# Patient Record
Sex: Female | Born: 1945
Health system: Southern US, Community
[De-identification: ages and names within clinical notes are randomized; demographics above are authoritative.]

## PROBLEM LIST (undated history)

## (undated) DIAGNOSIS — E785 Hyperlipidemia, unspecified: Secondary | ICD-10-CM

## (undated) DIAGNOSIS — G894 Chronic pain syndrome: Secondary | ICD-10-CM

## (undated) DIAGNOSIS — K635 Polyp of colon: Secondary | ICD-10-CM

## (undated) DIAGNOSIS — I119 Hypertensive heart disease without heart failure: Secondary | ICD-10-CM

## (undated) DIAGNOSIS — E559 Vitamin D deficiency, unspecified: Secondary | ICD-10-CM

## (undated) DIAGNOSIS — E213 Hyperparathyroidism, unspecified: Secondary | ICD-10-CM

## (undated) DIAGNOSIS — G4730 Sleep apnea, unspecified: Secondary | ICD-10-CM

## (undated) DIAGNOSIS — J309 Allergic rhinitis, unspecified: Secondary | ICD-10-CM

## (undated) DIAGNOSIS — J45909 Unspecified asthma, uncomplicated: Secondary | ICD-10-CM

## (undated) DIAGNOSIS — I10 Essential (primary) hypertension: Secondary | ICD-10-CM

## (undated) DIAGNOSIS — A048 Other specified bacterial intestinal infections: Secondary | ICD-10-CM

## (undated) HISTORY — DX: Polyp of colon: K63.5

## (undated) HISTORY — DX: Vitamin D deficiency, unspecified: E55.9

## (undated) HISTORY — DX: Sleep apnea, unspecified: G47.30

## (undated) HISTORY — DX: Hyperlipidemia, unspecified: E78.5

## (undated) HISTORY — DX: Other specified bacterial intestinal infections: A04.8

## (undated) HISTORY — DX: Morbid (severe) obesity due to excess calories: E66.01

## (undated) HISTORY — DX: Hyperparathyroidism, unspecified: E21.3

## (undated) HISTORY — DX: Chronic pain syndrome: G89.4

## (undated) HISTORY — DX: Hypertensive heart disease without heart failure: I11.9

## (undated) HISTORY — DX: Allergic rhinitis, unspecified: J30.9

## (undated) HISTORY — PX: JOINT REPLACEMENT: SHX530

## (undated) HISTORY — DX: Unspecified asthma, uncomplicated: J45.909

---

## 1998-09-10 ENCOUNTER — Other Ambulatory Visit: Admission: RE | Admit: 1998-09-10 | Discharge: 1998-09-10 | Payer: Self-pay

## 1999-08-27 ENCOUNTER — Encounter: Payer: Self-pay | Admitting: Urology

## 1999-09-03 ENCOUNTER — Inpatient Hospital Stay (HOSPITAL_COMMUNITY): Admission: RE | Admit: 1999-09-03 | Discharge: 1999-09-07 | Payer: Self-pay | Admitting: Orthopedic Surgery

## 1999-10-01 ENCOUNTER — Encounter: Admission: RE | Admit: 1999-10-01 | Discharge: 1999-11-26 | Payer: Self-pay | Admitting: Orthopedic Surgery

## 2000-12-31 ENCOUNTER — Ambulatory Visit (HOSPITAL_COMMUNITY): Admission: RE | Admit: 2000-12-31 | Discharge: 2000-12-31 | Payer: Self-pay | Admitting: Family Medicine

## 2000-12-31 ENCOUNTER — Encounter: Payer: Self-pay | Admitting: Family Medicine

## 2001-04-28 ENCOUNTER — Emergency Department (HOSPITAL_COMMUNITY): Admission: EM | Admit: 2001-04-28 | Discharge: 2001-04-28 | Payer: Self-pay | Admitting: Emergency Medicine

## 2001-04-28 ENCOUNTER — Encounter: Payer: Self-pay | Admitting: Emergency Medicine

## 2002-04-01 ENCOUNTER — Emergency Department (HOSPITAL_COMMUNITY): Admission: EM | Admit: 2002-04-01 | Discharge: 2002-04-01 | Payer: Self-pay | Admitting: *Deleted

## 2003-05-03 ENCOUNTER — Ambulatory Visit (HOSPITAL_COMMUNITY): Admission: RE | Admit: 2003-05-03 | Discharge: 2003-05-03 | Payer: Self-pay | Admitting: Family Medicine

## 2004-05-07 ENCOUNTER — Ambulatory Visit: Payer: Self-pay | Admitting: *Deleted

## 2004-06-10 ENCOUNTER — Ambulatory Visit: Payer: Self-pay | Admitting: Family Medicine

## 2004-07-29 ENCOUNTER — Ambulatory Visit: Payer: Self-pay | Admitting: Family Medicine

## 2004-09-02 ENCOUNTER — Ambulatory Visit: Payer: Self-pay | Admitting: Family Medicine

## 2004-09-08 ENCOUNTER — Ambulatory Visit: Payer: Self-pay | Admitting: Family Medicine

## 2004-09-29 ENCOUNTER — Ambulatory Visit: Payer: Self-pay | Admitting: Family Medicine

## 2004-10-06 ENCOUNTER — Ambulatory Visit: Payer: Self-pay | Admitting: Family Medicine

## 2004-10-13 ENCOUNTER — Encounter: Admission: RE | Admit: 2004-10-13 | Discharge: 2005-01-11 | Payer: Self-pay | Admitting: Family Medicine

## 2005-05-25 ENCOUNTER — Ambulatory Visit: Payer: Self-pay | Admitting: Family Medicine

## 2005-06-08 ENCOUNTER — Ambulatory Visit: Payer: Self-pay | Admitting: Family Medicine

## 2005-06-27 ENCOUNTER — Emergency Department (HOSPITAL_COMMUNITY): Admission: EM | Admit: 2005-06-27 | Discharge: 2005-06-27 | Payer: Self-pay | Admitting: Emergency Medicine

## 2005-07-13 ENCOUNTER — Ambulatory Visit: Payer: Self-pay | Admitting: Family Medicine

## 2005-07-14 ENCOUNTER — Ambulatory Visit (HOSPITAL_COMMUNITY): Admission: RE | Admit: 2005-07-14 | Discharge: 2005-07-14 | Payer: Self-pay | Admitting: *Deleted

## 2005-08-06 ENCOUNTER — Ambulatory Visit (HOSPITAL_COMMUNITY): Admission: RE | Admit: 2005-08-06 | Discharge: 2005-08-06 | Payer: Self-pay | Admitting: Family Medicine

## 2005-08-27 ENCOUNTER — Ambulatory Visit (HOSPITAL_COMMUNITY): Admission: RE | Admit: 2005-08-27 | Discharge: 2005-08-27 | Payer: Self-pay | Admitting: Chiropractic Medicine

## 2005-10-07 ENCOUNTER — Ambulatory Visit (HOSPITAL_COMMUNITY): Admission: RE | Admit: 2005-10-07 | Discharge: 2005-10-08 | Payer: Self-pay | Admitting: Orthopedic Surgery

## 2005-10-20 ENCOUNTER — Encounter: Admission: RE | Admit: 2005-10-20 | Discharge: 2006-01-07 | Payer: Self-pay | Admitting: Orthopedic Surgery

## 2005-12-28 ENCOUNTER — Ambulatory Visit: Payer: Self-pay | Admitting: Family Medicine

## 2006-02-15 ENCOUNTER — Ambulatory Visit: Payer: Self-pay | Admitting: Internal Medicine

## 2006-02-22 ENCOUNTER — Ambulatory Visit: Payer: Self-pay | Admitting: Internal Medicine

## 2006-04-30 ENCOUNTER — Ambulatory Visit: Payer: Self-pay | Admitting: *Deleted

## 2006-07-31 ENCOUNTER — Encounter: Payer: Self-pay | Admitting: Vascular Surgery

## 2006-07-31 ENCOUNTER — Emergency Department (HOSPITAL_COMMUNITY): Admission: EM | Admit: 2006-07-31 | Discharge: 2006-07-31 | Payer: Self-pay | Admitting: Emergency Medicine

## 2006-08-17 ENCOUNTER — Encounter (INDEPENDENT_AMBULATORY_CARE_PROVIDER_SITE_OTHER): Payer: Self-pay | Admitting: Family Medicine

## 2006-08-17 ENCOUNTER — Ambulatory Visit: Payer: Self-pay | Admitting: Family Medicine

## 2006-08-19 ENCOUNTER — Ambulatory Visit (HOSPITAL_COMMUNITY): Admission: RE | Admit: 2006-08-19 | Discharge: 2006-08-19 | Payer: Self-pay | Admitting: Family Medicine

## 2006-12-01 ENCOUNTER — Ambulatory Visit: Payer: Self-pay | Admitting: Internal Medicine

## 2007-04-05 ENCOUNTER — Ambulatory Visit: Payer: Self-pay | Admitting: Family Medicine

## 2007-04-11 DIAGNOSIS — E119 Type 2 diabetes mellitus without complications: Secondary | ICD-10-CM | POA: Insufficient documentation

## 2007-04-11 DIAGNOSIS — I10 Essential (primary) hypertension: Secondary | ICD-10-CM | POA: Insufficient documentation

## 2007-04-11 DIAGNOSIS — M159 Polyosteoarthritis, unspecified: Secondary | ICD-10-CM | POA: Insufficient documentation

## 2007-04-11 DIAGNOSIS — D508 Other iron deficiency anemias: Secondary | ICD-10-CM | POA: Insufficient documentation

## 2007-04-25 ENCOUNTER — Telehealth (INDEPENDENT_AMBULATORY_CARE_PROVIDER_SITE_OTHER): Payer: Self-pay | Admitting: Family Medicine

## 2007-05-18 ENCOUNTER — Encounter (INDEPENDENT_AMBULATORY_CARE_PROVIDER_SITE_OTHER): Payer: Self-pay | Admitting: *Deleted

## 2007-06-29 ENCOUNTER — Ambulatory Visit: Payer: Self-pay | Admitting: Family Medicine

## 2007-07-11 ENCOUNTER — Ambulatory Visit: Payer: Self-pay | Admitting: Family Medicine

## 2007-07-11 DIAGNOSIS — M5430 Sciatica, unspecified side: Secondary | ICD-10-CM | POA: Insufficient documentation

## 2007-07-11 LAB — CONVERTED CEMR LAB
Blood Glucose, Fingerstick: 268
Hgb A1c MFr Bld: 8.7 %

## 2007-07-26 ENCOUNTER — Telehealth (INDEPENDENT_AMBULATORY_CARE_PROVIDER_SITE_OTHER): Payer: Self-pay | Admitting: Family Medicine

## 2007-08-22 ENCOUNTER — Ambulatory Visit: Payer: Self-pay | Admitting: Family Medicine

## 2007-08-22 LAB — CONVERTED CEMR LAB
Blood Glucose, Fingerstick: 140
Blood in Urine, dipstick: NEGATIVE
Glucose, Urine, Semiquant: 100
Nitrite: NEGATIVE
Protein, U semiquant: 30
Specific Gravity, Urine: >=1.03
Urobilinogen, UA: 0.2
WBC Urine, dipstick: NEGATIVE
pH: 5

## 2007-08-23 LAB — CONVERTED CEMR LAB: Pap Smear: NORMAL

## 2007-09-07 ENCOUNTER — Ambulatory Visit (HOSPITAL_COMMUNITY): Admission: RE | Admit: 2007-09-07 | Discharge: 2007-09-07 | Payer: Self-pay | Admitting: Family Medicine

## 2007-09-09 ENCOUNTER — Encounter (INDEPENDENT_AMBULATORY_CARE_PROVIDER_SITE_OTHER): Payer: Self-pay | Admitting: Family Medicine

## 2007-10-24 ENCOUNTER — Ambulatory Visit: Payer: Self-pay | Admitting: Family Medicine

## 2007-10-24 LAB — CONVERTED CEMR LAB
ALT: 15 units/L (ref 0–35)
AST: 15 units/L (ref 0–37)
Albumin: 4.2 g/dL (ref 3.5–5.2)
Alkaline Phosphatase: 80 units/L (ref 39–117)
BUN: 17 mg/dL (ref 6–23)
Basophils Absolute: 0 10*3/uL (ref 0.0–0.1)
Basophils Relative: 1 % (ref 0–1)
CO2: 23 meq/L (ref 19–32)
Calcium: 9.8 mg/dL (ref 8.4–10.5)
Chloride: 105 meq/L (ref 96–112)
Cholesterol: 129 mg/dL (ref 0–200)
Creatinine, Ser: 0.7 mg/dL (ref 0.40–1.20)
Eosinophils Absolute: 0.2 10*3/uL (ref 0.0–0.7)
Eosinophils Relative: 3 % (ref 0–5)
Glucose, Bld: 91 mg/dL (ref 70–99)
HCT: 38.4 % (ref 36.0–46.0)
HDL: 43 mg/dL (ref >39)
Hemoglobin: 11.6 g/dL — ABNORMAL LOW (ref 12.0–15.0)
LDL Cholesterol: 67 mg/dL (ref 0–99)
Lymphocytes Relative: 40 % (ref 12–46)
Lymphs Abs: 2.5 10*3/uL (ref 0.7–4.0)
MCHC: 30.2 g/dL (ref 30.0–36.0)
MCV: 82.2 fL (ref 78.0–100.0)
Monocytes Absolute: 0.5 10*3/uL (ref 0.1–1.0)
Monocytes Relative: 8 % (ref 3–12)
Neutro Abs: 3.1 10*3/uL (ref 1.7–7.7)
Neutrophils Relative %: 49 % (ref 43–77)
Platelets: 273 10*3/uL (ref 150–400)
Potassium: 4.2 meq/L (ref 3.5–5.3)
RBC: 4.67 M/uL (ref 3.87–5.11)
RDW: 15.1 % (ref 11.5–15.5)
Sodium: 142 meq/L (ref 135–145)
Total Bilirubin: 0.4 mg/dL (ref 0.3–1.2)
Total CHOL/HDL Ratio: 3
Total Protein: 7.7 g/dL (ref 6.0–8.3)
Triglycerides: 97 mg/dL (ref <150)
VLDL: 19 mg/dL (ref 0–40)
WBC: 6.3 10*3/uL (ref 4.0–10.5)

## 2007-11-09 ENCOUNTER — Telehealth (INDEPENDENT_AMBULATORY_CARE_PROVIDER_SITE_OTHER): Payer: Self-pay | Admitting: Family Medicine

## 2008-01-25 ENCOUNTER — Telehealth (INDEPENDENT_AMBULATORY_CARE_PROVIDER_SITE_OTHER): Payer: Self-pay | Admitting: Family Medicine

## 2008-08-05 ENCOUNTER — Emergency Department (HOSPITAL_COMMUNITY): Admission: EM | Admit: 2008-08-05 | Discharge: 2008-08-05 | Payer: Self-pay | Admitting: Emergency Medicine

## 2008-09-03 ENCOUNTER — Ambulatory Visit (HOSPITAL_COMMUNITY): Admission: RE | Admit: 2008-09-03 | Discharge: 2008-09-03 | Payer: Self-pay | Admitting: Orthopedic Surgery

## 2008-10-04 ENCOUNTER — Telehealth (INDEPENDENT_AMBULATORY_CARE_PROVIDER_SITE_OTHER): Payer: Self-pay | Admitting: Family Medicine

## 2008-10-22 ENCOUNTER — Ambulatory Visit: Payer: Self-pay | Admitting: Family Medicine

## 2008-10-22 LAB — CONVERTED CEMR LAB
ALT: 14 U/L (ref 0–35)
AST: 15 U/L (ref 0–37)
Albumin: 3.7 g/dL (ref 3.5–5.2)
Alkaline Phosphatase: 107 U/L (ref 39–117)
BUN: 18 mg/dL (ref 6–23)
Basophils Absolute: 0 K/uL (ref 0.0–0.1)
Basophils Relative: 1 % (ref 0–1)
Blood Glucose, Fingerstick: 141
Blood in Urine, dipstick: NEGATIVE
CO2: 20 meq/L (ref 19–32)
Calcium: 9 mg/dL (ref 8.4–10.5)
Chloride: 106 meq/L (ref 96–112)
Creatinine, Ser: 0.82 mg/dL (ref 0.40–1.20)
Eosinophils Absolute: 0.1 K/uL (ref 0.0–0.7)
Eosinophils Relative: 2 % (ref 0–5)
Glucose, Bld: 98 mg/dL (ref 70–99)
Glucose, Urine, Semiquant: 100
HCT: 36.6 % (ref 36.0–46.0)
Hemoglobin: 11.3 g/dL — ABNORMAL LOW (ref 12.0–15.0)
Hgb A1c MFr Bld: 10.5 %
Lymphocytes Relative: 39 % (ref 12–46)
Lymphs Abs: 2.7 K/uL (ref 0.7–4.0)
MCHC: 30.9 g/dL (ref 30.0–36.0)
MCV: 81.9 fL (ref 78.0–100.0)
Monocytes Absolute: 0.6 K/uL (ref 0.1–1.0)
Monocytes Relative: 9 % (ref 3–12)
Neutro Abs: 3.5 K/uL (ref 1.7–7.7)
Neutrophils Relative %: 50 % (ref 43–77)
Nitrite: NEGATIVE
Platelets: 262 K/uL (ref 150–400)
Potassium: 4.4 meq/L (ref 3.5–5.3)
Protein, U semiquant: 100
RBC: 4.47 M/uL (ref 3.87–5.11)
RDW: 14.8 % (ref 11.5–15.5)
Sodium: 142 meq/L (ref 135–145)
Specific Gravity, Urine: >=1.03
TSH: 1.54 u[IU]/mL (ref 0.350–4.50)
Total Bilirubin: 0.3 mg/dL (ref 0.3–1.2)
Total Protein: 7.4 g/dL (ref 6.0–8.3)
Urobilinogen, UA: 0.2
WBC Urine, dipstick: NEGATIVE
WBC: 7 10*3/microliter (ref 4.0–10.5)
pH: 5

## 2008-10-23 LAB — CONVERTED CEMR LAB: Microalb, Ur: 4.09 mg/dL — ABNORMAL HIGH (ref 0.00–1.89)

## 2008-10-29 ENCOUNTER — Ambulatory Visit: Payer: Self-pay | Admitting: Family Medicine

## 2008-10-29 LAB — CONVERTED CEMR LAB
Blood Glucose, Fingerstick: 174
Hgb A1c MFr Bld: 10.7 %

## 2008-11-01 ENCOUNTER — Ambulatory Visit (HOSPITAL_COMMUNITY): Admission: RE | Admit: 2008-11-01 | Discharge: 2008-11-01 | Payer: Self-pay | Admitting: Family Medicine

## 2008-11-01 ENCOUNTER — Encounter (INDEPENDENT_AMBULATORY_CARE_PROVIDER_SITE_OTHER): Payer: Self-pay | Admitting: Family Medicine

## 2008-11-06 ENCOUNTER — Encounter (INDEPENDENT_AMBULATORY_CARE_PROVIDER_SITE_OTHER): Payer: Self-pay | Admitting: Family Medicine

## 2008-11-08 ENCOUNTER — Ambulatory Visit: Payer: Self-pay | Admitting: Family Medicine

## 2008-11-09 ENCOUNTER — Telehealth (INDEPENDENT_AMBULATORY_CARE_PROVIDER_SITE_OTHER): Payer: Self-pay | Admitting: Family Medicine

## 2008-11-16 ENCOUNTER — Telehealth (INDEPENDENT_AMBULATORY_CARE_PROVIDER_SITE_OTHER): Payer: Self-pay | Admitting: Family Medicine

## 2008-11-22 ENCOUNTER — Ambulatory Visit: Payer: Self-pay | Admitting: Family Medicine

## 2008-11-27 ENCOUNTER — Encounter (INDEPENDENT_AMBULATORY_CARE_PROVIDER_SITE_OTHER): Payer: Self-pay | Admitting: Family Medicine

## 2008-12-17 ENCOUNTER — Ambulatory Visit: Payer: Self-pay | Admitting: Family Medicine

## 2008-12-17 DIAGNOSIS — R9431 Abnormal electrocardiogram [ECG] [EKG]: Secondary | ICD-10-CM | POA: Insufficient documentation

## 2008-12-17 LAB — CONVERTED CEMR LAB: Blood Glucose, Fingerstick: 114

## 2008-12-18 ENCOUNTER — Ambulatory Visit (HOSPITAL_COMMUNITY): Admission: RE | Admit: 2008-12-18 | Discharge: 2008-12-18 | Payer: Self-pay | Admitting: Family Medicine

## 2008-12-18 ENCOUNTER — Encounter (INDEPENDENT_AMBULATORY_CARE_PROVIDER_SITE_OTHER): Payer: Self-pay | Admitting: Family Medicine

## 2008-12-18 LAB — CONVERTED CEMR LAB
Cholesterol: 128 mg/dL (ref 0–200)
HDL: 43 mg/dL (ref >39)
LDL Cholesterol: 71 mg/dL (ref 0–99)
Total CHOL/HDL Ratio: 3
Triglycerides: 72 mg/dL (ref <150)
VLDL: 14 mg/dL (ref 0–40)

## 2008-12-19 ENCOUNTER — Telehealth (INDEPENDENT_AMBULATORY_CARE_PROVIDER_SITE_OTHER): Payer: Self-pay | Admitting: Family Medicine

## 2008-12-24 ENCOUNTER — Ambulatory Visit: Payer: Self-pay | Admitting: Cardiovascular Disease

## 2008-12-24 DIAGNOSIS — I10 Essential (primary) hypertension: Secondary | ICD-10-CM | POA: Insufficient documentation

## 2008-12-27 ENCOUNTER — Encounter: Payer: Self-pay | Admitting: Cardiovascular Disease

## 2008-12-27 ENCOUNTER — Ambulatory Visit: Payer: Self-pay

## 2008-12-28 ENCOUNTER — Telehealth (INDEPENDENT_AMBULATORY_CARE_PROVIDER_SITE_OTHER): Payer: Self-pay | Admitting: Family Medicine

## 2009-01-03 ENCOUNTER — Telehealth: Payer: Self-pay | Admitting: Cardiovascular Disease

## 2009-01-03 ENCOUNTER — Encounter (INDEPENDENT_AMBULATORY_CARE_PROVIDER_SITE_OTHER): Payer: Self-pay | Admitting: *Deleted

## 2009-01-21 ENCOUNTER — Ambulatory Visit: Payer: Self-pay | Admitting: Family Medicine

## 2009-01-21 LAB — CONVERTED CEMR LAB
Blood Glucose, Fingerstick: 81
Hgb A1c MFr Bld: 7.4 %

## 2009-03-25 ENCOUNTER — Encounter (INDEPENDENT_AMBULATORY_CARE_PROVIDER_SITE_OTHER): Payer: Self-pay | Admitting: Internal Medicine

## 2009-04-08 ENCOUNTER — Encounter (INDEPENDENT_AMBULATORY_CARE_PROVIDER_SITE_OTHER): Payer: Self-pay | Admitting: Nurse Practitioner

## 2009-04-08 ENCOUNTER — Ambulatory Visit: Payer: Self-pay | Admitting: Physician Assistant

## 2009-04-08 DIAGNOSIS — E785 Hyperlipidemia, unspecified: Secondary | ICD-10-CM | POA: Insufficient documentation

## 2009-04-08 DIAGNOSIS — B351 Tinea unguium: Secondary | ICD-10-CM | POA: Insufficient documentation

## 2009-04-08 DIAGNOSIS — R609 Edema, unspecified: Secondary | ICD-10-CM | POA: Insufficient documentation

## 2009-04-08 LAB — CONVERTED CEMR LAB
Blood Glucose, Fingerstick: 104
Hgb A1c MFr Bld: 6.7 %

## 2009-04-09 ENCOUNTER — Encounter: Payer: Self-pay | Admitting: Physician Assistant

## 2009-04-10 ENCOUNTER — Encounter (INDEPENDENT_AMBULATORY_CARE_PROVIDER_SITE_OTHER): Payer: Self-pay | Admitting: Family Medicine

## 2009-04-10 ENCOUNTER — Ambulatory Visit: Payer: Self-pay | Admitting: Internal Medicine

## 2009-04-10 ENCOUNTER — Ambulatory Visit: Payer: Self-pay | Admitting: Physician Assistant

## 2009-04-15 ENCOUNTER — Encounter: Payer: Self-pay | Admitting: Physician Assistant

## 2009-04-25 ENCOUNTER — Encounter: Payer: Self-pay | Admitting: Physician Assistant

## 2009-04-29 ENCOUNTER — Telehealth: Payer: Self-pay | Admitting: Physician Assistant

## 2009-04-29 DIAGNOSIS — E1139 Type 2 diabetes mellitus with other diabetic ophthalmic complication: Secondary | ICD-10-CM | POA: Insufficient documentation

## 2009-09-04 ENCOUNTER — Telehealth: Payer: Self-pay | Admitting: Physician Assistant

## 2009-09-13 ENCOUNTER — Ambulatory Visit: Payer: Self-pay | Admitting: Physician Assistant

## 2009-09-15 LAB — CONVERTED CEMR LAB
ALT: 15 units/L (ref 0–35)
AST: 14 units/L (ref 0–37)
Albumin: 4 g/dL (ref 3.5–5.2)
Alkaline Phosphatase: 94 units/L (ref 39–117)
BUN: 15 mg/dL (ref 6–23)
CO2: 20 meq/L (ref 19–32)
Calcium: 9.3 mg/dL (ref 8.4–10.5)
Chloride: 104 meq/L (ref 96–112)
Creatinine, Ser: 0.63 mg/dL (ref 0.40–1.20)
Glucose, Bld: 137 mg/dL — ABNORMAL HIGH (ref 70–99)
Hgb A1c MFr Bld: 8.2 % — ABNORMAL HIGH (ref 4.6–6.1)
Potassium: 4.3 meq/L (ref 3.5–5.3)
Sodium: 139 meq/L (ref 135–145)
Total Bilirubin: 0.3 mg/dL (ref 0.3–1.2)
Total Protein: 7.7 g/dL (ref 6.0–8.3)

## 2009-09-24 ENCOUNTER — Telehealth: Payer: Self-pay | Admitting: Physician Assistant

## 2009-09-24 ENCOUNTER — Ambulatory Visit: Payer: Self-pay | Admitting: Physician Assistant

## 2009-09-24 DIAGNOSIS — K029 Dental caries, unspecified: Secondary | ICD-10-CM | POA: Insufficient documentation

## 2009-09-24 LAB — CONVERTED CEMR LAB
Blood Glucose, Fingerstick: 120
Cholesterol, target level: 200 mg/dL
HDL goal, serum: 40 mg/dL
Hgb A1c MFr Bld: 7.6 %
LDL Goal: 100 mg/dL

## 2009-09-25 LAB — CONVERTED CEMR LAB: Microalb, Ur: 2.71 mg/dL — ABNORMAL HIGH (ref 0.00–1.89)

## 2009-10-16 ENCOUNTER — Telehealth: Payer: Self-pay | Admitting: Physician Assistant

## 2009-10-17 ENCOUNTER — Telehealth: Payer: Self-pay | Admitting: Physician Assistant

## 2009-11-15 ENCOUNTER — Telehealth: Payer: Self-pay | Admitting: Physician Assistant

## 2009-11-18 ENCOUNTER — Telehealth: Payer: Self-pay | Admitting: Physician Assistant

## 2009-12-10 ENCOUNTER — Telehealth: Payer: Self-pay | Admitting: Physician Assistant

## 2009-12-13 ENCOUNTER — Telehealth: Payer: Self-pay | Admitting: Physician Assistant

## 2009-12-23 ENCOUNTER — Ambulatory Visit: Payer: Self-pay | Admitting: Physician Assistant

## 2009-12-23 LAB — CONVERTED CEMR LAB
Bilirubin Urine: NEGATIVE
Blood Glucose, Fingerstick: 113
Blood in Urine, dipstick: NEGATIVE
Glucose, Urine, Semiquant: 100
Hgb A1c MFr Bld: 6.8 %
KOH Prep: NEGATIVE
Ketones, urine, test strip: NEGATIVE
Nitrite: NEGATIVE
OCCULT 1: NEGATIVE
Pap Smear: NEGATIVE
Rapid HIV Screen: NEGATIVE
Specific Gravity, Urine: >=1.03
Urobilinogen, UA: 0.2
WBC Urine, dipstick: NEGATIVE
Whiff Test: NEGATIVE
pH: 5

## 2009-12-24 ENCOUNTER — Encounter: Payer: Self-pay | Admitting: Physician Assistant

## 2009-12-25 ENCOUNTER — Encounter: Payer: Self-pay | Admitting: Physician Assistant

## 2009-12-25 LAB — CONVERTED CEMR LAB
ALT: 15 units/L (ref 0–35)
AST: 16 units/L (ref 0–37)
Albumin: 4.1 g/dL (ref 3.5–5.2)
Alkaline Phosphatase: 91 units/L (ref 39–117)
BUN: 21 mg/dL (ref 6–23)
Basophils Absolute: 0 10*3/uL (ref 0.0–0.1)
Basophils Relative: 1 % (ref 0–1)
CO2: 21 meq/L (ref 19–32)
Calcium: 9.5 mg/dL (ref 8.4–10.5)
Chloride: 105 meq/L (ref 96–112)
Creatinine, Ser: 0.82 mg/dL (ref 0.40–1.20)
Eosinophils Absolute: 0.2 10*3/uL (ref 0.0–0.7)
Eosinophils Relative: 2 % (ref 0–5)
Glucose, Bld: 108 mg/dL — ABNORMAL HIGH (ref 70–99)
HCT: 38.9 % (ref 36.0–46.0)
Hemoglobin: 11.9 g/dL — ABNORMAL LOW (ref 12.0–15.0)
Lymphocytes Relative: 34 % (ref 12–46)
Lymphs Abs: 2.7 10*3/uL (ref 0.7–4.0)
MCHC: 30.6 g/dL (ref 30.0–36.0)
MCV: 83.5 fL (ref 78.0–100.0)
Monocytes Absolute: 0.5 10*3/uL (ref 0.1–1.0)
Monocytes Relative: 7 % (ref 3–12)
Neutro Abs: 4.5 10*3/uL (ref 1.7–7.7)
Neutrophils Relative %: 57 % (ref 43–77)
Platelets: 291 10*3/uL (ref 150–400)
Potassium: 4.5 meq/L (ref 3.5–5.3)
RBC: 4.66 M/uL (ref 3.87–5.11)
RDW: 15.8 % — ABNORMAL HIGH (ref 11.5–15.5)
Sodium: 141 meq/L (ref 135–145)
TSH: 1.189 microintl units/mL (ref 0.350–4.500)
Total Bilirubin: 0.3 mg/dL (ref 0.3–1.2)
Total Protein: 7.7 g/dL (ref 6.0–8.3)
WBC: 7.9 10*3/uL (ref 4.0–10.5)

## 2010-01-07 ENCOUNTER — Ambulatory Visit (HOSPITAL_COMMUNITY): Admission: RE | Admit: 2010-01-07 | Discharge: 2010-01-07 | Payer: Self-pay | Admitting: Internal Medicine

## 2010-01-13 ENCOUNTER — Ambulatory Visit (HOSPITAL_COMMUNITY): Admission: RE | Admit: 2010-01-13 | Discharge: 2010-01-13 | Payer: Self-pay | Admitting: Internal Medicine

## 2010-01-15 ENCOUNTER — Telehealth: Payer: Self-pay | Admitting: Physician Assistant

## 2010-01-16 ENCOUNTER — Encounter (INDEPENDENT_AMBULATORY_CARE_PROVIDER_SITE_OTHER): Payer: Self-pay | Admitting: *Deleted

## 2010-01-17 ENCOUNTER — Ambulatory Visit: Payer: Self-pay | Admitting: Physician Assistant

## 2010-01-19 LAB — CONVERTED CEMR LAB
Cholesterol: 120 mg/dL (ref 0–200)
HDL: 40 mg/dL (ref >39)
LDL Cholesterol: 49 mg/dL (ref 0–99)
Total CHOL/HDL Ratio: 3
Triglycerides: 153 mg/dL — ABNORMAL HIGH (ref <150)
VLDL: 31 mg/dL (ref 0–40)

## 2010-01-20 ENCOUNTER — Telehealth (INDEPENDENT_AMBULATORY_CARE_PROVIDER_SITE_OTHER): Payer: Self-pay | Admitting: *Deleted

## 2010-01-22 ENCOUNTER — Encounter: Payer: Self-pay | Admitting: Physician Assistant

## 2010-01-24 ENCOUNTER — Telehealth (INDEPENDENT_AMBULATORY_CARE_PROVIDER_SITE_OTHER): Payer: Self-pay | Admitting: *Deleted

## 2010-02-10 ENCOUNTER — Encounter: Payer: Self-pay | Admitting: Physician Assistant

## 2010-02-10 DIAGNOSIS — M1990 Unspecified osteoarthritis, unspecified site: Secondary | ICD-10-CM | POA: Insufficient documentation

## 2010-02-12 ENCOUNTER — Telehealth: Payer: Self-pay | Admitting: Physician Assistant

## 2010-02-12 ENCOUNTER — Encounter: Payer: Self-pay | Admitting: Physician Assistant

## 2010-02-14 ENCOUNTER — Telehealth: Payer: Self-pay | Admitting: Physician Assistant

## 2010-02-17 ENCOUNTER — Telehealth (INDEPENDENT_AMBULATORY_CARE_PROVIDER_SITE_OTHER): Payer: Self-pay | Admitting: *Deleted

## 2010-03-04 ENCOUNTER — Emergency Department (HOSPITAL_COMMUNITY): Admission: EM | Admit: 2010-03-04 | Discharge: 2010-03-04 | Payer: Self-pay | Admitting: Emergency Medicine

## 2010-03-24 ENCOUNTER — Telehealth: Payer: Self-pay | Admitting: Physician Assistant

## 2010-03-24 ENCOUNTER — Ambulatory Visit: Payer: Self-pay | Admitting: Physician Assistant

## 2010-03-24 DIAGNOSIS — J309 Allergic rhinitis, unspecified: Secondary | ICD-10-CM | POA: Insufficient documentation

## 2010-03-24 LAB — CONVERTED CEMR LAB
Blood Glucose, Fingerstick: 331
Hgb A1c MFr Bld: 11.1 %

## 2010-04-10 ENCOUNTER — Encounter: Payer: Self-pay | Admitting: Physician Assistant

## 2010-04-11 ENCOUNTER — Encounter: Payer: Self-pay | Admitting: Physician Assistant

## 2010-04-21 ENCOUNTER — Ambulatory Visit: Payer: Self-pay | Admitting: Physician Assistant

## 2010-04-21 ENCOUNTER — Telehealth: Payer: Self-pay | Admitting: Physician Assistant

## 2010-04-21 LAB — CONVERTED CEMR LAB: Blood Glucose, Fingerstick: 230

## 2010-04-23 ENCOUNTER — Telehealth: Payer: Self-pay | Admitting: Physician Assistant

## 2010-04-29 ENCOUNTER — Ambulatory Visit: Payer: Self-pay | Admitting: Physician Assistant

## 2010-04-29 DIAGNOSIS — M6530 Trigger finger, unspecified finger: Secondary | ICD-10-CM | POA: Insufficient documentation

## 2010-05-06 ENCOUNTER — Encounter: Payer: Self-pay | Admitting: Physician Assistant

## 2010-05-13 ENCOUNTER — Ambulatory Visit: Payer: Self-pay | Admitting: Physician Assistant

## 2010-05-14 ENCOUNTER — Ambulatory Visit (HOSPITAL_COMMUNITY)
Admission: RE | Admit: 2010-05-14 | Discharge: 2010-05-14 | Payer: Self-pay | Source: Home / Self Care | Admitting: Gastroenterology

## 2010-05-14 ENCOUNTER — Encounter: Payer: Self-pay | Admitting: Physician Assistant

## 2010-05-16 ENCOUNTER — Encounter: Payer: Self-pay | Admitting: Physician Assistant

## 2010-05-16 ENCOUNTER — Ambulatory Visit: Payer: Self-pay | Admitting: Internal Medicine

## 2010-05-19 ENCOUNTER — Encounter: Payer: Self-pay | Admitting: Physician Assistant

## 2010-05-23 ENCOUNTER — Telehealth: Payer: Self-pay | Admitting: Physician Assistant

## 2010-06-18 ENCOUNTER — Ambulatory Visit: Payer: Self-pay | Admitting: Internal Medicine

## 2010-06-23 ENCOUNTER — Telehealth (INDEPENDENT_AMBULATORY_CARE_PROVIDER_SITE_OTHER): Payer: Self-pay | Admitting: Nurse Practitioner

## 2010-07-01 ENCOUNTER — Ambulatory Visit: Payer: Self-pay | Admitting: Internal Medicine

## 2010-07-28 ENCOUNTER — Encounter (INDEPENDENT_AMBULATORY_CARE_PROVIDER_SITE_OTHER): Payer: Self-pay | Admitting: Internal Medicine

## 2010-07-30 ENCOUNTER — Telehealth: Payer: Self-pay | Admitting: Physician Assistant

## 2010-09-02 ENCOUNTER — Telehealth (INDEPENDENT_AMBULATORY_CARE_PROVIDER_SITE_OTHER): Payer: Self-pay | Admitting: Internal Medicine

## 2010-09-20 ENCOUNTER — Encounter: Payer: Self-pay | Admitting: Occupational Therapy

## 2010-09-21 ENCOUNTER — Encounter: Payer: Self-pay | Admitting: Occupational Therapy

## 2010-09-29 ENCOUNTER — Telehealth (INDEPENDENT_AMBULATORY_CARE_PROVIDER_SITE_OTHER): Payer: Self-pay | Admitting: Internal Medicine

## 2010-09-30 NOTE — Progress Notes (Signed)
Summary: CALLING IN FOR HER PAIN MEDS  Phone Note Call from Patient Call back at Home Phone [phone]   Reason for Call: Refill Medication Summary of Call: Katelyn [name] PT. MS [name] IS CALLING IN FOR HER PAIN MEDICATION THAT WILL BE DUE SOON. Initial call taken by: Leodis Rains,  Jan 15, 2010 10:47 AM  Follow-up for Phone Call        forward to provider, last filled on 12-13-09 Follow-up by: [name],  Jan 15, 2010 12:18 PM  Additional Follow-up for Phone Call Additional follow up Details #1::        Rx in basket to fax. Additional Follow-up by: Tereso Newcomer PA-C,  Jan 15, 2010 1:35 PM    Additional Follow-up for Phone Call Additional follow up Details #2::    number is disconnected.... [name]  Jan 15, 2010 4:58 PM  number is disconnected.... will mail letter... [name]  Jan 16, 2010 11:33 AM    Prescriptions: PERCOCET 5-325 MG TABS (OXYCODONE-ACETAMINOPHEN) 1-2 tablets by mouth up to every  8 hours as needed for severe pain  #60 x 0   Entered and Authorized by:   Tereso Newcomer PA-C   Signed by:   Tereso Newcomer PA-C on 01/15/2010   Method used:   Printed then faxed to ...       [facility] - [facility] (retail)       [address] [place], [place]  [address]       Ph: [phone]       Fax: [phone]   RxID:   [id number]

## 2010-09-30 NOTE — Miscellaneous (Signed)
Summary: Has appt with ortho 7.14.2011  Clinical Lists Changes  Problems: Added new problem of OSTEOARTHRITIS (ICD-715.90) Observations: Added new observation of PAST MED HX: diabetes Hypertension Left TKR R rotator cuff surger Abnormal ECG Osteoarthritis   a.  right knee:  eval appt with Dr. Lequita Halt 7.14.2011 (02/10/2010 5:48) Added new observation of PMHOSTEOARTH: yes (02/10/2010 5:48)       Past History:  Past Medical History: diabetes Hypertension Left TKR R rotator cuff surger Abnormal ECG Osteoarthritis   a.  right knee:  eval appt with Dr. Lequita Halt 7.14.2011

## 2010-09-30 NOTE — Progress Notes (Signed)
Summary: GI referral update  Phone Note Outgoing Call   Summary of Call: Katelyn [name] has GI referral in cue for screening colo.  Can we try to get her on list for Dr. Corinda Gubler? Initial call taken by: Brynda Rim,  March 24, 2010 11:22 AM  Follow-up for Phone Call        Called and lt message on Pt's answering machine to offer her an appt, but call was never returned Follow-up by: Candi Leash,  March 24, 2010 11:49 AM  Additional Follow-up for Phone Call Additional follow up Details #1::        [place], Please call [name] and see if we can get her on list for Dr. Corinda Gubler.  She was open to seeing him when I saw her yest. Additional Follow-up by: Brynda Rim,  March 25, 2010 5:43 AM    Additional Follow-up for Phone Call Additional follow up Details #2::    spoke with pt and gave her debra number to speak with her about her referral Follow-up by: [name],  March 25, 2010 10:27 AM

## 2010-09-30 NOTE — Progress Notes (Signed)
Summary: fax surgical clearance information  Phone Note From Other Clinic Call back at [phone]   Caller: [facility] Summary of Call: fax surgical clearance to [facility] fax [phone] attn: [place] Initial call taken by: Burnard Leigh,  Jan 03, 2009 10:48 AM  Follow-up for Phone Call        clearence note faxed Deliah Goody, RN  Jan 03, 2009 11:55 AM

## 2010-09-30 NOTE — Progress Notes (Signed)
  Phone Note From Other Clinic   Summary of Call: pt has a referral for ortho in system... pt would like for you to call her to let her know the prices before you make appt Initial call taken by: [name],  Jan 20, 2010 4:57 PM

## 2010-09-30 NOTE — Progress Notes (Signed)
Summary: NEEDS MEDICAL CLARENCE/Surgical Clearance denied for now  Phone Note Call from Patient Call back at Home Phone [phone]   Summary of Call: Katelyn [name] PT. Katelyn LOBISSERE WANTS TO KNOW IF YOU HAVE GIVEN MEDICAL CLARENCE FOR HER TO HAVE SURGERY W/DR. JEFFE ON HER SHOULDER. Initial call taken by: Leodis Rains,  November 16, 2008 4:46 PM  Follow-up for Phone Call        will forward to Dr Barbaraann Barthel to follow up with after she has reviewed CBG readings and EKG. The surgery is not set yet. Follow-up by: Mikey College CMA,  November 16, 2008 4:56 PM  Additional Follow-up for Phone Call Additional follow up Details #1::        BS look good for surgery but EKG had a few changes that I would like to see if are old(I suspect they are as this was routine baseline EKG). Have you ever had an EKG prior to the 11/08/08 one? If so where and we will try to get it. In meantime set up appt to see me in 2 weeks just to check this out.Marland KitchenMarland KitchenI am sorry to make you wait on surgery but I want to make sure that you are in top shape first...good job with sugars though! Additional Follow-up by: Beverley Fiedler MD,  November 21, 2008 7:44 PM    Additional Follow-up for Phone Call Additional follow up Details #2::    left message to call and ask for [place] or [place]..............Marland KitchenMikey College CMA  November 23, 2008 11:30 AM   Today, Katelyn [name] went to Dr Tinnie Gens, and his office needs a hand written from Dr Barbaraann Barthel that states that she is okay to do a surgery. Marland KitchenManon Hilding  November 23, 2008 3:37 PM   spoke with pt and she is coming in to get another EKG  Follow-up by: [name],  November 23, 2008 3:46 PM

## 2010-09-30 NOTE — Letter (Signed)
Summary: Clearance Letter  [facility], Main Office  [address]   [place], [place] [address]   Phone: [phone]  Fax: [phone]    Jan 03, 2009  Re:     Katelyn [name] Address:   [address]     [place], [place]  [address] DOB:     1946-05-15 MRN:     [id number]   Dear Dr. Everlena Cooper,        Katelyn [name] is considered low risk cardiac wise for surgery. Please contact us with questions or concerns.           Sincerely,  Deliah Goody, RN/Dr. Charlton Haws

## 2010-09-30 NOTE — Progress Notes (Signed)
  Phone Note Call from Patient Call back at [facility] Phone [phone]   Summary of Call: The pt states that the tramadol medication which could be the generic version make her sick (nausea and drouzey) The previous medication was round like a football and the one she got now is different. The pt states that the doze is similar to the previous one.  [facility]) Virginia Francisco PA-C  Initial call taken by: Manon Hilding,  October 16, 2009 11:53 AM  Follow-up for Phone Call        spoke with pt and she let me know that she has been sick since she has been taking the tramadol... pt says the new med looks alot different from the one she has recently taken.... spoke with pharmacy and Burna Mortimer let me know that the med is from another company and thats why its different and it could be making her sick.... spoke with pt and she is aware and will call pharmacy to have them to transferr her med to different pharmacy Follow-up by: [name],  October 16, 2009 3:25 PM

## 2010-09-30 NOTE — Letter (Signed)
Summary: MAILED REQUESTED RECORDS TO [employer]  MAILED REQUESTED RECORDS TO [employer]   Imported By: Arta Bruce 05/19/2010 14:39:17  _____________________________________________________________________  External Attachment:    Type:   Image     Comment:   External Document

## 2010-09-30 NOTE — Progress Notes (Signed)
Summary: Voc Rehab referral  Phone Note From Other Clinic   Caller: Referral Coordinator Summary of Call: Spoke to [facility] @ [phone]   Pt must call to state that she would like to apply for services. Then they would  take an application & set up an orientation. I called Ms.Nare and informed her of this process. She said that she would give tham a call to start the process. Initial call taken by: Candi Leash,  February 14, 2010 1:24 PM  Follow-up for Phone Call        Thanks Follow-up by: Tereso Newcomer PA-C,  February 14, 2010 1:26 PM

## 2010-09-30 NOTE — Progress Notes (Signed)
Summary: Multiple issues/needsto see MD  Phone Note Call from Patient Call back at Home Phone [phone] Call back at [phone]   Caller: Patient Action Taken: Rx Called In Summary of Call: The pt wants to leave a message about her probably surgery with Dr. Everlena Cooper; according to her she have done all the required test an eveything is normal.  The pt said that the physician needs to send a note to Dr Everlena Cooper  that states everything is okay  to have the surgery. Barbaraann Barthel Md.  Initial call taken by: Manon Hilding,  December 28, 2008 4:13 PM  Follow-up for Phone Call        spoke with pt and she said she did all the test Dr. Barbaraann Barthel wanted from her to make sure she was in the clear for the surgery and she said the echo and ekg was normal..... if she is clear can you fax the documents to Dr. Everlena Cooper. Follow-up by: [name],  December 28, 2008 5:48 PM  Additional Follow-up for Phone Call Additional follow up Details #1::        It's Dr.Jeffries. I need to see The cardiologist's evaluation and surgical clearance...please get records. Additional Follow-up by: Beverley Fiedler MD,  Dec 30, 2008 7:21 PM    Additional Follow-up for Phone Call Additional follow up Details #2::    MS Hernon SASY THAT SHE NOW NEEDS A CLEARANCE LETTER SENT TO DR.JAFFE  FOR HER TO HAVE HER SURGERY AS SOON AS POSIBLE. HIS OFFICE NUMBER IS [phone].  ALSO SHE WANTED TO LET YOU KNOW THAT HER LISNOPRIL MAKES HER SWEAT VERY BAD AND SHE EXPERIENCES SHORTNESS OF BREATH. SHE SAYS SHE WENT OUT OF TOWN RECENTLY AND FORGOT THEM, AND THE WHOLE TIME SHE WAS WITHOUT THEM, SHE HAD NO SWEATS NOR SHORTNESS OF BREATH. SHE SAYS THAT SHE TOOK A 1/2 PILL AND ONLY SWEATED A LITTLE AND SHORTNESS OF BREATH.Marland KitchenMarland KitchenCala Bradford Tinnin  Dec 31, 2008 3:08 PM   Additional Follow-up for Phone Call Additional follow up Details #3:: Details for Additional Follow-up Action Taken: Left message on answering machine for pt to call back....Marland KitchenMarland KitchenArmenia Shannon   Dec 31, 2008 5:41 PM  Left message with a lady in the house to have pt return our call..........Marland KitchenArmenia Shannon  Jan 03, 2009 9:16 AM   the information is on your counter......[name] pt is concerned about BP med.   she says she only takes a 1/2 a pill cause she starts sweating.... she said one day she forgot and she did not break out sweating none that day................Marland KitchenArmenia Shannon  Jan 03, 2009 9:56 AM   I have received the letter from Dr.Nishan stating that patient is low cardiac risk.She is also doing well from standpoint of her DM.Marland KitchenMarland KitchenHowever,her BP needs to be controlled and lisinopril should not cause her to sweat or feel SOB....BUT she could feel this way if her blood sugar was too low or dropping and she should check her CBGs when she feels this way. She is having too many issues to address over the phone and needs f/u visit with me...bring all meds and her CBG record. Also,I have the letter from Dr.Nishan's nurse but not the record of his consult/evaluation...is the actual consult note done and could we get it? Just document you spoke to pt and requested recordas and sign off phone note.   Left message on answering machine for pt to call back....Marland KitchenMarland KitchenArmenia Shannon  Jan 04, 2009 11:40 AM spoke with pt  and requested records.......Marland KitchenArmenia Shannon  Jan 07, 2009 10:02 AM  Additional Follow-up by: Beverley Fiedler MD,  Jan 03, 2009 4:17 PM

## 2010-09-30 NOTE — Progress Notes (Signed)
Summary: Address on Thurs 8/25  Phone Note Refill Request   Refills Requested: Medication #1:  PERCOCET 5-325 MG TABS 1-2 tablets by mouth up to every  8 hours as needed for severe pain pt would like to know if she can pick it up today 04-21-10  Initial call taken by: [name],  April 21, 2010 11:15 AM  Follow-up for Phone Call        Cannot get filled until Thursday 8/25. Follow-up by: Tereso Newcomer PA-C,  April 21, 2010 2:05 PM  Additional Follow-up for Phone Call Additional follow up Details #1::        pt is aware and will pick up on thursday Additional Follow-up by: [name],  April 21, 2010 2:39 PM    Additional Follow-up for Phone Call Additional follow up Details #2::    in your basket for pick up Follow-up by: Brynda Rim,  April 24, 2010 3:58 PM  Prescriptions: PERCOCET 5-325 MG TABS (OXYCODONE-ACETAMINOPHEN) 1-2 tablets by mouth up to every  8 hours as needed for severe pain  #60 x 0   Entered and Authorized by:   Tereso Newcomer PA-C   Signed by:   Tereso Newcomer PA-C on 04/24/2010   Method used:   Print then Give to Patient   RxID:   [id number]

## 2010-09-30 NOTE — Assessment & Plan Note (Signed)
Summary: MEDICINE ADJUSMENT//GK   Vital Signs:  Patient profile:   65 year old female Temp:     98.1 degrees F Pulse rate:   99 / minute Pulse rhythm:   regular Resp:     20 per minute BP sitting:   162 / 92  (right arm) Cuff size:   large  Vitals Entered By: Vesta Mixer CMA (April 08, 2009 2:10 PM) CC: pt states she had not been taking metformin correctly and her sugar was high, she was then started on insulin and gets sweats real bad.  She does not really use the insulin any longer either.  She has started taking all her diabetic pills regularly. Is Patient Diabetic? Yes  Pain Assessment Patient in pain? yes     Location: left shoulder Intensity: 7 CBG Result 104  Does patient need assistance? Ambulation Normal   CC:  pt states she had not been taking metformin correctly and her sugar was high and she was then started on insulin and gets sweats real bad.  She does not really use the insulin any longer either.  She has started taking all her diabetic pills regularly.[name]  History of Present Illness: 65 year old female patient previously followed by Dr. Barbaraann Barthel presents for followup.  She had been placed on Levemir insulin earlier in the year.  However, the patient was taking her metformin incorrectly.  She thought that she was doing so well that she decreased her metformin to once daily.  After Dr. Barbaraann Barthel placed her on insulin, she began to take her metformin twice a day again.  After that, she started to have hypoglycemic episodes.  Her sugars sometimes ran the 60s.  She decided to take herself off of insulin.  She has felt well since that time.  Since last being seen, she had left shoulder rotator cuff surgery.  She is slowly recovering from that.  Her surgeon was Dr. Everlena Cooper.  I believe she starts physical therapy next week.  She has been somewhat noncompliant with her diet since she's been home after her surgery.  She does admit to eating more salt.  She has noted some increasing  lower extremity edema.  She denies any significant dyspnea.    Allergies (verified): 1)  ! Motrin 2)  ! Asa 3)  Hydrochlorothiazide  Physical Exam  General:  alert, well-developed, and well-nourished.   Head:  normocephalic and atraumatic.   Neck:  supple.   Lungs:  normal breath sounds.   Heart:  normal rate and regular rhythm.   Abdomen:  soft.   Msk:  left arm splinted to approx. 80 degrees Extremities:  trace left pedal edema and trace right pedal edema.   Neurologic:  alert & oriented X3 and cranial nerves II-XII intact.   Skin:  bilat. feet without sig. callus formation onychomycosis noted several toes bilat.  Psych:  normally interactive.     Impression & Recommendations:  Problem # 1:  DM, UNCOMPLICATED, TYPE II (ICD-250.00) A1C looks great today! Keep on current medications.  Do not think she needs insulin. Will have her A1C check again in about 2-3 months to verify optimal sugar control.   Her updated medication list for this problem includes:    Metformin Hcl 1000 Mg Tabs (Metformin hcl) .[name]... 1 by mouth bid    Actos 45 Mg Tabs (Pioglitazone hcl) .[name]... 1 tablet by mouth daily for diabetes **needs labs**    Januvia 100 Mg Tabs (Sitagliptin phosphate) .[name]... 1 by mouth qam  Orders:  Podiatry Referral (Podiatry)  Problem # 2:  HYPERTENSION (ICD-401.9) Uncontrolled. Increase lisinopril to 40 mg daily. Her updated medication list for this problem includes:    Lasix 20 Mg Tabs (Furosemide) .[name]... Take 1 tablet by mouth once a day  Problem # 3:  ONYCHOMYCOSIS, TOENAILS (ICD-110.1) Refer to podiatry clinic for trimming of nails. Hesitant to place her on lamisil with actos and crestor.  Problem # 4:  EDEMA (ICD-782.3) Recent echo. normal. Take extra lasix as needed for increased edema.  Her updated medication list for this problem includes:    Lasix 20 Mg Tabs (Furosemide) .[name]... Take 1 tablet by mouth once a day  Complete Medication List: 1)  Albuterol 90  Mcg/act Aers (Albuterol) .... 2 puffs q4-6 hrs prn 2)  Metformin Hcl 1000 Mg Tabs (Metformin hcl) .[name].. 1 by mouth bid 3)  Actos 45 Mg Tabs (Pioglitazone hcl) .[name].. 1 tablet by mouth daily for diabetes **needs labs** 4)  Januvia 100 Mg Tabs (Sitagliptin phosphate) .[name].. 1 by mouth qam 5)  Pepcid 20 Mg Tabs (Famotidine) .[name].. 1 by mouth bid 6)  Nasacort Aq 55 Mcg/act Aers (Triamcinolone acetonide(nasal)) .[name].. 1 spray qnostril qday 7)  Allegra 180 Mg Tabs (Fexofenadine hcl) .[name].. 1 by mouth daily 8)  Crestor 5 Mg Tabs (Rosuvastatin calcium) .[name].. 1 by mouth nightly forcholesterol **needs labs before more refills** 9)  Arthrotec 75 75-200 Mg-mcg Tabs (Diclofenac-misoprostol) .[name].. 1 by mouth every 12 hours as needed for pain 10)  Tramadol Hcl 50 Mg Tabs (Tramadol hcl) .[name].. 1-2 tabs by mouth every 6 hours as needed pain 11)  Lisinopril 40 Mg Tabs (lisinopril)  .[name].. 1 by mouth daily for blood pressure 12)  Lasix 20 Mg Tabs (Furosemide) .... Take 1 tablet by mouth once a day  Other Orders: Capillary Blood Glucose/CBG ([address])  Patient Instructions: 1)  Schedule [facility] at [facility]. office. 2)  Check bloodwork in 1 week (BMET ICD-9 401.1) 3)  Please schedule a follow-up appointment in 1 month for blood pressure.  4)  Try to limit use of Arthrotec if possible. Prescriptions: LISINOPRIL 40 MG  TABS (LISINOPRIL) 1 by mouth daily for blood pressure  #30 x 3   Entered and Authorized by:   Tereso Newcomer PA-C   Signed by:   Tereso Newcomer PA-C on 04/08/2009   Method used:   Print then Give to Patient   RxID:   [id number]   Laboratory Results   Blood Tests     HGBA1C: 6.7%   (Normal Range: Non-Diabetic - 3-6%   Control Diabetic - 6-8%) CBG Random:: 104mg /dL

## 2010-09-30 NOTE — Letter (Signed)
Summary: PREP.FOR COLONOSCOPY  PREP.FOR COLONOSCOPY   Imported By: Arta Bruce 04/15/2009 11:23:36  _____________________________________________________________________  External Attachment:    Type:   Image     Comment:   External Document

## 2010-09-30 NOTE — Letter (Signed)
Summary: MAILED REQUESTED RECORDS TO [facility]  MAILED REQUESTED RECORDS TO [facility]   Imported By: Arta Bruce 05/16/2010 15:58:44  _____________________________________________________________________  External Attachment:    Type:   Image     Comment:   External Document

## 2010-09-30 NOTE — Progress Notes (Signed)
  Phone Note Call from Patient Call back at [facility] Phone [phone]   Summary of Call: The pt needs more refills from arthrotec medication. Pt is wondering why she needs to call every other month for this medication.  [facility]).  Alben Spittle PA-c  Initial call taken by: Manon Hilding,  November 18, 2009 3:27 PM  Follow-up for Phone Call        forward to provider Follow-up by: [name],  November 18, 2009 4:03 PM    Prescriptions: ARTHROTEC 75 75-200 MG-MCG  TABS (DICLOFENAC-MISOPROSTOL) 1 by mouth every 12 hours as needed for pain  #60 x 3   Entered and Authorized by:   Tereso Newcomer PA-C   Signed by:   Tereso Newcomer PA-C on 11/18/2009   Method used:   Faxed to ...       [facility] (retail)       [address] [place], [place]  [address]       Ph: [phone]       Fax: [phone]   RxID:   [id number]

## 2010-09-30 NOTE — Assessment & Plan Note (Signed)
Summary: CPP////KT   Vital Signs:  Patient profile:   65 year old female Menstrual status:  postmenopausal Height:      65 inches Weight:      299.2 pounds Temp:     98.1 degrees F Pulse rate:   98 / minute Pulse rhythm:   regular Resp:     18 per minute BP sitting:   148 / 85  (left arm) Cuff size:   large  Vitals Entered By: Arthor Captain (December 23, 2009 10:16 AM) CC: cpp, Hypertension Management Is Patient Diabetic? Yes Pain Assessment Patient in pain? yes     Location: SINUSES AND RT.KNEE Intensity: 8 Type: aching Onset of pain  Chronic CBG Result 113 CBG Device ID B  Does patient need assistance? Functional Status Self care Ambulation Normal     Menstrual Status postmenopausal Last PAP Result NEGATIVE FOR INTRAEPITHELIAL LESIONS OR MALIGNANCY.   Primary Care Provider:  Tereso Newcomer PA-C  CC:  cpp and Hypertension Management.  History of Present Illness: Here for CPP.  Health maint: No h/o abnormal paps. No vaginal bleeding.  Postmenopausal for about 3 years.  Notes some mood swings, bloating and hot flashes.  No discharge, odor or burning. C/o yeast infection.   Has treated herself with over the counter medications. Never had abnormal mammo. Due for colonoscopy. Takes one a day vitamin.     Knee pain:  Right knee. . . stiff in the morning.  Feels like it is locking up at times.  Was seeing a personal trainer 1-2 years ago.  Had a sudden pain in knee 1 1/2 years ago.  "I felt like everything was unraveling in my knee."  Went to the ED.  Record of an xray in 2007 with mild DJD.  She states she fells a couple weeks ago.  Did not have cane at the time.  States her leg crosses over to the other side while she is walking and she falls or comes close to falling.    Hypertension History:      She denies chest pain and syncope.  She notes no problems with any antihypertensive medication side effects.        Positive major cardiovascular risk factors include  female age 87 years old or older, diabetes, hyperlipidemia, and hypertension.  Negative major cardiovascular risk factors include negative family history for ischemic heart disease and non-tobacco-user status.        Further assessment for target organ damage reveals no history of ASHD, stroke/TIA, or peripheral vascular disease.     Problems Prior to Update: 1)  Family History Diabetes 1st Degree Relative  (ICD-V18.0) 2)  Dental Caries  (ICD-521.00) 3)  Diabetic Retinopathy  (ICD-250.50) 4)  Edema  (ICD-782.3) 5)  Onychomycosis, Toenails  (ICD-110.1) 6)  Dyslipidemia  (ICD-272.4) 7)  Electrocardiogram, Abnormal  (ICD-794.31) 8)  Aodm  (ICD-250.00) 9)  Preoperative Examination  (ICD-V72.84) 10)  Hypertension  (ICD-401.9) 11)  Electrocardiogram, Abnormal  (ICD-794.31) 12)  Screening For Mlig Neop, Breast, Nos  (ICD-V76.10) 13)  Screening For Malignant Neoplasm, Cervix  (ICD-V76.2) 14)  Examination, Routine Medical  (ICD-V70.0) 15)  Sciatica, Right  (ICD-724.3) 16)  Dm, Uncomplicated, Type II  (ICD-250.00) 17)  Hypertension, Benign Essential  (ICD-401.1) 18)  Anemia Due To Dietary Iron Deficiency  (ICD-280.1) 19)  Osteoarthritis, Generalized  (ICD-715.00)  Allergies (verified): 1)  ! Motrin 2)  ! Asa 3)  Hydrochlorothiazide  Past History:  Past Medical History: Last updated: 12/24/2008 diabetes Hypertension Left TKR R  rotator cuff surger Abnormal ECG  Past Surgical History: Left knee replacement orthopedic Procedure  s/p rotator cuff surgery bilaterally  Family History: noncontributary No FHx of breast or colon cancer. 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  Non-smoker Non-drinker  Review of Systems      See HPI General:  Denies chills and fever. ENT:  Complains of sinus pressure. CV:  Denies chest pain or  discomfort and fainting. Resp:  Denies cough. GI:  Denies bloody stools and dark tarry stools. GU:  Denies dysuria. MS:  Complains of joint pain. Derm:  Denies rash. Endo:  Complains of heat intolerance.  Physical Exam  General:  alert, well-developed, and well-nourished.   Head:  normocephalic and atraumatic.   Eyes:  pupils equal, pupils round, and pupils reactive to light.   fundi difficult to visualize  Ears:  R ear normal and L ear normal.   Nose:  no external deformity.   Mouth:  pharynx pink and moist.   Neck:  supple, no thyromegaly, no JVD, no carotid bruits, and no cervical lymphadenopathy.   Lungs:  normal breath sounds, no crackles, and no wheezes.   Heart:  normal rate and regular rhythm.   Abdomen:  soft, non-tender, and normal bowel sounds.   Msk:  left knee TKR scar right knee:  + crepitus; neg McMurray, neg ant drawer, no eff  Extremities:  no edema  Neurologic:  alert & oriented X3 and cranial nerves II-XII intact.   Skin:  turgor normal.   Psych:  normally interactive and good eye contact.     Impression & Recommendations:  Problem # 1:  EXAMINATION, ROUTINE MEDICAL (ICD-V70.0)  pap today schedule screening colo stool cards schedule mammo never did PHQ9 today. . . get at next visit   Orders: Mammogram (Screening) (Mammo) Hemoccult Cards -3 specimans (take home) (03474) Hemoccult Guaiac-1 spec.(in office) (82270) T-Pap Smear, Thin Prep (25956) T-Urinalysis (38756-43329) T-CBC w/Diff (51884-16606) T-HIV Antibody  (Reflex) (30160-10932) T-TSH (35573-22025) Gastroenterology Referral (GI)  Problem # 2:  DIABETIC  RETINOPATHY (ICD-250.50) has seen eye doctor says everything ok got reading glasses  Her updated medication list for this problem includes:    Metformin Hcl 1000 Mg Tabs (Metformin hcl) ..... One tablet by mouth two times a day for blood sugar    Actos 45 Mg Tabs (Pioglitazone hcl) .[name]... 1 tablet by mouth daily for diabetes     Januvia 100 Mg Tabs (Sitagliptin phosphate) ..... One tablet by mouth daily for blood sugar  Problem # 3:  DYSLIPIDEMIA (ICD-272.4) needs fasting lipids  Her updated medication list for this problem includes:    Crestor 5 Mg Tabs (Rosuvastatin calcium) .[name]... 1 by mouth nightly forcholesterol **needs labs before more refills**  Problem # 4:  HYPERTENSION (ICD-401.9) change amlodipine ot 10 mg to get to goal (< 130/80)  Her updated medication list for this problem includes:    Lasix 20 Mg Tabs (Furosemide) .[name]... Take 1 tablet by mouth once a day    Amlodipine Besylate 10 Mg Tabs (Amlodipine besylate) .[name]... Take 1 tablet by mouth once a day for blood pressure (pharmacy note change in dose)  Orders: T-Comprehensive Metabolic Panel (42706-23762) T-TSH [phone])  Problem # 5:  SCREENING FOR MALIGNANT NEOPLASM, CERVIX (ICD-V76.2)  Orders: KOH/ WET [place] [phone]) T-Pap Smear, Thin Prep (62694)  Problem # 6:  DM, UNCOMPLICATED, TYPE II (ICD-250.00)  A1C now below 7  cont current meds  Her updated medication list for this problem includes:    Metformin Hcl 1000 Mg Tabs (Metformin hcl) ..... One tablet by mouth two times a day for blood sugar    Actos 45 Mg Tabs (Pioglitazone hcl) .[name]... 1 tablet by mouth daily for diabetes    Januvia 100 Mg Tabs (Sitagliptin phosphate) ..... One tablet by mouth daily for blood sugar  Orders: Capillary Blood Glucose/CBG (16109) T-Comprehensive Metabolic Panel (60454-09811)  Problem # 7:  OSTEOARTHRITIS, GENERALIZED (ICD-715.00) never got right knee xray send for xray consider steroid injection vs. referral to PT  The following medications were removed from the medication list:    Tramadol Hcl 50 Mg Tabs (Tramadol hcl) .[name]... 1-2 tabs by mouth every 6 hours as needed pain Her updated medication list for this problem includes:    Arthrotec 75 75-200 Mg-mcg Tabs (Diclofenac-misoprostol) .[name]... 1 by mouth every 12 hours as needed for pain    Percocet  5-325 Mg Tabs (Oxycodone-acetaminophen) .[name]... 1-2 tablets by mouth up to every  8 hours as needed for severe pain  Complete Medication List: 1)  Proventil Hfa 108 (90 Base) Mcg/act Aers (Albuterol sulfate) .[name].. 1-2 puffs every 4-6 hours as needed 2)  Metformin Hcl 1000 Mg Tabs (Metformin hcl) .... One tablet by mouth two times a day for blood sugar 3)  Actos 45 Mg Tabs (Pioglitazone hcl) .[name].. 1 tablet by mouth daily for diabetes 4)  Januvia 100 Mg Tabs (Sitagliptin phosphate) .... One tablet by mouth daily for blood sugar 5)  Pepcid 20 Mg Tabs (Famotidine) .[name].. 1 by mouth bid 6)  Nasacort Aq 55 Mcg/act Aers (Triamcinolone acetonide(nasal)) .[name].. 1 spray qnostril qday 7)  Allegra 180 Mg Tabs (Fexofenadine hcl) .[name].. 1 by mouth daily 8)  Crestor 5 Mg Tabs (Rosuvastatin calcium) .[name].. 1 by mouth nightly forcholesterol **needs labs before more refills** 9)  Arthrotec 75 75-200 Mg-mcg Tabs (Diclofenac-misoprostol) .[name].. 1 by mouth every 12 hours as needed for pain 10)  Lisinopril 40 Mg Tabs (lisinopril)  .[name].. 1 by mouth daily for blood pressure 11)  Lasix 20 Mg Tabs (Furosemide) .... Take 1 tablet by mouth once a day 12)  Amlodipine Besylate 10 Mg Tabs (Amlodipine besylate) .... Take 1 tablet by mouth once a day for blood pressure (pharmacy note change in dose) 13)  Percocet 5-325 Mg Tabs (Oxycodone-acetaminophen) .[name].. 1-2 tablets by mouth up to every  8 hours as needed for severe pain 14)  Caltrate 600+d Plus 600-400 Mg-unit Tabs (Calcium carbonate-vit d-min) .... Take 1 tablet by mouth two times a day  Other Orders: T- Hemoglobin A1C ([id number])  Hypertension Assessment/Plan:      The patient's hypertensive risk group is category C: Target organ damage and/or diabetes.  Her calculated 10 year risk of coronary heart disease is 24 %.  Today's blood pressure is 148/85.  Her blood pressure goal is < 130/80.  Patient Instructions: 1)  You should take calcium.  You need 600 mg of calcium with Vit. D  400 International Units two times a day. 2)  Return fasting in the next 2-3 weeks for Lipids.  Do not eat or drink anything after midnight except water. 3)  Get your knee xray. 4)  Please schedule a follow-up appointment in 3 months with Jiyaan Steinhauser for diabetes and high blood pressure.  Prescriptions: AMLODIPINE BESYLATE 10 MG TABS (AMLODIPINE BESYLATE) Take 1 tablet by mouth once a day for blood pressure (pharmacy note change in dose)  #  30 x 5   Entered and Authorized by:   Tereso Newcomer PA-C   Signed by:   Tereso Newcomer PA-C on 12/23/2009   Method used:   Faxed to ...       [facility] (retail)       [address] [place], [place]  [address]       Ph: [phone]       Fax: [phone]   RxID:   [id number] CALTRATE 600+D PLUS 600-400 MG-UNIT TABS (CALCIUM CARBONATE-VIT D-MIN) Take 1 tablet by mouth two times a day  #60 x 11   Entered and Authorized by:   Tereso Newcomer PA-C   Signed by:   Tereso Newcomer PA-C on 12/23/2009   Method used:   Faxed to ...       [facility] - [facility] (retail)       [address] [place], [place]  [address]       Ph: [phone]       Fax: [phone]   RxID:   [id number]   Laboratory Results   Urine Tests  Date/Time Received: December 23, 2009 10:37 AM  Date/Time Reported: December 23, 2009 10:37 AM   Routine Urinalysis   Color: lt. yellow Appearance: Clear Glucose: 100   (Normal Range: Negative) Bilirubin: negative   (Normal Range: Negative) Ketone: negative   (Normal Range: Negative) Spec. Gravity: >=1.030   (Normal Range: 1.003-1.035) Blood: negative   (Normal Range: Negative) pH: 5.0   (Normal Range: 5.0-8.0) Protein: trace   (Normal Range: Negative) Urobilinogen: 0.2   (Normal Range: 0-1) Nitrite: negative   (Normal Range: Negative) Leukocyte Esterace: negative   (Normal Range: Negative)     Blood Tests   Date/Time Received: December 23, 2009 10:27  AM  Date/Time Reported: December 23, 2009 10:27 AM   HGBA1C: 6.8%   (Normal Range: Non-Diabetic - 3-6%   Control Diabetic - 6-8%) CBG Random:: 113mg /dL    [employer] [place] Source: vaginal WBC/hpf: 1-5 Bacteria/hpf: rare Clue cells/hpf: none  Negative whiff Yeast/hpf: none Wet Mount KOH: Negative Trichomonas/hpf: none  Other Tests  Rapid HIV: negative  Stool - Occult Blood Hemmoccult #1: negative Date: 12/23/2009      Appended Document: CPP////KT    Clinical Lists Changes  Observations: Added new observation of GU EXAM GEN: normal introitus, no external lesions, no vaginal discharge, mucosa pink and moist, no vaginal or cervical lesions, no vaginal atrophy, and no friaility or hemorrhage.   body habitus limited ability to completely eval fundus and adnexae (12/25/2009 12:26) Added new observation of PEADULT: Tereso Newcomer PA-C ~Breasts`Breast Insp ~Genitalia`GU exam gen (12/25/2009 12:26) Added new observation of BREAST INSP: skin/areolae normal, no masses, no abnormal thickening, no nipple discharge, no tenderness, and no adenopathy.   (12/25/2009 12:26)        Physical Exam  Breasts:  skin/areolae normal, no masses, no abnormal thickening, no nipple discharge, no tenderness, and no adenopathy.   Genitalia:  normal introitus, no external lesions, no vaginal discharge, mucosa pink and moist, no vaginal or cervical lesions, no vaginal atrophy, and no friaility or hemorrhage.   body habitus limited ability to completely eval fundus and adnexae

## 2010-09-30 NOTE — Assessment & Plan Note (Signed)
Summary: BP FU//////KT   Vital Signs:  Patient profile:   65 year old female Height:      65 inches Weight:      280 pounds BMI:     46.76 Temp:     97.8 degrees F oral Pulse rate:   72 / minute Pulse rhythm:   regular Resp:     18 per minute BP sitting:   142 / 86  (left arm) Cuff size:   large  Vitals Entered By: [name] (Jan 21, 2009 3:06 PM) Is Patient Diabetic? Yes  Pain Assessment Patient in pain? no      CBG Result 81  Does patient need assistance? Ambulation Normal   History of Present Illness: Here for final surgical clearance. Has BS record...fasting 92-122 and rest of day 98-124. Needs Surgical clearance,Dr.Jaffe.  Her last HgA1c was 7.%. BP now much improved. She has seen Dr.Nishan,cardiologist, and been cleared for surgery.Notes reviewed.  She had a cold/laryngitis last week but is much improved now.  Her EMR med list had duplicates listed. All meds reviewed with patient and duplicates removed.  Allergies (verified): 1)  ! Motrin 2)  ! Asa 3)  Hydrochlorothiazide  Past History:  Past Medical History:    Reviewed history from 12/24/2008 and no changes required:    diabetes    Hypertension    Left TKR    R rotator cuff surger    Abnormal ECG  Past Surgical History:    Reviewed history from 12/20/2008 and no changes required:     knee replacement    orthopedic Procedure   Social History:    Reviewed history from 12/24/2008 and no changes required:       Single       One older daughter       Works as a Licensed conveyancer.       [employer]       Non-smoker       Non-drinker  Physical Exam  General:  Well-developed,well-nourished,in no acute distress; alert,appropriate and cooperative throughout examination Lungs:  Normal respiratory effort, chest expands symmetrically. Lungs are clear to auscultation, no crackles or wheezes. Heart:  Normal rate and regular rhythm. S1 and S2 normal without gallop, murmur, click, rub or other  extra sounds.   Impression & Recommendations:  Problem # 1:  DM, UNCOMPLICATED, TYPE II (ICD-250.00) HgA1c=7.4%. Her updated medication list for this problem includes:    Metformin Hcl 1000 Mg Tabs (Metformin hcl) .[name]... 1 by mouth bid    Actos 45 Mg Tabs (Pioglitazone hcl) .[name]... 1 tablet by mouth daily for diabetes **needs labs**    Januvia 100 Mg Tabs (Sitagliptin phosphate) .[name]... 1 by mouth qam    Lisinopril 20 Mg Tabs (Lisinopril) .[name]... 1 by mouth daily for blood pressure  Problem # 2:  HYPERTENSION (ICD-401.9) Much improved from 4/19 value of 192/100.  Her updated medication list for this problem includes:    Lisinopril 20 Mg Tabs (Lisinopril) .[name]... 1 by mouth daily for blood pressure    Lasix 20 Mg Tabs (Furosemide) .[name]... Take 1 tablet by mouth once a day  Problem # 3:  ANEMIA DUE TO DIETARY IRON DEFICIENCY (ICD-280.1) Resolved per 10/2008 CBC.  Complete Medication List: 1)  Albuterol 90 Mcg/act Aers (Albuterol) .... 2 puffs q4-6 hrs prn 2)  Metformin Hcl 1000 Mg Tabs (Metformin hcl) .[name].. 1 by mouth bid 3)  Actos 45 Mg Tabs (Pioglitazone hcl) .[name].. 1 tablet by mouth daily for diabetes **needs labs**  4)  Januvia 100 Mg Tabs (Sitagliptin phosphate) .[name].. 1 by mouth qam 5)  Pepcid 20 Mg Tabs (Famotidine) .[name].. 1 by mouth bid 6)  Nasacort Aq 55 Mcg/act Aers (Triamcinolone acetonide(nasal)) .[name].. 1 spray qnostril qday 7)  Allegra 180 Mg Tabs (Fexofenadine hcl) .[name].. 1 by mouth daily 8)  Crestor 5 Mg Tabs (Rosuvastatin calcium) .[name].. 1 by mouth nightly forcholesterol **needs labs before more refills** 9)  Arthrotec 75 75-200 Mg-mcg Tabs (Diclofenac-misoprostol) .[name].. 1 by mouth every 12 hours as needed for pain 10)  Tramadol Hcl 50 Mg Tabs (Tramadol hcl) .[name].. 1-2 tabs by mouth every 6 hours as needed pain 11)  Lisinopril 20 Mg Tabs (Lisinopril) .[name].. 1 by mouth daily for blood pressure 12)  Lasix 20 Mg Tabs (Furosemide) .... Take 1 tablet by mouth once a day  Patient Instructions: 1)   Dr.Jaffe,[facility],#[phone]. 2)  You are medically cleared for shoulder replacement surgery.    Laboratory Results   Blood Tests   Date/Time Received: Jan 21, 2009 3:08 PM   HGBA1C: 7.4%   (Normal Range: Non-Diabetic - 3-6%   Control Diabetic - 6-8%) CBG Random:: 81mg /dL

## 2010-09-30 NOTE — Progress Notes (Signed)
Summary: Office Visit - podiatry  Office Visit - podiatry   Imported By: Paula Libra 04/12/2009 13:18:59  _____________________________________________________________________  External Attachment:    Type:   Image     Comment:   External Document

## 2010-09-30 NOTE — Letter (Signed)
Summary: PREP FOR COLONOSCOPY  PREP FOR COLONOSCOPY   Imported By: Arta Bruce 05/26/2010 16:15:04  _____________________________________________________________________  External Attachment:    Type:   Image     Comment:   External Document

## 2010-09-30 NOTE — Miscellaneous (Signed)
Summary: VIP  Patient: Katelyn [name] Note: All result statuses are Final unless otherwise noted.  Tests: (1) VIP (Medications)   LLIMPORTMEDS              "Result Below..."       RESULT: VENTOLIN HFA AERS 108 (90 Base) MCG/ACT*2 PUFFS EVERY 4 TO 6 HOURS AS NEEDED*02/09/2007*Last Refill: [id number]*******   LLIMPORTMEDS              "Result Below..."       RESULT: TRIAMCINOLONE ACETONIDE OINT 0.1 %*APPLY TO AFFECTED AREA(S) 2 TIMES DAILY UNTIL CLEAR.*02/09/2007*Last Refill: 04/07/2007*[id number]*******   LLIMPORTMEDS              "Result Below..."       RESULT: TRAMADOL HCL TABS 50 MG*TAKE 1 TO 2 TABLETS EVERY 6 HOURS AS NEEDED FOR PAIN*03/10/2007*Last Refill: 06/06/2007*[id number]*******   LLIMPORTMEDS              "Result Below..."       RESULT: NASACORT AQ AERS 55 MCG/ACT*USE ONE SPRAY IN EACH NOSTRIL TWICE A DAY*04/07/2007*Last Refill: 06/06/2007*[id number]*******   LLIMPORTMEDS              "Result Below..."       RESULT: MICONAZOLE NITRATE CREA 2 %*APPLY TO SKIN RASH AS DIRECTED*04/07/2007*Last Refill: [id number]*******   LLIMPORTMEDS              "Result Below..."       RESULT: LISINOPRIL TABS 20 MG*TAKE ONE (1) TABLET EVERY MORNING*03/10/2007*Last Refill: 06/06/2007*[id number]*******   LLIMPORTMEDS              "Result Below..."       RESULT: JANUVIA TABS 100 MG*TAKE ONE (1) TABLET EVERY MORNING*02/09/2007*Last Refill: 05/05/2007*[id number]*******   LLIMPORTMEDS              "Result Below..."       RESULT: GLUCOPHAGE TABS 1000 MG*TAKE ONE (1) TABLET TWICE DAILY*02/09/2007*Last Refill: 06/06/2007*[id number]*******   LLIMPORTMEDS              "Result Below..."       RESULT: FUROSEMIDE TABS 20 MG*TAKE ONE (1) TABLET EACH DAY   Generic for LASIX 20MG  TAB*04/28/2007*Last Refill: 06/06/2007*[id number]*******   LLIMPORTMEDS              "Result Below..."       RESULT: FAMOTIDINE TABS 20 MG*TAKE ONE (1) TABLET TWICE DAILY*02/09/2007*Last Refill: 06/06/2007*[id number]*******   LLIMPORTMEDS              "Result Below..."       RESULT: DIFLUCAN TABS 100 MG*TAKE ONE AND A HALF TABLETS (150MG ) EACH DAY  FOR 5 DAYS*04/07/2007*Last Refill: [id number]*******   LLIMPORTMEDS              "Result Below..."       RESULT: CRESTOR TABS 5 MG*TAKE ONE (1) TABLET BY MOUTH EVERY DAY*03/10/2007*Last Refill: 06/06/2007*[id number]*******   LLIMPORTMEDS              "Result Below..."       RESULT: ARTHROTEC 75 TABS 75-200 MG-MCG*TAKE ONE TABLET BY MOUTH EVERY 12 HOURS AS NEEDED FOR PAIN*02/28/2007*Last Refill: 06/06/2007*[id number]*******   LLIMPORTMEDS              "Result Below..."       RESULT: ALLEGRA TABS 180 MG*TAKE ONE (1) TABLET BY MOUTH EVERY DAY*03/10/2007*Last Refill: 06/06/2007*[id number]*******   LLIMPORTMEDS              "Result Below..."       RESULT: ACTOS TABS 45  MG*TAKE ONE (1) TABLET EVERY MORNING*03/10/2007*Last Refill: 06/06/2007*[id number]*******   LLIMPORTALLS              "Result Below..."       RESULT: ASPIRIN UXNA*[id number]**  Note: An exclamation mark (!) indicates a result that was not dispersed into the flowsheet. Document Creation Date: 06/30/2007 3:02 PM _______________________________________________________________________  (1) Order result status: Final Collection or observation date-time: 05/18/2007 Requested date-time: 05/18/2007 Receipt date-time:  Reported date-time: 05/18/2007 Referring Physician:   Ordering Physician:   Specimen Source:  Source: [place] Order Number:  Lab site:

## 2010-09-30 NOTE — Letter (Signed)
Summary: PROJECT ACCESS  PROJECT ACCESS   Imported By: Arta Bruce 09/21/2007 15:59:04  _____________________________________________________________________  External Attachment:    Type:   Image     Comment:   External Document

## 2010-09-30 NOTE — Assessment & Plan Note (Signed)
Summary: trigger finger injection///bc   Vital Signs:  Patient profile:   65 year old female Menstrual status:  postmenopausal Height:      65 inches Weight:      301.7 pounds BP sitting:   130 / 78  (left arm) Cuff size:   large  Vitals Entered By: Michelle Nasuti (May 16, 2010 3:37 PM)  Primary Care Provider:  Tereso Newcomer PA-C   History of Present Illness: Pt. here for injection of right 5th trigger finger.  Allergies: 1)  ! Motrin 2)  ! Asa 3)  Hydrochlorothiazide  Physical Exam  Extremities:  Area of swelling/pulley formation found.  1/2 cc of 1% lidocaine and 1 ml of 40 mg/ml of Depomedrol injected to area after thoroughly with betadine.  Good hemostasis.  Pt. tolerated procedure well without complication.  Splinted straight.   Impression & Recommendations:  Problem # 1:  TRIGGER FINGER (ICD-727.03)  Orders: Joint Aspirate / Injection, Small ([address])  Complete Medication List: 1)  Proventil Hfa 108 (90 Base) Mcg/act Aers (Albuterol sulfate) .[name].. 1-2 puffs every 4-6 hours as needed 2)  Metformin Hcl 1000 Mg Tabs (Metformin hcl) .... One tablet by mouth two times a day for blood sugar 3)  Actos 45 Mg Tabs (Pioglitazone hcl) .[name].. 1 tablet by mouth daily for diabetes 4)  Januvia 100 Mg Tabs (Sitagliptin phosphate) .... One tablet by mouth daily for blood sugar 5)  Pepcid 20 Mg Tabs (Famotidine) .[name].. 1 by mouth bid 6)  Nasacort Aq 55 Mcg/act Aers (Triamcinolone acetonide(nasal)) .[name].. 1 spray qnostril qday 7)  Allegra 180 Mg Tabs (Fexofenadine hcl) .[name].. 1 by mouth daily 8)  Crestor 5 Mg Tabs (Rosuvastatin calcium) .[name].. 1 by mouth nightly forcholesterol **needs labs before more refills** 9)  Arthrotec 75 75-200 Mg-mcg Tabs (Diclofenac-misoprostol) .[name].. 1 by mouth every 12 hours as needed for pain 10)  Lisinopril 40 Mg Tabs (lisinopril)  .[name].. 1 by mouth daily for blood pressure 11)  Lasix 20 Mg Tabs (Furosemide) .... Take 1 tablet by mouth once a day 12)  Amlodipine  Besylate 10 Mg Tabs (Amlodipine besylate) .... Take 1 tablet by mouth once a day for blood pressure (pharmacy note change in dose) 13)  Percocet 5-325 Mg Tabs (Oxycodone-acetaminophen) .[name].. 1-2 tablets by mouth up to every  8 hours as needed for severe pain 14)  Caltrate 600+d Plus 600-400 Mg-unit Tabs (Calcium carbonate-vit d-min) .... Take 1 tablet by mouth two times a day 15)  Fish Oil 1000 Mg Caps (Omega-3 fatty acids) .... Take 1 capsule by mouth once a day 16)  Aspirin 81 Mg Tabs (Aspirin) .... Take 1 tablet by mouth once a day 17)  Lantus Solostar 100 Unit/ml Soln (Insulin glargine) .... Inject 10 units at bedtime  Patient Instructions: 1)  Call and be seen immediately for redness, swelling, pain of area. 2)  Follow up with Dr. Delrae Alfred in 2 weeks --recheck of trigger finger. 3)  Keep finger splinted at all times, unless washing hands or showering--try to keep finger straight during those times as well.

## 2010-09-30 NOTE — Progress Notes (Signed)
Summary: Wants updates on dental and colonoscopy referrals  Phone Note Call from Patient   Reason for Call: Referral Summary of Call: Wants to know status of dental referral and colonoscopy -- states that her lower molar is bothering her.  Initial call taken by: Dutch Quint RN,  February 17, 2010 11:54 AM  Follow-up for Phone Call        dental will contact pt I will contact Pt in July or August for Colonoscopy Follow-up by: Candi Leash,  February 17, 2010 12:40 PM  Additional Follow-up for Phone Call Additional follow up Details #1::        Spoke with pt. and advised of referrals in progress -- verbalized understanding.  Dutch Quint RN  February 17, 2010 2:41 PM  Additional Follow-up by: Dutch Quint RN,  February 17, 2010 2:41 PM

## 2010-09-30 NOTE — Progress Notes (Signed)
  Phone Note Other Incoming   Summary of Call: pt has a referral for colonoscopy Initial call taken by: [name],  Jan 24, 2010 10:16 AM

## 2010-09-30 NOTE — Progress Notes (Signed)
Summary: phone note  Phone Note Call from Patient   Caller: Patient Reason for Call: Refill Medication Summary of Call: THE PT IS TRYING TO GET MORE REFILLS FROM HER FLUID MEDICATIONS; SHE CALLED AT THE [facility] BUT THEY TOLD HER THAT IN RECORD THAT MEDICATION IS NOT LISTED.  PLEASE CALL HER BACK Initial call taken by: Manon Hilding,  April 25, 2007 12:44 PM  Follow-up for Phone Call        pt had been taking lasix from an old script, unable to tolerate HCTZ, requesting refill of lasix  Additional Follow-up for Phone Call Additional follow up Details #1::        Phone Call Completed, Rx Called In Pt prev on lasix 12/07.Pt requested trial of HCTZ. OK to switch back to lasix 20 mg 1 by mouth qday.#30. 4RF.MD called to [facility] and left message for pt at home #. 12pm VR

## 2010-09-30 NOTE — Progress Notes (Signed)
Summary: pain medication   Phone Note Call from Patient Call back at Home Phone [phone]   Caller: Patient Summary of Call: the patient will call the [facility] for more refills in [facility]ocaste med.  She said that the physician told her that when she needs more medication she just need to call in for get more refills. Dr. Barbaraann Barthel Initial call taken by: Manon Hilding,  July 26, 2007 3:59 PM  Follow-up for Phone Call        Pain medication is percocet. Will print script for patient to pick-up The #60 percocet must last 30 days. Follow-up by: Beverley Fiedler MD,  August 01, 2007 5:53 PM      Prescriptions: PERCOCET 5-325 MG  TABS (OXYCODONE-ACETAMINOPHEN) Take 1-2 tablets by mouth every 6-8 hours as needed for back pain  #60 x 0   Entered and Authorized by:   Beverley Fiedler MD   Signed by:   Beverley Fiedler MD on 08/01/2007   Method used:   Print then Give to Patient   RxID:   [id number]

## 2010-09-30 NOTE — Assessment & Plan Note (Signed)
Summary: FELL ON HAND IN [facility]//KT   Vital Signs:  Patient profile:   65 year old female Menstrual status:  postmenopausal Height:      65 inches Weight:      299 pounds BMI:     49.94 Temp:     98.4 degrees F oral Pulse rate:   88 / minute Pulse rhythm:   regular Resp:     18 per minute BP sitting:   138 / 80  (left arm) Cuff size:   large  Vitals Entered By: [name] (April 29, 2010 12:04 PM) CC: pt is here cause she fell..... Is Patient Diabetic? No Pain Assessment Patient in pain? no       Does patient need assistance? Functional Status Self care Ambulation Normal   Primary Care Provider:  Tereso Newcomer PA-C  CC:  pt is here cause she fell......  History of Present Illness: FOSH on 03/04/2010.  Went to [facility] ED.  No acute fx in wrist or hand.  Does have DJD as noted below.  She has had cont'd pain in 5th finger.  Has called ED several times and told it was probably arthritis.  Has pain in 5th finger.  Palmer flexion difficult and finger gets stuck closed.  Hurt to open it.  She has not tried anything for it.   Current Medications (verified): 1)  Proventil Hfa 108 (90 Base) Mcg/act Aers (Albuterol Sulfate) .[name].. 1-2 Puffs Every 4-6 Hours As Needed 2)  Metformin Hcl 1000 Mg  Tabs (Metformin Hcl) .... One Tablet By Mouth Two Times A Day For Blood Sugar 3)  Actos 45 Mg  Tabs (Pioglitazone Hcl) .[name].. 1 Tablet By Mouth Daily For Diabetes 4)  Januvia 100 Mg  Tabs (Sitagliptin Phosphate) .... One Tablet By Mouth Daily For Blood Sugar 5)  Pepcid 20 Mg  Tabs (Famotidine) .[name].. 1 By Mouth Bid 6)  Nasacort Aq 55 Mcg/act  Aers (Triamcinolone Acetonide(Nasal)) .[name].. 1 Spray Qnostril Qday 7)  Allegra 180 Mg  Tabs (Fexofenadine Hcl) .[name].. 1 By Mouth Daily 8)  Crestor 5 Mg  Tabs (Rosuvastatin Calcium) .[name].. 1 By Mouth Nightly Forcholesterol **needs Labs Before More Refills** 9)  Arthrotec 75 75-200 Mg-Mcg  Tabs (Diclofenac-Misoprostol) .[name].. 1 By Mouth Every 12 Hours As Needed  For Pain 10)  Lisinopril 40 Mg  Tabs (Lisinopril) .[name].. 1 By Mouth Daily For Blood Pressure 11)  Lasix 20 Mg  Tabs (Furosemide) .... Take 1 Tablet By Mouth Once A Day 12)  Amlodipine Besylate 10 Mg Tabs (Amlodipine Besylate) .... Take 1 Tablet By Mouth Once A Day For Blood Pressure (Pharmacy Note Change in Dose) 13)  Percocet 5-325 Mg Tabs (Oxycodone-Acetaminophen) .[name].. 1-2 Tablets By Mouth Up To Every  8 Hours As Needed For Severe Pain 14)  Caltrate 600+d Plus 600-400 Mg-Unit Tabs (Calcium Carbonate-Vit D-Min) .... Take 1 Tablet By Mouth Two Times A Day 15)  Fish Oil 1000 Mg Caps (Omega-3 Fatty Acids) .... Take 1 Capsule By Mouth Once A Day 16)  Aspirin 81 Mg Tabs (Aspirin) .... Take 1 Tablet By Mouth Once A Day 17)  Lantus Solostar 100 Unit/ml Soln (Insulin Glargine) .... Inject 10 Units At Bedtime  Allergies (verified): 1)  ! Motrin 2)  ! Asa 3)  Hydrochlorothiazide  Physical Exam  General:  alert, well-developed, and well-nourished.   Head:  normocephalic and atraumatic.   Msk:  right hand: no snuff box tenderness with palp small amount of swelling over PIPJ of 5th finger + trigger  finger ? pulley swelling over MCPJ Neurologic:  alert & oriented X3 and cranial nerves II-XII intact.   Psych:  normally interactive.     Impression & Recommendations:  Problem # 1:  TRIGGER FINGER (ICD-727.03) symptoms started after fall prob injured tendon or aggravated cond'n with fall already taking NSAIDs on a regular basis continue arthrotec arrange injection with Dr. Delrae Alfred  Complete Medication List: 1)  Proventil Hfa 108 (90 Base) Mcg/act Aers (Albuterol sulfate) .[name].. 1-2 puffs every 4-6 hours as needed 2)  Metformin Hcl 1000 Mg Tabs (Metformin hcl) .... One tablet by mouth two times a day for blood sugar 3)  Actos 45 Mg Tabs (Pioglitazone hcl) .[name].. 1 tablet by mouth daily for diabetes 4)  Januvia 100 Mg Tabs (Sitagliptin phosphate) .... One tablet by mouth daily for blood sugar 5)   Pepcid 20 Mg Tabs (Famotidine) .[name].. 1 by mouth bid 6)  Nasacort Aq 55 Mcg/act Aers (Triamcinolone acetonide(nasal)) .[name].. 1 spray qnostril qday 7)  Allegra 180 Mg Tabs (Fexofenadine hcl) .[name].. 1 by mouth daily 8)  Crestor 5 Mg Tabs (Rosuvastatin calcium) .[name].. 1 by mouth nightly forcholesterol **needs labs before more refills** 9)  Arthrotec 75 75-200 Mg-mcg Tabs (Diclofenac-misoprostol) .[name].. 1 by mouth every 12 hours as needed for pain 10)  Lisinopril 40 Mg Tabs (lisinopril)  .[name].. 1 by mouth daily for blood pressure 11)  Lasix 20 Mg Tabs (Furosemide) .... Take 1 tablet by mouth once a day 12)  Amlodipine Besylate 10 Mg Tabs (Amlodipine besylate) .... Take 1 tablet by mouth once a day for blood pressure (pharmacy note change in dose) 13)  Percocet 5-325 Mg Tabs (Oxycodone-acetaminophen) .[name].. 1-2 tablets by mouth up to every  8 hours as needed for severe pain 14)  Caltrate 600+d Plus 600-400 Mg-unit Tabs (Calcium carbonate-vit d-min) .... Take 1 tablet by mouth two times a day 15)  Fish Oil 1000 Mg Caps (Omega-3 fatty acids) .... Take 1 capsule by mouth once a day 16)  Aspirin 81 Mg Tabs (Aspirin) .... Take 1 tablet by mouth once a day 17)  Lantus Solostar 100 Unit/ml Soln (Insulin glargine) .... Inject 10 units at bedtime  Patient Instructions: 1)  Schedule trigger finger injection with Dr. Delrae Alfred.       X-ray  Procedure date:  03/04/2010  Findings:      Exam Type: right hand xray Results:  Comparison: Right hand radiographs 08/05/2008    Findings: There are significant degenerative changes at the carpal-   metacarpal joint of the thumb.  No acute bony findings.    IMPRESSION:    1.  No acute bony findings.   2.  Advanced degenerative changes at the carpal-metacarpal joint of   thumb.
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
## 2010-09-30 NOTE — Progress Notes (Signed)
Summary: Ortho  Phone Note From Other Clinic   Summary of Call: DEBRA... PT STATED THAT YOU MADE HER APPT TO SEE ORTHOPEDIC.[name] PT WOULD LIKE TO KNOW HAVE YOU TRIED THE [place] [facility].[name] PT SAYS THIS PLACE IS ALOT CHEAPER AND THAT SHE NORMAL DOESNT HAVE TO PAY WHEN SHE GOES THERE... PT WOULD LIKE TO KNOW IF YOU CAN REFER HER TO THEM AND SHE WOULD CANCEL THE OTHER ORTHO APPT IF THEY ACCEPT HER AGAIN. SHE GAVE ME A NUMBER... [phone] Initial call taken by: [name],  February 12, 2010 4:38 PM  Follow-up for Phone Call        Pt was aware of the cost before I sched her @ Ortho.I will put her back into rotation.I'll get back to her when I can Follow-up by: Candi Leash,  February 13, 2010 9:27 AM  Additional Follow-up for Phone Call Additional follow up Details #1::        [place] Please update patient on progress of referral as above per [place]. Tereso Newcomer PA-C  February 13, 2010 5:01 PM     Additional Follow-up for Phone Call Additional follow up Details #2::    pt is aware Follow-up by: [name],  February 14, 2010 4:05 PM

## 2010-09-30 NOTE — Letter (Signed)
Summary: NUTRITIONIST SUMMARY  NUTRITIONIST SUMMARY   Imported By: Arta Bruce 02/27/2009 12:06:53  _____________________________________________________________________  External Attachment:    Type:   Image     Comment:   External Document

## 2010-09-30 NOTE — Letter (Signed)
Summary: DENTAL REFERRAL  DENTAL REFERRAL   Imported By: Arta Bruce 02/17/2010 16:30:12  _____________________________________________________________________  External Attachment:    Type:   Image     Comment:   External Document

## 2010-09-30 NOTE — Letter (Signed)
Summary: [facility]  [facility]   Imported By: Arta Bruce 04/30/2009 10:02:29  _____________________________________________________________________  External Attachment:    Type:   Image     Comment:   External Document

## 2010-09-30 NOTE — Letter (Signed)
Summary: *HSN Results Follow up  [facility]  [address] [place], [place] [address]   Phone: [phone]  Fax: [phone]      12/25/2009   Katelyn [name] [address] [place], [place]  [address]   Dear  Ms. Katelyn [name],                            ____S.Drinkard,FNP   ____D. Gore,FNP       ____B. McPherson,MD   ____V. Rankins,MD    ____E. Mulberry,MD    ____N. Daphine Deutscher, FNP  ____D. Reche Dixon, MD    ____K. Philipp Deputy, MD    __x__S. Alben Spittle, PA-C     This letter is to inform you that your recent test(s):  ___x____Pap Smear    _______Lab Test     _______X-ray    ___x____ is within acceptable limits  _______ requires a medication change  _______ requires a follow-up lab visit  _______ requires a follow-up visit with your provider   Comments:       _________________________________________________________ If you have any questions, please contact our office                     Sincerely,  Katelyn Newcomer PA-C [facility]

## 2010-09-30 NOTE — Progress Notes (Signed)
  Phone Note Call from Patient   Summary of Call: pt needs a refill on Tramadol [facility] the [facility] [address]...Marland KitchenMarland Kitchen please see phone note on 10/16/09 and it explains why she is using a different pharmacy and why she needs an early prescription. Initial call taken by: [name],  October 16, 2009 4:02 PM  Follow-up for Phone Call        Make sure she feels better once she stops the medication. Follow-up by: Tereso Newcomer PA-C,  October 16, 2009 4:55 PM  Additional Follow-up for Phone Call Additional follow up Details #1::        spoke with pt and she is aware of med Additional Follow-up by: [name],  October 16, 2009 5:08 PM    Prescriptions: TRAMADOL HCL 50 MG  TABS (TRAMADOL HCL) 1-2 tabs by mouth every 6 hours as needed pain  #90 x 1   Entered and Authorized by:   Tereso Newcomer PA-C   Signed by:   Tereso Newcomer PA-C on 10/16/2009   Method used:   Electronically to        [facility] [address] (retail)       [phone]       [place], [place]  [address]       Ph: [phone]       Fax: [phone]   RxID:   [id number]

## 2010-09-30 NOTE — Progress Notes (Signed)
Summary: Wants refill pain med- needs pain contract signed  Phone Note Call from Patient   Summary of Call: Wants to get her prescription for her pain medication so that she can pick it up today.   Initial call taken by: Dutch Quint RN,  May 23, 2010 2:23 PM  Follow-up for Phone Call        on your desk do not see that she has signed a pain contract inform her that this is our policy for anyone on chronic pain meds  Follow-up by: Brynda Rim,  May 23, 2010 2:27 PM  Additional Follow-up for Phone Call Additional follow up Details #1::        She picked up Rx when I was away from desk -- did not sign pain contract.  Dutch Quint RN  May 23, 2010 5:15 PM     Additional Follow-up for Phone Call Additional follow up Details #2::    Ask her to come in one day before her next refill to sign pain contract Follow-up by: Tereso Newcomer PA-C,  May 27, 2010 11:35 AM  Additional Follow-up for Phone Call Additional follow up Details #3:: Details for Additional Follow-up Action Taken: Advised pt. of pain contract -- agrees to come by and sign.  Dutch Quint RN  May 27, 2010 12:42 PM   New/Updated Medications: PERCOCET 5-325 MG TABS (OXYCODONE-ACETAMINOPHEN) 1-2 tablets by mouth up to every  8 hours as needed for severe pain PERCOCET 5-325 MG TABS (OXYCODONE-ACETAMINOPHEN) 1-2 tablets by mouth up to every  8 hours as needed for severe pain Prescriptions: PERCOCET 5-325 MG TABS (OXYCODONE-ACETAMINOPHEN) 1-2 tablets by mouth up to every  8 hours as needed for severe pain  #60 x 0   Entered and Authorized by:   Tereso Newcomer PA-C   Signed by:   Tereso Newcomer PA-C on 05/23/2010   Method used:   Print then Give to Patient   RxID:   [id number] PERCOCET 5-325 MG TABS (OXYCODONE-ACETAMINOPHEN) 1-2 tablets by mouth up to every  8 hours as needed for severe pain  #60 x 0   Entered and Authorized by:   Tereso Newcomer PA-C   Signed by:   Tereso Newcomer PA-C on  05/23/2010   Method used:   Print then Give to Patient   RxID:   [id number]  Please note, only one Rx given. . . did not print on tamper proof paper at first. Tereso Newcomer PA-C  May 23, 2010 2:30 PM

## 2010-09-30 NOTE — Letter (Signed)
Summary: *HSN Results Follow up  [facility]  [address] [place], [place] [address]   Phone: [phone]  Fax: [phone]      01/16/2010   Katelyn [name] [address] [place], [place]  [address]   Dear  Ms. [name],                            ____S.Drinkard,FNP   ____D. Gore,FNP       ____B. McPherson,MD   ____V. Rankins,MD    ____E. Mulberry,MD    ____N. Daphine Deutscher, FNP  ____D. Reche Dixon, MD    ____K. Philipp Deputy, MD    ____Other     This letter is to inform you that your recent test(s):  _______Pap Smear    _______Lab Test     _______X-ray    _______ is within acceptable limits  _______ requires a medication change  _______ requires a follow-up lab visit  _______ requires a follow-up visit with your provider   Comments:  We have been trying to reach you.  Please give the office a call at your earliest convenience.       _________________________________________________________ If you have any questions, please contact our office                     Sincerely,  [name] [facility]

## 2010-09-30 NOTE — Letter (Signed)
Summary: HANDICAPPEED PLACARD  HANDICAPPEED PLACARD   Imported By: Arta Bruce 11/06/2008 09:45:36  _____________________________________________________________________  External Attachment:    Type:   Image     Comment:   External Document

## 2010-09-30 NOTE — Assessment & Plan Note (Signed)
Summary: GOING THRU THE CHANGE///KT  Medications Added PERCOCET 5-325 MG  TABS (OXYCODONE-ACETAMINOPHEN) Take 1-2 tablets by mouth every 6-8 hours as needed for back pain PROMETHAZINE HCL 25 MG  TABS (PROMETHAZINE HCL) Take 1 tablet by mouth every 8 hours as needed nausea/vomiting PERCOCET 5-325 MG  TABS (OXYCODONE-ACETAMINOPHEN) Take 1-2 tablets by mouth every 6-8 hours as needed for back pain PROMETHAZINE HCL 25 MG  TABS (PROMETHAZINE HCL) Take 1 tablet by mouth every 8 hours as needed nausea/vomiting        Vital Signs:  Patient Profile:   65 Years Old Female Height:     64.5 inches Weight:      285 pounds BMI:     48.34 BSA:     2.29 Temp:     97.5 degrees F oral Pulse rate:   80 / minute Pulse rhythm:   regular Resp:     20 per minute BP sitting:   144 / 100  (left arm) Cuff size:   large  Pt. in pain?   yes    Location:   hip    Intensity:   6    Type:       sharp  Vitals Entered By: Gaylyn Cheers RN (July 11, 2007 10:29 AM)              Is Patient Diabetic? Yes  Nutritional Status Detail non fasting CBG Result 268 CBG Device ID A  Does patient need assistance? Ambulation Normal     Chief Complaint:  pain rt hip radiating down rt leg; concerned about menopausal symptoms .  History of Present Illness: Here c/o 2 weeks of right buttock and right leg pain. Has been a problem in the past but not much of a problem until recently. Her Ortho, Dr.Scott Kendall([facility]), has not seen her for buttock pain in past.  Pt has known h/o urge incontinence but denies new bowel/bladder incontinence.  Has issues with hot flashes,vaginal dryness, and mood swings. Has been a problem for about 5 years.     Current Allergies (reviewed today): ! MOTRIN HYDROCHLOROTHIAZIDE  Past Medical History:    Reviewed history from 04/11/2007 and no changes required:       Current Problems:        DM, UNCOMPLICATED, TYPE II (ICD-250.00)       HYPERTENSION, BENIGN ESSENTIAL  (ICD-401.1)       ANEMIA DUE TO DIETARY IRON DEFICIENCY (ICD-280.1)       OSTEOARTHRITIS, GENERALIZED (ICD-715.00)         Past Surgical History:    Reviewed history from 04/11/2007 and no changes required:       s/p left TKR 2000       s/p right shoulder rotator cuff surgery 3/07       h/o flex sig per pt  ~2001.    Risk Factors:  Tobacco use:  never Passive smoke exposure:  no Drug use:  no HIV high-risk behavior:  no Caffeine use:  1 drinks per day Alcohol use:  no Exercise:  no Seatbelt use:  100 % Sun Exposure:  occasionally  Family History Risk Factors:    Family History of MI in females < 75 years old:  no    Family History of MI in males < 75 years old:  no   Review of Systems      See HPI   Physical Exam  General:     Well-developed,well-nourished,in no acute distress; alert,appropriate and cooperative throughout examination Msk:  Tender to palpation deep right buttock.Has pain down right leg to below knee intermittently. Equivocal straight-leg-raising test. Has nl strength to specific testing of lower extremity muscle groups. Normal reflexes. No clonus.  Diabetes Management Exam:    Foot Exam (with socks and/or shoes not present):       Sensory-Monofilament:          Left foot: abnormal          Right foot: abnormal    Impression & Recommendations:  Problem # 1:  SCIATICA, RIGHT (ICD-724.3) Toradol 60mg  IM today. The following medications were removed from the medication list:    Percocet 5-325 Mg Tabs (Oxycodone-acetaminophen) .[name]... Take 1-2 tablets by mouth every 6-8 hours as needed for back pain  Her updated medication list for this problem includes:    Arthrotec 75 75-200 Mg-mcg Tabs (Diclofenac-misoprostol) .[name]... 1 by mouth q12 hrs as needed pain    Tramadol Hcl 50 Mg Tabs (Tramadol hcl) .[name]... 1-2 tabs by mouth q6hrs as needed pain    Percocet 5-325 Mg Tabs (Oxycodone-acetaminophen) .[name]... Take 1-2 tablets by mouth every 6-8 hours as needed  for back pain Patient to discontinue tramadol while taking percocet. Orders: Ketorolac-Toradol 15mg  (W2956) Admin of Therapeutic Inj  intramuscular or subcutaneous (21308)   Problem # 2:  DM, UNCOMPLICATED, TYPE II (ICD-250.00) HgA1c= 8.7.  MD d/w patient that she should start once daily insulin for diabetic control. Her DM has not been controlled despite multiple medications and insulin is the next logical step. Pt continues to refuse starting insulin. She has a CPP scheduled for 07/23/2007 and MD wil again address with patient at that time. Her updated medication list for this problem includes:    Metformin Hcl 1000 Mg Tabs (Metformin hcl) .[name]... 1 by mouth bid    Actos 45 Mg Tabs (Pioglitazone hcl) .[name]... 1 poqam    Januvia 100 Mg Tabs (Sitagliptin phosphate) .[name]... 1 by mouth qam    Lisinopril 20 Mg Tabs (Lisinopril) .[name]... 1 by mouth qam  Orders: Hgb A1C (65784ON)   Complete Medication List: 1)  Albuterol 90 Mcg/act Aers (Albuterol) .... 2 puffs q4-6 hrs prn 2)  Metformin Hcl 1000 Mg Tabs (Metformin hcl) .[name].. 1 by mouth bid 3)  Actos 45 Mg Tabs (Pioglitazone hcl) .[name].. 1 poqam 4)  Januvia 100 Mg Tabs (Sitagliptin phosphate) .[name].. 1 by mouth qam 5)  Pepcid 20 Mg Tabs (Famotidine) .[name].. 1 by mouth bid 6)  Nasacort Aq 55 Mcg/act Aers (Triamcinolone acetonide(nasal)) .[name].. 1 spray qnostril qday 7)  Allegra 180 Mg Tabs (Fexofenadine hcl) .[name].. 1 by mouth qday 8)  Crestor 5 Mg Tabs (Rosuvastatin calcium) .[name].. 1 by mouth qday 9)  Arthrotec 75 75-200 Mg-mcg Tabs (Diclofenac-misoprostol) .[name].. 1 by mouth q12 hrs as needed pain 10)  Tramadol Hcl 50 Mg Tabs (Tramadol hcl) .[name].. 1-2 tabs by mouth q6hrs as needed pain 11)  Lisinopril 20 Mg Tabs (Lisinopril) .[name].. 1 by mouth qam 12)  Percocet 5-325 Mg Tabs (Oxycodone-acetaminophen) .... Take 1-2 tablets by mouth every 6-8 hours as needed for back pain 13)  Promethazine Hcl 25 Mg Tabs (Promethazine hcl) .... Take 1 tablet by mouth every 8 hours as needed  nausea/vomiting  Other Orders: Capillary Blood Glucose ([id number]) Fingerstick (84132)   Patient Instructions: 1)  Please schedule a follow-up appointment as needed.    Prescriptions: PROMETHAZINE HCL 25 MG  TABS (PROMETHAZINE HCL) Take 1 tablet by mouth every 8 hours as needed nausea/vomiting  #30 x 0   Entered and  Authorized by:   Beverley Fiedler MD   Signed by:   Beverley Fiedler MD on 07/11/2007   Method used:   Print then Give to Patient   RxID:   [id number] PERCOCET 5-325 MG  TABS (OXYCODONE-ACETAMINOPHEN) Take 1-2 tablets by mouth every 6-8 hours as needed for back pain  #60 x 0   Entered and Authorized by:   Beverley Fiedler MD   Signed by:   Beverley Fiedler MD on 07/11/2007   Method used:   Print then Give to Patient   RxID:   [id number] PROMETHAZINE HCL 25 MG  TABS (PROMETHAZINE HCL) Take 1 tablet by mouth every 8 hours as needed nausea/vomiting  #30 x 0   Entered and Authorized by:   Beverley Fiedler MD   Signed by:   Beverley Fiedler MD on 07/11/2007   Method used:   Print then Give to Patient   RxID:   [id number] PERCOCET 5-325 MG  TABS (OXYCODONE-ACETAMINOPHEN) Take 1-2 tablets by mouth every 6-8 hours as needed for back pain  #60 x 0   Entered and Authorized by:   Beverley Fiedler MD   Signed by:   Beverley Fiedler MD on 07/11/2007   Method used:   Print then Give to Patient   RxID:   [id number]  ]       Diabetic Foot Exam Foot Inspection Is there a history of a foot ulcer?              No Is there a foot ulcer now?              No Can the patient see the bottom of their feet?          Yes Are the shoes appropriate in style and fit?          Yes Is there swelling or an abnormal foot shape?          No Are the toenails long?                Yes Are the toenails thick?                Yes Are the toenails ingrown?              No Is there heavy callous build-up?              No Is there a claw toe deformity?                           No Is there elevated skin temperature?            No Is there limited ankle dorsiflexion?            No Is there foot or ankle muscle weakness?            No Do you have pain in calf while walking?           No         10-g (5.07) Semmes-Weinstein Monofilament Test           Right Foot          Left Foot Visual Inspection               Test Control      normal         normal Site 1         normal  normal Site 2         normal         normal Site 3         normal         normal Site 4         normal         normal Site 5         normal         normal Site 6         normal         normal Site 7         normal         normal Site 8         normal         normal Site 9         abnormal         abnormal Site 10         normal         normal  Impression      abnormal         abnormal    Laboratory Results   Blood Tests   Date/Time Received: July 11, 2007 10:52 AM  Date/Time Reported: July 11, 2007 10:52 AM   HGBA1C: 8.7%   (Normal Range: Non-Diabetic - 3-6%   Control Diabetic - 6-8%) CBG Random:: 268       Medication Administration  Injection # 1:    Medication: Ketorolac-Toradol 15mg     Diagnosis: SCIATICA, RIGHT (ICD-724.3)    Route: IM    Site: RUOQ gluteus    Exp Date: 04/10    Lot #: [id number]    Mfr: [email or web address]    Patient tolerated injection without complications    Given by: Gennaro Africa CMA  Orders Added: 1)  Capillary Blood Glucose [82948] 2)  Fingerstick [36416] 3)  Hgb A1C [83036QW] 4)  Ketorolac-Toradol 15mg  [J1885] 5)  Admin of Therapeutic Inj  intramuscular or subcutaneous [90772] 6)  Est. Patient Level III [[address]]

## 2010-09-30 NOTE — Letter (Signed)
Summary: *HSN Results Follow up  [facility]  [address] [place], [place] [address]   Phone: [phone]  Fax: [phone]      12/25/2009   [name] [address] [place], [place]  [address]   Dear  Ms. Katelyn [name],                            ____S.Drinkard,FNP   ____D. Gore,FNP       ____B. McPherson,MD   ____V. Rankins,MD    ____E. Mulberry,MD    ____N. Daphine Deutscher, FNP  ____D. Reche Dixon, MD    ____K. Philipp Deputy, MD    __x__S. Alben Spittle, PA-C     This letter is to inform you that your recent test(s):  _______Pap Smear    ___x____Lab Test     _______X-ray    ___x____ is within acceptable limits  _______ requires a medication change  _______ requires a follow-up lab visit  _______ requires a follow-up visit with your provider   Comments:       _________________________________________________________ If you have any questions, please contact our office                     Sincerely,  Tereso Newcomer PA-C [facility]

## 2010-09-30 NOTE — Letter (Signed)
Summary: DENTAL REFERRAL  DENTAL REFERRAL   Imported By: Arta Bruce 01/22/2010 09:42:12  _____________________________________________________________________  External Attachment:    Type:   Image     Comment:   External Document

## 2010-09-30 NOTE — Letter (Signed)
Summary: REFERRAL/ORTHOPEDIC/PT AWARE OF COST  REFERRAL/ORTHOPEDIC/PT AWARE OF COST   Imported By: Arta Bruce 02/10/2010 10:06:01  _____________________________________________________________________  External Attachment:    Type:   Image     Comment:   External Document

## 2010-09-30 NOTE — Progress Notes (Signed)
Summary: needs her meds updated  Phone Note Call from Patient Call back at [phone] CELL   Summary of Call: Katelyn [name] PT. SHE HAD HER CPP AND HAD HER LABS DONE, AND NOW SHE NEEDS HER TRAMADOL, CRESTOR, INHALER, NASAL SPRAY,GENERIC PEPCID AND ARTHROTEC CALLED IN TO [facility] Initial call taken by: Leodis Rains,  November 09, 2007 2:09 PM  Follow-up for Phone Call        Forward to Dr. Barbaraann Barthel Follow-up by: Vesta Mixer CMA,  November 09, 2007 4:19 PM  Additional Follow-up for Phone Call Additional follow up Details #1::        done and please notify patient to check with [facility] pharmacy. Additional Follow-up by: Beverley Fiedler MD,  November 10, 2007 11:30 AM    Additional Follow-up for Phone Call Additional follow up Details #2::    pt notified rx's sent to pharmacy. Follow-up by: Mikey College CMA,  November 10, 2007 12:42 PM    Prescriptions: LISINOPRIL 20 MG  TABS (LISINOPRIL) 1 by mouth daily for blood pressure  #30 x 5   Entered and Authorized by:   Beverley Fiedler MD   Signed by:   Beverley Fiedler MD on 11/10/2007   Method used:   Printed then faxed to ...         RxID:   [id number] TRAMADOL HCL 50 MG  TABS (TRAMADOL HCL) 1-2 tabs by mouth q6hrs as needed pain  #90 x 5   Entered and Authorized by:   Beverley Fiedler MD   Signed by:   Beverley Fiedler MD on 11/10/2007   Method used:   Printed then faxed to ...         RxID:   [id number] ARTHROTEC 75 75-200 MG-MCG  TABS (DICLOFENAC-MISOPROSTOL) 1 by mouth q12 hrs as needed pain  #60 x 5   Entered and Authorized by:   Beverley Fiedler MD   Signed by:   Beverley Fiedler MD on 11/10/2007   Method used:   Printed then faxed to ...         RxID:   [id number] CRESTOR 5 MG  TABS (ROSUVASTATIN CALCIUM) 1 by mouth nightly forcholesterol **Needs labs before more refills**  #30 x 5   Entered and Authorized by:   Beverley Fiedler MD   Signed by:   Beverley Fiedler MD on 11/10/2007   Method used:   Printed then faxed to  ...         RxID:   [id number] ALLEGRA 180 MG  TABS (FEXOFENADINE HCL) 1 by mouth daily  #30 x 5   Entered and Authorized by:   Beverley Fiedler MD   Signed by:   Beverley Fiedler MD on 11/10/2007   Method used:   Printed then faxed to ...         RxID:   [id number] NASACORT AQ 55 MCG/ACT  AERS (TRIAMCINOLONE ACETONIDE(NASAL)) 1 spray qnostril qday  #1 x 5   Entered and Authorized by:   Beverley Fiedler MD   Signed by:   Beverley Fiedler MD on 11/10/2007   Method used:   Printed then faxed to ...         RxID:   [id number] PEPCID 20 MG  TABS (FAMOTIDINE) 1 by mouth bid  #60 x 5   Entered and Authorized by:   Beverley Fiedler MD   Signed by:   Beverley Fiedler MD on 11/10/2007   Method used:   Printed then faxed to .Marland KitchenMarland Kitchen  RxID:   [id number] JANUVIA 100 MG  TABS (SITAGLIPTIN PHOSPHATE) 1 by mouth qam  #30 x 5   Entered and Authorized by:   Beverley Fiedler MD   Signed by:   Beverley Fiedler MD on 11/10/2007   Method used:   Printed then faxed to ...         RxID:   [id number] ACTOS 45 MG  TABS (PIOGLITAZONE HCL) 1 tablet by mouth daily for diabetes **Needs labs**  #30 x 5   Entered and Authorized by:   Beverley Fiedler MD   Signed by:   Beverley Fiedler MD on 11/10/2007   Method used:   Printed then faxed to ...         RxID:   [id number] METFORMIN HCL 1000 MG  TABS (METFORMIN HCL) 1 by mouth bid  #60 x 5   Entered and Authorized by:   Beverley Fiedler MD   Signed by:   Beverley Fiedler MD on 11/10/2007   Method used:   Printed then faxed to ...         RxID:   [id number]

## 2010-09-30 NOTE — Progress Notes (Signed)
Summary: Meds RF x 108month only/Needs to see MD  Phone Note Outgoing Call   Summary of Call: Rf request received from [facility] pharmacy. MD will RF meds x 1 month but this is her second notice that an MD appt is needed. No further refills after this 1x unless she is seen in office by a provider. Call pt and inform of need for appt. Initial call taken by: Beverley Fiedler MD,  October 04, 2008 8:02 PM  Follow-up for Phone Call        PT is aware and has appt on 2/22 @2pm . Follow-up by: Mikey College CMA,  October 05, 2008 10:23 AM      Prescriptions: ALLEGRA 180 MG  TABS (FEXOFENADINE HCL) 1 by mouth daily  #30 x 0   Entered and Authorized by:   Beverley Fiedler MD   Signed by:   Beverley Fiedler MD on 10/04/2008   Method used:   Printed then faxed to ...         RxID:   [id number] LASIX 20 MG  TABS (FUROSEMIDE) Take 1 tablet by mouth once a day  #30 x 0   Entered and Authorized by:   Beverley Fiedler MD   Signed by:   Beverley Fiedler MD on 10/04/2008   Method used:   Printed then faxed to ...         RxID:   [id number] LISINOPRIL 20 MG  TABS (LISINOPRIL) 1 by mouth daily for blood pressure  #30 x 0   Entered and Authorized by:   Beverley Fiedler MD   Signed by:   Beverley Fiedler MD on 10/04/2008   Method used:   Printed then faxed to ...         RxID:   [id number] TRAMADOL HCL 50 MG  TABS (TRAMADOL HCL) 1-2 tabs by mouth every 6 hours as needed pain  #90 x 0   Entered and Authorized by:   Beverley Fiedler MD   Signed by:   Beverley Fiedler MD on 10/04/2008   Method used:   Printed then faxed to ...         RxID:   [id number] ARTHROTEC 75 75-200 MG-MCG  TABS (DICLOFENAC-MISOPROSTOL) 1 by mouth every 12 hours as needed for pain  #60 x 0   Entered and Authorized by:   Beverley Fiedler MD   Signed by:   Beverley Fiedler MD on 10/04/2008   Method used:   Printed then faxed to ...         RxID:   [id number] CRESTOR 5 MG  TABS (ROSUVASTATIN CALCIUM)  1 by mouth nightly forcholesterol **Needs labs before more refills**  #30 x 0   Entered and Authorized by:   Beverley Fiedler MD   Signed by:   Beverley Fiedler MD on 10/04/2008   Method used:   Printed then faxed to ...         RxID:   [id number] NASACORT AQ 55 MCG/ACT  AERS (TRIAMCINOLONE ACETONIDE(NASAL)) 1 spray qnostril qday  #1 x 0   Entered and Authorized by:   Beverley Fiedler MD   Signed by:   Beverley Fiedler MD on 10/04/2008   Method used:   Printed then faxed to ...         RxID:   [id number] PEPCID 20 MG  TABS (FAMOTIDINE) 1 by mouth bid  #60 x 0   Entered and Authorized by:   Beverley Fiedler MD   Signed by:   Beverley Fiedler MD  on 10/04/2008   Method used:   Printed then faxed to ...         RxID:   [id number] JANUVIA 100 MG  TABS (SITAGLIPTIN PHOSPHATE) 1 by mouth qam  #30 x 0   Entered and Authorized by:   Beverley Fiedler MD   Signed by:   Beverley Fiedler MD on 10/04/2008   Method used:   Printed then faxed to ...         RxID:   [id number] ACTOS 45 MG  TABS (PIOGLITAZONE HCL) 1 tablet by mouth daily for diabetes **Needs labs**  #30 x 0   Entered and Authorized by:   Beverley Fiedler MD   Signed by:   Beverley Fiedler MD on 10/04/2008   Method used:   Printed then faxed to ...         RxID:   [id number] METFORMIN HCL 1000 MG  TABS (METFORMIN HCL) 1 by mouth bid  #60 x 0   Entered and Authorized by:   Beverley Fiedler MD   Signed by:   Beverley Fiedler MD on 10/04/2008   Method used:   Printed then faxed to ...         RxID:   [id number]

## 2010-09-30 NOTE — Assessment & Plan Note (Signed)
Summary: 3 MONTH FU FOR BP AND DIABETES//KT   Vital Signs:  Patient profile:   65 year old female Menstrual status:  postmenopausal Height:      65 inches Weight:      298 pounds BMI:     49.77 Temp:     98.9 degrees F oral Pulse rate:   97 / minute Pulse rhythm:   regular Resp:     18 per minute BP sitting:   139 / 80  (left arm) Cuff size:   large  Vitals Entered By: [name] (March 24, 2010 10:50 AM)  Serial Vital Signs/Assessments:  Time      Position  BP       Pulse  Resp  Temp     By 11:39 AM            124/60                         Tereso Newcomer PA-C  CC: follow-up visit, blood pressure and diabetes, Hypertension Management CBG Result 331  Does patient need assistance? Functional Status Self care Ambulation Normal   Primary Care Provider:  Tereso Newcomer PA-C  CC:  follow-up visit, blood pressure and diabetes, and Hypertension Management.  History of Present Illness: Here for f/u.  DJD:  Right knee severe DJD.  Saw Dr. Lequita Halt.  Wants to surgery.  She is trying to get vocational rehab to help her pay.  States pain meds making her mouth dry.  BUt, sounds like more related to zyrtec than percocet.  HTN:  Taking meds every day.  Congestion:  Notes a lot of nasal congestion.  Got zyrtec over the counter.  Was getting at [facility] in the past.  Using nasacort.  Still feels like she has a lot of nasal congestion.  Has been using Proventil b/c she thought that it may help . . . but it does not.  DM:  Sugars at home running 120s last few days.  Went to a couple birthday parties recently.  Usually in 70-80s.  Notes pain in toes.  She is seeing eye doctor.  Hypertension History:      She denies headache, chest pain, dyspnea with exertion, and syncope.        Positive major cardiovascular risk factors include female age 60 years old or older, diabetes, hyperlipidemia, and hypertension.  Negative major cardiovascular risk factors include negative family history for ischemic  heart disease and non-tobacco-user status.        Further assessment for target organ damage reveals no history of ASHD, stroke/TIA, or peripheral vascular disease.     Current Medications (verified): 1)  Proventil Hfa 108 (90 Base) Mcg/act Aers (Albuterol Sulfate) .[name].. 1-2 Puffs Every 4-6 Hours As Needed 2)  Metformin Hcl 1000 Mg  Tabs (Metformin Hcl) .... One Tablet By Mouth Two Times A Day For Blood Sugar 3)  Actos 45 Mg  Tabs (Pioglitazone Hcl) .[name].. 1 Tablet By Mouth Daily For Diabetes 4)  Januvia 100 Mg  Tabs (Sitagliptin Phosphate) .... One Tablet By Mouth Daily For Blood Sugar 5)  Pepcid 20 Mg  Tabs (Famotidine) .[name].. 1 By Mouth Bid 6)  Nasacort Aq 55 Mcg/act  Aers (Triamcinolone Acetonide(Nasal)) .[name].. 1 Spray Qnostril Qday 7)  Allegra 180 Mg  Tabs (Fexofenadine Hcl) .[name].. 1 By Mouth Daily 8)  Crestor 5 Mg  Tabs (Rosuvastatin Calcium) .[name].. 1 By Mouth Nightly Forcholesterol **needs Labs Before More Refills** 9)  Arthrotec 75  75-200 Mg-Mcg  Tabs (Diclofenac-Misoprostol) .[name].. 1 By Mouth Every 12 Hours As Needed For Pain 10)  Lisinopril 40 Mg  Tabs (Lisinopril) .[name].. 1 By Mouth Daily For Blood Pressure 11)  Lasix 20 Mg  Tabs (Furosemide) .... Take 1 Tablet By Mouth Once A Day 12)  Amlodipine Besylate 10 Mg Tabs (Amlodipine Besylate) .... Take 1 Tablet By Mouth Once A Day For Blood Pressure (Pharmacy Note Change in Dose) 13)  Percocet 5-325 Mg Tabs (Oxycodone-Acetaminophen) .[name].. 1-2 Tablets By Mouth Up To Every  8 Hours As Needed For Severe Pain 14)  Caltrate 600+d Plus 600-400 Mg-Unit Tabs (Calcium Carbonate-Vit D-Min) .... Take 1 Tablet By Mouth Two Times A Day 15)  Fish Oil 1000 Mg Caps (Omega-3 Fatty Acids) .... Take 1 Capsule By Mouth Once A Day  Allergies: 1)  ! Motrin 2)  ! Asa 3)  Hydrochlorothiazide  Physical Exam  General:  alert, well-developed, and well-nourished.   Head:  normocephalic and atraumatic.   Eyes:  pupils equal, pupils round, and pupils reactive to light.     Ears:  R ear normal and L ear normal.   Nose:  no mucosal edema.   Mouth:  pharynx pink and moist.   Neck:  supple.   Lungs:  normal breath sounds.   Heart:  normal rate and regular rhythm.   Abdomen:  soft.   Msk:  bilat onychomycosis  Extremities:  no edema Neurologic:  alert & oriented X3 and cranial nerves II-XII intact.   Psych:  normally interactive.    Diabetes Management Exam:    Foot Exam (with socks and/or shoes not present):       Sensory-Monofilament:          Left foot: normal          Right foot: normal   Impression & Recommendations:  Problem # 1:  DM, UNCOMPLICATED, TYPE II (ICD-250.00) check A1C today had rice this am and notes recent dietary indiscretion knows to get back on track she is est with eye doctor due to abnormal retasure last year  A1C 11.1 suggest she go back on Lantus come in to check machine with the nurse f/u with me in 6 weeks   Her updated medication list for this problem includes:    Metformin Hcl 1000 Mg Tabs (Metformin hcl) ..... One tablet by mouth two times a day for blood sugar    Actos 45 Mg Tabs (Pioglitazone hcl) .[name]... 1 tablet by mouth daily for diabetes    Januvia 100 Mg Tabs (Sitagliptin phosphate) ..... One tablet by mouth daily for blood sugar    Aspirin 81 Mg Tabs (Aspirin) .[name]... Take 1 tablet by mouth once a day    Lantus Solostar 100 Unit/ml Soln (Insulin glargine) ..... Inject 10 units at bedtime  Problem # 2:  HYPERTENSION (ICD-401.9) repeat optimal  continue current meds  Her updated medication list for this problem includes:    Lasix 20 Mg Tabs (Furosemide) .[name]... Take 1 tablet by mouth once a day    Amlodipine Besylate 10 Mg Tabs (Amlodipine besylate) .[name]... Take 1 tablet by mouth once a day for blood pressure (pharmacy note change in dose)  Problem # 3:  ONYCHOMYCOSIS, TOENAILS (ICD-110.1) needs help with nail trimming will get her into foot clinic at [facility]  Problem # 4:  OSTEOARTHRITIS (ICD-715.90) f/u with  Dr. Lequita Halt waiting on voc rehab so she can get surgery  Her updated medication list for this problem includes:    Arthrotec  75 75-200 Mg-mcg Tabs (Diclofenac-misoprostol) .[name]... 1 by mouth every 12 hours as needed for pain    Percocet 5-325 Mg Tabs (Oxycodone-acetaminophen) .[name]... 1-2 tablets by mouth up to every  8 hours as needed for severe pain    Aspirin 81 Mg Tabs (Aspirin) .[name]... Take 1 tablet by mouth once a day  Problem # 5:  Preventive Health Care (ICD-V70.0) will set up with Dr. Doreatha Martin for colo  Problem # 6:  ALLERGIC RHINITIS (ICD-477.9) suggest she use saline nose spray  Her updated medication list for this problem includes:    Nasacort Aq 55 Mcg/act Aers (Triamcinolone acetonide(nasal)) .[name]... 1 spray qnostril qday    Allegra 180 Mg Tabs (Fexofenadine hcl) .[name]... 1 by mouth daily  Complete Medication List: 1)  Proventil Hfa 108 (90 Base) Mcg/act Aers (Albuterol sulfate) .[name].. 1-2 puffs every 4-6 hours as needed 2)  Metformin Hcl 1000 Mg Tabs (Metformin hcl) .... One tablet by mouth two times a day for blood sugar 3)  Actos 45 Mg Tabs (Pioglitazone hcl) .[name].. 1 tablet by mouth daily for diabetes 4)  Januvia 100 Mg Tabs (Sitagliptin phosphate) .... One tablet by mouth daily for blood sugar 5)  Pepcid 20 Mg Tabs (Famotidine) .[name].. 1 by mouth bid 6)  Nasacort Aq 55 Mcg/act Aers (Triamcinolone acetonide(nasal)) .[name].. 1 spray qnostril qday 7)  Allegra 180 Mg Tabs (Fexofenadine hcl) .[name].. 1 by mouth daily 8)  Crestor 5 Mg Tabs (Rosuvastatin calcium) .[name].. 1 by mouth nightly forcholesterol **needs labs before more refills** 9)  Arthrotec 75 75-200 Mg-mcg Tabs (Diclofenac-misoprostol) .[name].. 1 by mouth every 12 hours as needed for pain 10)  Lisinopril 40 Mg Tabs (lisinopril)  .[name].. 1 by mouth daily for blood pressure 11)  Lasix 20 Mg Tabs (Furosemide) .... Take 1 tablet by mouth once a day 12)  Amlodipine Besylate 10 Mg Tabs (Amlodipine besylate) .... Take 1 tablet by mouth once a day for blood  pressure (pharmacy note change in dose) 13)  Percocet 5-325 Mg Tabs (Oxycodone-acetaminophen) .[name].. 1-2 tablets by mouth up to every  8 hours as needed for severe pain 14)  Caltrate 600+d Plus 600-400 Mg-unit Tabs (Calcium carbonate-vit d-min) .... Take 1 tablet by mouth two times a day 15)  Fish Oil 1000 Mg Caps (Omega-3 fatty acids) .... Take 1 capsule by mouth once a day 16)  Aspirin 81 Mg Tabs (Aspirin) .... Take 1 tablet by mouth once a day 17)  Lantus Solostar 100 Unit/ml Soln (Insulin glargine) .... Inject 10 units at bedtime  Other Orders: Capillary Blood Glucose/CBG (82948) Hemoglobin A1C ([address])  Hypertension Assessment/Plan:      The patient's hypertensive risk group is category C: Target organ damage and/or diabetes.  Her calculated 10 year risk of coronary heart disease is 17 %.  Today's blood pressure is 139/80.  Her blood pressure goal is < 130/80.  Patient Instructions: 1)  Schedule appointment with foot clinic at [facility]. (diabetic, needs nail trimming). 2)  Schedule lab visit to come in and have your glucose meter checked. 3)  Lantus insulin sent to [facility]. pharmacy. 4)  Start back on Lantus 10 units at bedtime. 5)  Check sugar three times a day before each meal.  Orlean Bradford list of sugars back to next appt. 6)  Schedule a follow up appointment with Tanner Vigna in 6 weeks. 7)  Schedule appointment with Drucilla Schmidt for diet instruction. Prescriptions: LANTUS SOLOSTAR 100 UNIT/ML SOLN (INSULIN GLARGINE) inject 10 units at bedtime  #1 mo supply  x 11   Entered and Authorized by:   Tereso Newcomer PA-C   Signed by:   Tereso Newcomer PA-C on 03/24/2010   Method used:   Faxed to ...       [facility] (retail)       [address] [place], [place]  [address]       Ph: [phone]       Fax: [phone]   RxID:   [id number] ARTHROTEC 75 75-200 MG-MCG  TABS (DICLOFENAC-MISOPROSTOL) 1 by mouth every 12 hours as needed for pain  #60 x 3    Entered and Authorized by:   Tereso Newcomer PA-C   Signed by:   Tereso Newcomer PA-C on 03/24/2010   Method used:   Faxed to ...       [facility] (retail)       [address] [place], [place]  [address]       Ph: [phone]       Fax: [phone]   RxID:   [id number] PERCOCET 5-325 MG TABS (OXYCODONE-ACETAMINOPHEN) 1-2 tablets by mouth up to every  8 hours as needed for severe pain  #60 x 0   Entered and Authorized by:   Tereso Newcomer PA-C   Signed by:   Tereso Newcomer PA-C on 03/24/2010   Method used:   Print then Give to Patient   RxID:   [id number]    Last LDL:                                                 49 (01/17/2010 9:53:00 PM)        Diabetic Foot Exam    10-g (5.07) Semmes-Weinstein Monofilament Test Performed by: [name]          Right Foot          Left Foot Visual Inspection               Test Control      normal         normal Site 1         normal         normal Site 2         normal         normal Site 3         normal         normal Site 4         normal         normal Site 5         normal         normal Site 6         normal         normal Site 7         normal         normal Site 8         normal         normal Site 9         normal         normal Site 10         normal         normal  Impression  normal         normal  Laboratory Results   Blood Tests     HGBA1C: 11.1%   (Normal Range: Non-Diabetic - 3-6%   Control Diabetic - 6-8%) CBG Random:: 331mg /dL

## 2010-09-30 NOTE — Assessment & Plan Note (Signed)
Summary: 1 WEEK FU TO ADJUST INSULIN///KT   Vital Signs:  Patient Profile:   65 Years Old Female Height:     64.5 inches Weight:      287 pounds BMI:     48.68 BSA:     2.30 Temp:     98.2 degrees F oral Pulse rate:   80 / minute Pulse rhythm:   regular Resp:     20 per minute BP sitting:   140 / 90  (left arm) Cuff size:   large  Pt. in pain?   yes    Location:   shoulders    Intensity:   8    Type:       aching  Vitals Entered By: [name] (October 29, 2008 9:36 AM)              Is Patient Diabetic? Yes CBG Result 174     Chief Complaint:  F/u    pt is concerned about this coming up surgery.....Katelyn [name]  History of Present Illness: Here for f/u on starting her Levemir. This visit was to be for insulin adjustent only. Pt now says she needs surgical clearance... Pt says to have surgery on left shoulder...saw Dr.[phone] in [place] and [phone] here in [place]) and was told needed to have surgery "as soon as cleared" for surgery. Says needs EKG and DM and HTN under control.  Pt says she needs new meter.Has not checked her BS in last 2 days. Says prior to that her CBGs 130-140's. Taking all her oral hypoglycemics and doing he insulin daily. Md discussed that I must see her BS records to establish that her DM is well-controlled in order to clear her for surgery.    Current Allergies: ! MOTRIN HYDROCHLOROTHIAZIDE     Review of Systems  The patient denies anorexia, chest pain, syncope, dyspnea on exertion, prolonged cough, melena, and hematochezia.     Physical Exam  General:     Well-developed,well-nourished,in no acute distress; alert,appropriate and cooperative throughout examination Lungs:     Normal respiratory effort, chest expands symmetrically. Lungs are clear to auscultation, no crackles or wheezes. Heart:     Normal rate and regular rhythm. S1 and S2 normal without gallop, murmur, click, rub or other extra sounds.  Diabetes Management Exam:  Foot Exam (with socks and/or shoes not present):       Sensory-Monofilament:          Left foot: normal          Right foot: normal    Impression & Recommendations:  Problem # 1:  DM, UNCOMPLICATED, TYPE II (ICD-250.00) Must bring in her CBG record for review. Recent labwork was reviewed with patient. Her updated medication list for this problem includes:    Metformin Hcl 1000 Mg Tabs (Metformin hcl) .Katelyn [name]... 1 by mouth bid    Actos 45 Mg Tabs (Pioglitazone hcl) .Katelyn [name]... 1 tablet by mouth daily for diabetes **needs labs**    Januvia 100 Mg Tabs (Sitagliptin phosphate) .Katelyn [name]... 1 by mouth qam    Lisinopril 20 Mg Tabs (Lisinopril) .Katelyn [name]... 1 by mouth daily for blood pressure    Levemir Flexpen 100 Unit/ml Soln (Insulin detemir) ..... Inject 20 units subcutaneously at bedtime   Problem # 2:  HYPERTENSION, BENIGN ESSENTIAL (ICD-401.1)  Her updated medication list for this problem includes:    Lisinopril 20 Mg Tabs (Lisinopril) .Katelyn [name]... 1 by mouth daily for blood pressure    Lasix 20 Mg Tabs (Furosemide) .Katelyn [name]... Take 1 tablet  by mouth once a day   Problem # 3:  OSTEOARTHRITIS, GENERALIZED (ICD-715.00) Pt says she is to have left shoulder surgery(?arthroscopy) as soon as "cleared for surgery". We discussed that she did just have a CPE and that she must show good BS control inorder to be cleared... She is to get glucometer working and f/u with nurse next week for CBG review and baseline EKG. Her updated medication list for this problem includes:    Arthrotec 75 75-200 Mg-mcg Tabs (Diclofenac-misoprostol) .Katelyn [name]... 1 by mouth every 12 hours as needed for pain    Tramadol Hcl 50 Mg Tabs (Tramadol hcl) .Katelyn [name]... 1-2 tabs by mouth every 6 hours as needed pain   Complete Medication List: 1)  Albuterol 90 Mcg/act Aers (Albuterol) .... 2 puffs q4-6 hrs prn 2)  Metformin Hcl 1000 Mg Tabs (Metformin hcl) .Katelyn [name].. 1 by mouth bid 3)  Actos 45 Mg Tabs (Pioglitazone hcl) .Katelyn [name].. 1 tablet by mouth daily for diabetes **needs  labs** 4)  Januvia 100 Mg Tabs (Sitagliptin phosphate) .Katelyn [name].. 1 by mouth qam 5)  Pepcid 20 Mg Tabs (Famotidine) .Katelyn [name].. 1 by mouth bid 6)  Nasacort Aq 55 Mcg/act Aers (Triamcinolone acetonide(nasal)) .Katelyn [name].. 1 spray qnostril qday 7)  Allegra 180 Mg Tabs (Fexofenadine hcl) .Katelyn [name].. 1 by mouth daily 8)  Crestor 5 Mg Tabs (Rosuvastatin calcium) .Katelyn [name].. 1 by mouth nightly forcholesterol **needs labs before more refills** 9)  Arthrotec 75 75-200 Mg-mcg Tabs (Diclofenac-misoprostol) .Katelyn [name].. 1 by mouth every 12 hours as needed for pain 10)  Tramadol Hcl 50 Mg Tabs (Tramadol hcl) .Katelyn [name].. 1-2 tabs by mouth every 6 hours as needed pain 11)  Lisinopril 20 Mg Tabs (Lisinopril) .Katelyn [name].. 1 by mouth daily for blood pressure 12)  Lasix 20 Mg Tabs (Furosemide) .... Take 1 tablet by mouth once a day 13)  Levemir Flexpen 100 Unit/ml Soln (Insulin detemir) .... Inject 20 units subcutaneously at bedtime   Patient Instructions: 1)  Schedule to see Susie Piper for diabetes and insulin review and to check her glucometer. 2)  Schedule her to see Nurse next week for CBG review and baseline EKG(pre-op). 3)  Handicap form completed for pt.     Last LDL:                                                 67 (10/24/2007 9:33:00 PM)        Diabetic Foot Exam    10-g (5.07) Semmes-Weinstein Monofilament Test Performed by: [name]          Right Foot          Left Foot Visual Inspection               Test Control      normal         normal Site 1         normal         normal Site 2         normal         normal Site 3         normal         normal Site 4         normal         normal Site 5         normal  normal Site 6         normal         normal Site 7         normal         normal Site 8         normal         normal Site 9         normal         normal Site 10         normal         normal  Impression      normal         normal    Laboratory Results   Blood Tests   Date/Time Received: October 29, 2008  10:01 AM  Date/Time Reported: October 29, 2008 10:01 AM   HGBA1C: 10.7%   (Normal Range: Non-Diabetic - 3-6%   Control Diabetic - 6-8%) CBG Random:: 174mg /dL

## 2010-09-30 NOTE — Progress Notes (Signed)
Summary: REFILL REQUEST  Phone Note Call from Patient Call back at Home Phone [phone]   Caller: Patient Call For: [phone] Summary of Call: The patient needs more refills from her tramadol 50 mg (90 pills) and arthtech 75 mg.  She is out of both medications and is requesting the provider to refills more for her. [phone] Dr. Barbaraann Barthel  Initial call taken by: Manon Hilding,  Jan 25, 2008 3:23 PM  Follow-up for Phone Call        ADVISED PT WILL FORWARD TO PROVIDER (PT IS AWARE THAT PROVIDER IS NOT IN OFFICE) Follow-up by: Mikey College CMA,  Jan 26, 2008 3:57 PM  Additional Follow-up for Phone Call Additional follow up Details #1::        She had both of these filled in March with 5 refills--please clarify with pharmacy how many refills left Additional Follow-up by: Julieanne Manson MD,  Jan 26, 2008 9:15 PM    Additional Follow-up for Phone Call Additional follow up Details #2::    Pt thought her eligibility was expired, but apparently it is not.  That is why she [facility]  She will get her refill at [facility] pharmacy. Follow-up by: Vesta Mixer CMA,  Jan 27, 2008 11:00 AM

## 2010-09-30 NOTE — Progress Notes (Signed)
Summary: When is her cardiology referral?  Phone Note Outgoing Call   Summary of Call: CMA Patient needs her cardiology referral done. Please set up for next 2-3 weeks as she is waiting on clearance for shoulder surgery(I thought [place] might have set up while patient was here??). Her CXR and cholesterol panel were fine. Initial call taken by: Beverley Fiedler MD,  December 19, 2008 6:07 AM  Follow-up for Phone Call        sorry did not work on it that day.... but am working on it now.........Marland KitchenArmenia Shannon  December 19, 2008 8:13 AM   Additional Follow-up for Phone Call Additional follow up Details #1::        [place] scheduled appt for 12/25/08 @2 :30pm Additional Follow-up by: Mikey College CMA,  December 26, 2008 12:12 PM

## 2010-09-30 NOTE — Letter (Signed)
Summary: REFERRAL FORM/PODIATRY/APPT DATE & TIME  REFERRAL FORM/PODIATRY/APPT DATE & TIME   Imported By: Arta Bruce 04/09/2009 12:36:08  _____________________________________________________________________  External Attachment:    Type:   Image     Comment:   External Document

## 2010-09-30 NOTE — Assessment & Plan Note (Signed)
Summary: FLU SHOT  / NS  Nurse Visit   Allergies: 1)  ! Motrin 2)  ! Asa 3)  Hydrochlorothiazide  Immunizations Administered:  Influenza Vaccine # 1:    Vaccine Type: Fluvax 3+    Site: left deltoid    Mfr: [employer]oSmithKline    Dose: 0.5 ml    Route: IM    Given by: Gaylyn Cheers RN    Exp. Date: 02/28/2011    Lot #: [id number]    VIS given: 03/25/10 version given June 18, 2010.  Flu Vaccine Consent Questions:    Do you have a history of severe allergic reactions to this vaccine? no    Any prior history of allergic reactions to egg and/or gelatin? no    Do you have a sensitivity to the preservative Thimersol? no    Do you have a past history of Guillan-Barre Syndrome? no    Do you currently have an acute febrile illness? no    Have you ever had a severe reaction to latex? no    Vaccine information given and explained to patient? yes    Are you currently pregnant? no  Orders Added: 1)  Flu Vaccine 53yrs + [90658] 2)  Admin 1st Vaccine [90471] 3)  Est. Patient Nurse visit [09003]

## 2010-09-30 NOTE — Assessment & Plan Note (Signed)
Summary: 2 WEEK FU ON FINGER///KT   Vital Signs:  Patient profile:   65 year old female Menstrual status:  postmenopausal Height:      65 inches Weight:      301 pounds BMI:     50.27 Temp:     97.6 degrees F oral Pulse rate:   80 / minute Pulse rhythm:   regular Resp:     20 per minute BP sitting:   154 / 80  (left arm) Cuff size:   large  Vitals Entered By: [name] (July 01, 2010 11:53 AM) CC: F/U ON FINGER Is Patient Diabetic? No Pain Assessment Patient in pain? no       Does patient need assistance? Functional Status Self care Ambulation Normal   Primary Care Provider:  Tereso Newcomer PA-C  CC:  F/U ON FINGER.  History of Present Illness: Has full ROM  and function of 5th finger without triggering.  No concerns.    Current Medications (verified): 1)  Proventil Hfa 108 (90 Base) Mcg/act Aers (Albuterol Sulfate) .[name].. 1-2 Puffs Every 4-6 Hours As Needed 2)  Metformin Hcl 1000 Mg  Tabs (Metformin Hcl) .... One Tablet By Mouth Two Times A Day For Blood Sugar 3)  Actos 45 Mg  Tabs (Pioglitazone Hcl) .[name].. 1 Tablet By Mouth Daily For Diabetes 4)  Januvia 100 Mg  Tabs (Sitagliptin Phosphate) .... One Tablet By Mouth Daily For Blood Sugar 5)  Pepcid 20 Mg  Tabs (Famotidine) .[name].. 1 By Mouth Bid 6)  Nasacort Aq 55 Mcg/act  Aers (Triamcinolone Acetonide(Nasal)) .[name].. 1 Spray Qnostril Qday 7)  Allegra 180 Mg  Tabs (Fexofenadine Hcl) .[name].. 1 By Mouth Daily 8)  Crestor 5 Mg  Tabs (Rosuvastatin Calcium) .[name].. 1 By Mouth Nightly Forcholesterol **needs Labs Before More Refills** 9)  Arthrotec 75 75-200 Mg-Mcg  Tabs (Diclofenac-Misoprostol) .[name].. 1 By Mouth Every 12 Hours As Needed For Pain 10)  Lisinopril 40 Mg  Tabs (Lisinopril) .[name].. 1 By Mouth Daily For Blood Pressure 11)  Lasix 20 Mg  Tabs (Furosemide) .... Take 1 Tablet By Mouth Once A Day 12)  Amlodipine Besylate 10 Mg Tabs (Amlodipine Besylate) .... Take 1 Tablet By Mouth Once A Day For Blood Pressure (Pharmacy Note  Change in Dose) 13)  Percocet 5-325 Mg Tabs (Oxycodone-Acetaminophen) .[name].. 1-2 Tablets By Mouth Up To Every  8 Hours As Needed For Severe Pain 14)  Caltrate 600+d Plus 600-400 Mg-Unit Tabs (Calcium Carbonate-Vit D-Min) .... Take 1 Tablet By Mouth Two Times A Day 15)  Fish Oil 1000 Mg Caps (Omega-3 Fatty Acids) .... Take 1 Capsule By Mouth Once A Day 16)  Aspirin 81 Mg Tabs (Aspirin) .... Take 1 Tablet By Mouth Once A Day 17)  Lantus Solostar 100 Unit/ml Soln (Insulin Glargine) .... Inject 10 Units At Bedtime  Allergies (verified): 1)  ! Motrin 2)  ! Asa 3)  Hydrochlorothiazide  Physical Exam  Extremities:  Able to flex and extend 5th finger without triggering.  No palpable pulley today, area nontender   Impression & Recommendations:  Problem # 1:  TRIGGER FINGER (ICD-727.03) Resolved. To call if recurs  Complete Medication List: 1)  Proventil Hfa 108 (90 Base) Mcg/act Aers (Albuterol sulfate) .[name].. 1-2 puffs every 4-6 hours as needed 2)  Metformin Hcl 1000 Mg Tabs (Metformin hcl) .... One tablet by mouth two times a day for blood sugar 3)  Actos 45 Mg Tabs (Pioglitazone hcl) .[name].. 1 tablet by mouth daily for diabetes 4)  Januvia  100 Mg Tabs (Sitagliptin phosphate) .... One tablet by mouth daily for blood sugar 5)  Pepcid 20 Mg Tabs (Famotidine) .[name].. 1 by mouth bid 6)  Nasacort Aq 55 Mcg/act Aers (Triamcinolone acetonide(nasal)) .[name].. 1 spray qnostril qday 7)  Allegra 180 Mg Tabs (Fexofenadine hcl) .[name].. 1 by mouth daily 8)  Crestor 5 Mg Tabs (Rosuvastatin calcium) .[name].. 1 by mouth nightly forcholesterol **needs labs before more refills** 9)  Arthrotec 75 75-200 Mg-mcg Tabs (Diclofenac-misoprostol) .[name].. 1 by mouth every 12 hours as needed for pain 10)  Lisinopril 40 Mg Tabs (lisinopril)  .[name].. 1 by mouth daily for blood pressure 11)  Lasix 20 Mg Tabs (Furosemide) .... Take 1 tablet by mouth once a day 12)  Amlodipine Besylate 10 Mg Tabs (Amlodipine besylate) .... Take 1 tablet by mouth  once a day for blood pressure (pharmacy note change in dose) 13)  Percocet 5-325 Mg Tabs (Oxycodone-acetaminophen) .[name].. 1-2 tablets by mouth up to every  8 hours as needed for severe pain 14)  Caltrate 600+d Plus 600-400 Mg-unit Tabs (Calcium carbonate-vit d-min) .... Take 1 tablet by mouth two times a day 15)  Fish Oil 1000 Mg Caps (Omega-3 fatty acids) .... Take 1 capsule by mouth once a day 16)  Aspirin 81 Mg Tabs (Aspirin) .... Take 1 tablet by mouth once a day 17)  Lantus Solostar 100 Unit/ml Soln (Insulin glargine) .... Inject 10 units at bedtime   Orders Added: 1)  Est. Patient Level I [[address]]

## 2010-09-30 NOTE — Progress Notes (Signed)
Summary: Needs Referral and Pain Med  Phone Note Refill Request   Refills Requested: Medication #1:  PERCOCET 5-325 MG TABS 1-2 tablets by mouth up to every  8 hours as needed for severe pain [facility] [address]   Method Requested: Fax to Local Pharmacy Initial call taken by: [name],  February 12, 2010 11:44 AM Summary of Call: PT NEEDS REFILLS FROM HER PAIN MEDICATION (OXYCOTIN) AND SHE NEEDS A REFERRAL ORTHOPEDIC BECAUSE SHE HAS KNEE PAIN Azeem Poorman PA-C Initial call taken by: Manon Hilding,  February 12, 2010 10:17 AM  Follow-up for Phone Call        spoke with pt about ortho referral and she let me know that it is a place she can go cheaper and she will get me the name of the place so we can schedule with them: [facility].[name] [facility]... [phone] Follow-up by: [name],  February 12, 2010 11:53 AM  Additional Follow-up for Phone Call Additional follow up Details #1::        last refill done on 01/15/2010 -  S.Kamil Mchaffie to refill pain meds on 02/14/2010 (Friday) then send note to D.Harland Dingwall to check into above mentioned rehab Additional Follow-up by: Lehman Prom FNP,  February 12, 2010 5:10 PM    Additional Follow-up for Phone Call Additional follow up Details #2::    Rx in basket. Fax to her pharmacy February 14, 2010. Tereso Newcomer PA-C  February 13, 2010 9:32 AM   Additional Follow-up for Phone Call Additional follow up Details #3:: Details for Additional Follow-up Action Taken: Stanton Kidney, See above. Can we get patient in to this clinic for her arthritis?  Voicemail not set up -- unable to notify pt. of faxed Rx.  Forwarded to D. Harland Dingwall.  Dutch Quint RN  February 13, 2010 3:43 PM No answer, voice box has not been set-up yet Gaylyn Cheers RN  February 14, 2010 10:51 AM Left message on answering machine to return call.  Dutch Quint RN  February 17, 2010 8:57 AM Spoke with pt. and advised that she's already gotten her Rx and update on referrals.  Dutch Quint RN  February 17, 2010 11:53 AM  Additional Follow-up by: Tereso Newcomer PA-C,  February 13, 2010 9:33 AM  Prescriptions: PERCOCET 5-325 MG TABS (OXYCODONE-ACETAMINOPHEN) 1-2 tablets by mouth up to every  8 hours as needed for severe pain  #60 x 0   Entered and Authorized by:   Tereso Newcomer PA-C   Signed by:   Tereso Newcomer PA-C on 02/13/2010   Method used:   Printed then faxed to ...       [facility] (retail)       [address] [place], [place]  [address]       Ph: [phone]       Fax: [phone]   RxID:   [id number]

## 2010-09-30 NOTE — Letter (Signed)
Summary: [facility]IL REQUESTED RECORDS TO [facility]  [facility]IL REQUESTED RECORDS TO [facility]   Imported By: Arta Bruce 04/10/2010 11:29:30  _____________________________________________________________________  External Attachment:    Type:   Image     Comment:   External Document

## 2010-09-30 NOTE — Letter (Signed)
Summary: Generic Letter  [facility]  [address] [place], [place] [address]   Phone: [phone]  Fax: [phone]    01/21/2009  [facility] [address] [place], [place]  [address]  Dear Katelyn [name]:  Katelyn [name] is cleared for orthopedic surgery. She now has her diabetes in much better control with a HgA1c of 7.4%. Her blood pressure is better controlled at 142/86. She has seen a cardiologist and been cleared from a cardiac standpoint for her surgery. I will enclose a copy of her recent cardiology consultation and cardiac echo.  Thank you for your patience as we made sure that [name] is ready for her surgery.   Sincerely,   Katelyn Jacie Tristan,MD           Sincerely,   Beverley Fiedler MD

## 2010-09-30 NOTE — Letter (Signed)
Summary: [facility]  [facility]   Imported By: Leodis Rains 03/25/2009 12:48:42  _____________________________________________________________________  External Attachment:    Type:   Image     Comment:   External Document

## 2010-09-30 NOTE — Letter (Signed)
Summary: NO SHOW/ELIGIBILITY  NO SHOW/ELIGIBILITY   Imported By: Arta Bruce 05/07/2009 12:45:39  _____________________________________________________________________  External Attachment:    Type:   Image     Comment:   External Document

## 2010-09-30 NOTE — Medication Information (Signed)
Summary: SCRIPT FOR MIRALAX  SCRIPT FOR MIRALAX   Imported By: Arta Bruce 04/09/2009 12:45:29  _____________________________________________________________________  External Attachment:    Type:   Image     Comment:   External Document

## 2010-09-30 NOTE — Letter (Signed)
Summary: MAILED REQUESTED RECORDS TO [facility]  MAILED REQUESTED RECORDS TO [facility]   Imported By: Arta Bruce 05/19/2010 14:32:37  _____________________________________________________________________  External Attachment:    Type:   Image     Comment:   External Document

## 2010-09-30 NOTE — Letter (Signed)
Summary: *HSN Results Follow up  [facility]  [address] [place], [place] [address]   Phone: [phone]  Fax: [phone]      05/19/2010   Katelyn [name] [address] [place], [place]  [address]   Dear  Ms. [name],                            ____S.Drinkard,FNP   ____D. Gore,FNP       ____B. McPherson,MD   ____V. Rankins,MD    ____E. Mulberry,MD    ____N. Daphine Deutscher, FNP  ____D. Reche Dixon, MD    ____K. Philipp Deputy, MD    __x__S. Alben Spittle, PA-C    This letter is to inform you that your recent test(s):  _______Pap Smear    _______Lab Test     ___x____Colonoscopy    _______ is within acceptable limits  _______ requires a medication change  _______ requires a follow-up lab visit  ___x____ requires a repeat Colonoscopy in 3 years.   Comments: You had a polyp.  This was benign (not cancer).  Dr. Corinda Gubler recommended you have another colonoscopy in 3 years.        _________________________________________________________ If you have any questions, please contact our office                     Sincerely,  Tereso Newcomer PA-C [facility]

## 2010-09-30 NOTE — Progress Notes (Signed)
Summary: REFILLS Percocet  Phone Note Refill Request   Refills Requested: Medication #1:  PERCOCET 5-325 MG TABS 1-2 tablets by mouth up to every  8 hours as needed for severe pain Initial call taken by: Domenic Polite,  June 23, 2010 2:43 PM  Follow-up for Phone Call        Rx printed and in basket  notify pt to come pick up Follow-up by: Lehman Prom FNP,  June 23, 2010 2:52 PM  Additional Follow-up for Phone Call Additional follow up Details #1::        [employer] unable to accept messages.  Dutch Quint RN  June 23, 2010 3:21 PM  Pt. notified.  Dutch Quint RN  June 24, 2010 10:56 AM     Prescriptions: PERCOCET 5-325 MG TABS (OXYCODONE-ACETAMINOPHEN) 1-2 tablets by mouth up to every  8 hours as needed for severe pain  #60 x 0   Entered and Authorized by:   Lehman Prom FNP   Signed by:   Lehman Prom FNP on 06/23/2010   Method used:   Print then Give to Patient   RxID:   [id number]

## 2010-09-30 NOTE — Progress Notes (Signed)
Summary: [facility] NEEDS ORDER  Phone Note Other Incoming Call back at [phone]   Caller: Karoline Caldwell AT DENTAL CLINIC Summary of Call: ANGIE FROM DENTAL NEEDS Tavaria Mackins TO FAX OVER IF MS Napoleon NEEDS TO BE PRE-MEDICACATED BECAUSE OF THE KNEE SURGERY THAT SHE HAD.  DENTAL FAX # IS [phone] Initial call taken by: Leodis Rains,  April 23, 2010 11:05 AM  Follow-up for Phone Call        forward to provider Follow-up by: [name],  April 23, 2010 11:49 AM  Additional Follow-up for Phone Call Additional follow up Details #1::        I don't have any record of her having surgery recently.  Find out when the surgery was that they are talking about. Antibiotic prophylaxis is generally not rec prior to dental procedures for prevention of prosthetic infections. If she has had a joint replacement in last 2 years, her surgeon should be asked if she needs antibiotics. Additional Follow-up by: Tereso Newcomer PA-C,  April 24, 2010 5:04 PM    Additional Follow-up for Phone Call Additional follow up Details #2::    ATTEPMTING TO CONTACT PT. NO ANSWER UNABLE TO LEAVE MSG..CM     angie wanted to know if pt needs to be pre-medicated before extracting her tooth because of the knee surgery she had eleven years ago.... angie wants [place] fax from doctor stating yes or no.... [name]  April 29, 2010 11:50 AM   Additional Follow-up for Phone Call Additional follow up Details #3:: Details for Additional Follow-up Action Taken: Rx is in basket to fax Tereso Newcomer PA-C  April 29, 2010 4:42 PM   script is faxed..... [name]  April 30, 2010 8:58 AM   Prescriptions: TO WHOM IT MAY CONCERN The patient does not require antibiotics prior to dental work.  #0 x 0   Entered and Authorized by:   Tereso Newcomer PA-C   Signed by:   Tereso Newcomer PA-C on 04/29/2010   Method used:   Print then Give to Patient   RxID:   [id number]

## 2010-09-30 NOTE — Letter (Signed)
Summary: *HSN Results Follow up  [facility]  [address] [place], [place] [address]   Phone: [phone]  Fax: [phone]      10/29/2008   ANAELI CORNWALL [address] [place], [place]  [address]   Dear  Ms. Easton Broughton,                            ____S.Drinkard,FNP   ____D. Gore,FNP       ____B. McPherson,MD   __X__V. Lundyn Coste,MD    ____E. Mulberry,MD    ____N. Daphine Deutscher, FNP  ____D. Reche Dixon, MD    ____K. Philipp Deputy, MD    ____Other     This letter is to inform you that your recent test(s):  __X_____Pap Smear    ____X___Lab Test     _______X-ray    __X_____ is within acceptable limits  _______ requires a medication change  _______ requires a follow-up lab visit  _______ requires a follow-up visit with your provider   Comments:Please keep your f/u appointments to get that diabetes in control :)       _________________________________________________________ If you have any questions, please contact our office                     Sincerely,  Beverley Fiedler MD [facility]

## 2010-09-30 NOTE — Progress Notes (Signed)
Summary: TRAMADOL CAUSING HER TO FEEL SISCK///KT  Phone Note Call from Patient Call back at [facility] Phone [phone]   Summary of Call: Mackenzi Krogh PT. MS Simonetti CALLED AND SAYS THAT THE TRAMADOL THAT SHE HAS NOW, SHE GOT IT AT [facility], IS CAUSING HER TO VOMIT, NASUATED AND CAUSING HER STOMACH CRAMPING, AND CAUSING HER TO BE REAL DROWSY. AND THIS IS THE SAME MEDICINE THAT THE HEALTH DEPT. HAS. SHE SAYS THAT THE PHARMACIST TOLD HER THAT ITS PROBABLY THE COATING OR THE DYE IN THE MED. THAT IS CAUSING HER TO HAVE THESE REACTIONS. MS Magri SAYS THAT SHE CAN'T TAKE THE TRAMADOL, SO SHE IS ASKING FOR SOMETHING FOR THE PAIN IN HER KNEE AND IF YOU HAVE TO CALL HER SOMETHING INTO [facility] ON [address] THAT WILL BE OK. SHE CANNOT IBUPROFEN OR ASPRIN IN IT.  SHE ONCE TOOK VICODEN THAT RANKINS PRESCIBED FOR HER SIATIC NERVE IN HER BACK AND SHE SAYS THAT HELPED. WOULD LIKE TO TRY THAT. Initial call taken by: Leodis Rains,  October 17, 2009 10:21 AM  Follow-up for Phone Call        spoke with pt and she let me know that the old drug company is not making the med anymore so this  med is going to come like this anywhere she goes...Marland KitchenMarland Kitchen pt woud like to know can she have some for the pain she is having since she is unable to take tramadol Follow-up by: [name],  October 17, 2009 11:47 AM  Additional Follow-up for Phone Call Additional follow up Details #1::        Ask our pharmacy if there is a way to get brand name Ultram. She has done well with this for years. If so, let me know and we should switch her to that.  Rx is on your desk for her to pick up for percocet, which is what Dr. Barbaraann Barthel gave her in the past.  She should try tylenol first.. She can take 500 mg 2 tablets every 6 hours. Arthrotec has something like ibuprofen in it.  This should be tried next when pain control is not adequate with tylenol. She should only use the percocet as a last resort afte the above when she has severe pain.   She can become dependent on it and it is addictive.  Therefore, she should avoid taking often.  Additional Follow-up by: Tereso Newcomer PA-C,  October 17, 2009 1:22 PM    Additional Follow-up for Phone Call Additional follow up Details #2::    pt is aware Follow-up by: [name],  October 17, 2009 2:29 PM  New/Updated Medications: PERCOCET 5-325 MG TABS (OXYCODONE-ACETAMINOPHEN) 1-2 tablets by mouth every 8 hours as needed for pain Prescriptions: PERCOCET 5-325 MG TABS (OXYCODONE-ACETAMINOPHEN) 1-2 tablets by mouth every 8 hours as needed for pain  #60 x 0   Entered and Authorized by:   Tereso Newcomer PA-C   Signed by:   Tereso Newcomer PA-C on 10/17/2009   Method used:   Print then Give to Patient   RxID:   [id number]

## 2010-09-30 NOTE — Letter (Signed)
Summary: MAILED REQUESTED RECORDS TO [facility]  MAILED REQUESTED RECORDS TO [facility]   Imported By: Arta Bruce 04/11/2010 12:06:40  _____________________________________________________________________  External Attachment:    Type:   Image     Comment:   External Document

## 2010-09-30 NOTE — Assessment & Plan Note (Signed)
Summary: CBG/EKG///KT  Nurse Visit    Prior Medications: ALBUTEROL 90 MCG/ACT  AERS (ALBUTEROL) 2 puffs q4-6 hrs prn METFORMIN HCL 1000 MG  TABS (METFORMIN HCL) 1 by mouth bid ACTOS 45 MG  TABS (PIOGLITAZONE HCL) 1 tablet by mouth daily for diabetes **Needs labs** JANUVIA 100 MG  TABS (SITAGLIPTIN PHOSPHATE) 1 by mouth qam PEPCID 20 MG  TABS (FAMOTIDINE) 1 by mouth bid NASACORT AQ 55 MCG/ACT  AERS (TRIAMCINOLONE ACETONIDE(NASAL)) 1 spray qnostril qday ALLEGRA 180 MG  TABS (FEXOFENADINE HCL) 1 by mouth daily CRESTOR 5 MG  TABS (ROSUVASTATIN CALCIUM) 1 by mouth nightly forcholesterol **Needs labs before more refills** ARTHROTEC 75 75-200 MG-MCG  TABS (DICLOFENAC-MISOPROSTOL) 1 by mouth every 12 hours as needed for pain TRAMADOL HCL 50 MG  TABS (TRAMADOL HCL) 1-2 tabs by mouth every 6 hours as needed pain LISINOPRIL 20 MG  TABS (LISINOPRIL) 1 by mouth daily for blood pressure LASIX 20 MG  TABS (FUROSEMIDE) Take 1 tablet by mouth once a day LEVEMIR FLEXPEN 100 UNIT/ML SOLN (INSULIN DETEMIR) Inject 20 units Subcutaneously at bedtime Current Allergies: ! MOTRIN HYDROCHLOROTHIAZIDE Laboratory Results       Orders Added: 1)  Est. Patient Level I [81191] 2)  EKG w/ Interpretation [93000]    ]

## 2010-09-30 NOTE — Assessment & Plan Note (Signed)
Summary: FU FROM 08/09 VISIT//KT   Vital Signs:  Patient profile:   65 year old female Height:      65 inches Weight:      289 pounds BMI:     48.27 Temp:     98.0 degrees F oral Pulse rate:   88 / minute Pulse rhythm:   regular Resp:     18 per minute BP sitting:   168 / 95  (left arm) Cuff size:   large  Vitals Entered By: [name] (September 24, 2009 11:30 AM) CC: pt wants to talk about losing weight...Marland KitchenMarland Kitchen pt wants a referral  for a denist...., Hypertension Management, Lipid Management Is Patient Diabetic? Yes Pain Assessment Patient in pain? no      CBG Result 120  Does patient need assistance? Functional Status Self care Ambulation Normal   CC:  pt wants to talk about losing weight...Marland KitchenMarland Kitchen pt wants a referral  for a denist...., Hypertension Management, and Lipid Management.  History of Present Illness: Here for f/u.  DM:  Recent A1C was 8.2.  Patient's mom died recently and she has been off track with diet.  She has had to do a lot of traveling throughout the state.  She is sure that she missed her meds on several occasions.  She had been on Levemir at one point but has had hypoglycemic episodes.  Since we called her she has been taking her meds regularly.  Sugars at home over last week or so avg. 120s. Never saw eye doctor.    Also, having dental pain.  Taking tramadol and arthrotec for it.  No fever.  Has a loose tooth that needs to come out.  Right knee pain:  Has DJD.  Had left TKR.  Told she need right TKR.  Xray in echart with mild DJD in 2007.  Pain and stiffness with walking.  Notes she takes arthrotec for the pain.  Hypertension History:      She denies headache, chest pain, dyspnea with exertion, and syncope.  She notes no problems with any antihypertensive medication side effects.        Positive major cardiovascular risk factors include female age 71 years old or older, diabetes, hyperlipidemia, and hypertension.  Negative major cardiovascular risk factors  include negative family history for ischemic heart disease and non-tobacco-user status.        Further assessment for target organ damage reveals no history of ASHD, stroke/TIA, or peripheral vascular disease.    Lipid Management History:      Positive NCEP/ATP III risk factors include female age 67 years old or older, diabetes, and hypertension.  Negative NCEP/ATP III risk factors include no family history for ischemic heart disease, non-tobacco-user status, no ASHD (atherosclerotic heart disease), no prior stroke/TIA, no peripheral vascular disease, and no history of aortic aneurysm.    Current Medications (verified): 1)  Albuterol 90 Mcg/act  Aers (Albuterol) .... 2 Puffs Q4-6 Hrs Prn 2)  Metformin Hcl 1000 Mg  Tabs (Metformin Hcl) .... One Tablet By Mouth Two Times A Day For Blood Sugar 3)  Actos 45 Mg  Tabs (Pioglitazone Hcl) .[name].. 1 Tablet By Mouth Daily For Diabetes 4)  Januvia 100 Mg  Tabs (Sitagliptin Phosphate) .... One Tablet By Mouth Daily For Blood Sugar 5)  Pepcid 20 Mg  Tabs (Famotidine) .[name].. 1 By Mouth Bid 6)  Nasacort Aq 55 Mcg/act  Aers (Triamcinolone Acetonide(Nasal)) .[name].. 1 Spray Qnostril Qday 7)  Allegra 180 Mg  Tabs (Fexofenadine Hcl) .Marland KitchenMarland KitchenMarland Kitchen 1  By Mouth Daily 8)  Crestor 5 Mg  Tabs (Rosuvastatin Calcium) .[name].. 1 By Mouth Nightly Forcholesterol **needs Labs Before More Refills** 9)  Arthrotec 75 75-200 Mg-Mcg  Tabs (Diclofenac-Misoprostol) .[name].. 1 By Mouth Every 12 Hours As Needed For Pain 10)  Tramadol Hcl 50 Mg  Tabs (Tramadol Hcl) .[name].. 1-2 Tabs By Mouth Every 6 Hours As Needed Pain 11)  Lisinopril 40 Mg  Tabs (Lisinopril) .[name].. 1 By Mouth Daily For Blood Pressure 12)  Lasix 20 Mg  Tabs (Furosemide) .... Take 1 Tablet By Mouth Once A Day  Allergies (verified): 1)  ! Motrin 2)  ! Asa 3)  Hydrochlorothiazide  Physical Exam  General:  alert, well-developed, and well-nourished.   Head:  normocephalic and atraumatic.   Neck:  supple.   Lungs:  normal breath sounds, no  crackles, and no wheezes.   Heart:  normal rate and regular rhythm.   Msk:  no callus or ulcers on feet crepitus right knee  Pulses:  DP/PT 2+ bilat Neurologic:  alert & oriented X3 and cranial nerves II-XII intact.   Psych:  normally interactive.     Impression & Recommendations:  Problem # 1:  DIABETIC  RETINOPATHY (ICD-250.50)  never got into eye specialist needs referral to ophthalmology  Her updated medication list for this problem includes:    Metformin Hcl 1000 Mg Tabs (Metformin hcl) ..... One tablet by mouth two times a day for blood sugar    Actos 45 Mg Tabs (Pioglitazone hcl) .[name]... 1 tablet by mouth daily for diabetes    Januvia 100 Mg Tabs (Sitagliptin phosphate) ..... One tablet by mouth daily for blood sugar  Orders: Ophthalmology Referral (Ophthalmology)  Problem # 2:  HYPERTENSION (ICD-401.9) Taking lisinopril 40 add amlodipine 5 mg once daily   Her updated medication list for this problem includes:    Lasix 20 Mg Tabs (Furosemide) .[name]... Take 1 tablet by mouth once a day    Amlodipine Besylate 5 Mg Tabs (Amlodipine besylate) .[name]... Take 1 tablet by mouth once a day for blood pressure  Problem # 3:  DM, UNCOMPLICATED, TYPE II (ICD-250.00) back on meds and her diet will hold off on med adjustments at this time  Her updated medication list for this problem includes:    Metformin Hcl 1000 Mg Tabs (Metformin hcl) ..... One tablet by mouth two times a day for blood sugar    Actos 45 Mg Tabs (Pioglitazone hcl) .[name]... 1 tablet by mouth daily for diabetes    Januvia 100 Mg Tabs (Sitagliptin phosphate) ..... One tablet by mouth daily for blood sugar  Orders: T-Urine Microalbumin w/creat. ratio [id number])  Problem # 4:  DYSLIPIDEMIA (ICD-272.4) check labs at [facility]  Her updated medication list for this problem includes:    Crestor 5 Mg Tabs (Rosuvastatin calcium) .[name]... 1 by mouth nightly forcholesterol **needs labs before more refills**  Problem # 5:   Preventive Health Care (ICD-V70.0) schedule [facility] needs colo . . . schedule at [facility] will need mammo in March flu shot today  Problem # 6:  OSTEOARTHRITIS, GENERALIZED (ICD-715.00)  has right knee pain  says she was told she needed TKR send for xray advised her to take arthrotec as needed only with diabetes and HTN try tylenol  Her updated medication list for this problem includes:    Arthrotec 75 75-200 Mg-mcg Tabs (Diclofenac-misoprostol) .[name]... 1 by mouth every 12 hours as needed for pain    Tramadol Hcl 50 Mg Tabs (Tramadol hcl) .[name]... 1-2 tabs by  mouth every 6 hours as needed pain  Orders: Diagnostic X-Ray/Fluoroscopy (Diagnostic X-Ray/Flu)  Problem # 7:  DENTAL CARIES (ICD-521.00)  has molar that is increasing in pain on left lower jaw taking pain meds for this pain as well refer to dental  Orders: Dental Referral (Dentist)  Complete Medication List: 1)  Albuterol 90 Mcg/act Aers (Albuterol) .... 2 puffs q4-6 hrs prn 2)  Metformin Hcl 1000 Mg Tabs (Metformin hcl) .... One tablet by mouth two times a day for blood sugar 3)  Actos 45 Mg Tabs (Pioglitazone hcl) .[name].. 1 tablet by mouth daily for diabetes 4)  Januvia 100 Mg Tabs (Sitagliptin phosphate) .... One tablet by mouth daily for blood sugar 5)  Pepcid 20 Mg Tabs (Famotidine) .[name].. 1 by mouth bid 6)  Nasacort Aq 55 Mcg/act Aers (Triamcinolone acetonide(nasal)) .[name].. 1 spray qnostril qday 7)  Allegra 180 Mg Tabs (Fexofenadine hcl) .[name].. 1 by mouth daily 8)  Crestor 5 Mg Tabs (Rosuvastatin calcium) .[name].. 1 by mouth nightly forcholesterol **needs labs before more refills** 9)  Arthrotec 75 75-200 Mg-mcg Tabs (Diclofenac-misoprostol) .[name].. 1 by mouth every 12 hours as needed for pain 10)  Tramadol Hcl 50 Mg Tabs (Tramadol hcl) .[name].. 1-2 tabs by mouth every 6 hours as needed pain 11)  Lisinopril 40 Mg Tabs (lisinopril)  .[name].. 1 by mouth daily for blood pressure 12)  Lasix 20 Mg Tabs (Furosemide) .... Take 1 tablet by mouth once a  day 13)  Amlodipine Besylate 5 Mg Tabs (Amlodipine besylate) .... Take 1 tablet by mouth once a day for blood pressure  Hypertension Assessment/Plan:      The patient's hypertensive risk group is category C: Target organ damage and/or diabetes.  Her calculated 10 year risk of coronary heart disease is 27 %.  Today's blood pressure is 168/95.  Her blood pressure goal is < 130/80.  Lipid Assessment/Plan:      Based on NCEP/ATP III, the patient's risk factor category is "history of diabetes".  The patient's lipid goals are as follows: Total cholesterol goal is 200; LDL cholesterol goal is 100; HDL cholesterol goal is 40; Triglyceride goal is 150.      Patient Instructions: 1)  Flu shot today. 2)  Return in 3 weeks for blood pressure check with the nurse. 3)  Please schedule a follow-up appointment in 2 months with Bassel Gaskill for [facility]. 4)  Take 650 - 1000 mg of tylenol every 4-6 hours as needed for relief of pain or comfort of fever. Avoid taking more than 4000 mg in a 24 hour period( can cause liver damage in higher doses).  Prescriptions: AMLODIPINE BESYLATE 5 MG TABS (AMLODIPINE BESYLATE) Take 1 tablet by mouth once a day for blood pressure  #30 x 5   Entered and Authorized by:   Tereso Newcomer PA-C   Signed by:   Tereso Newcomer PA-C on 09/24/2009   Method used:   Faxed to ...       [facility] (retail)       [address] [place], [place]  [address]       Ph: [phone]       Fax: [phone]   RxID:   [id number]   Laboratory Results   Blood Tests   Date/Time Received: September 24, 2009 11:50 AM   HGBA1C: 7.6%   (Normal Range: Non-Diabetic - 3-6%   Control Diabetic - 6-8%) CBG Random:: 120mg /dL

## 2010-09-30 NOTE — Letter (Signed)
Summary: influenza vaccination  influenza vaccination   Imported By: Arta Bruce 06/29/2007 10:48:41  _____________________________________________________________________  External Attachment:    Type:   Image     Comment:   External Document

## 2010-09-30 NOTE — Assessment & Plan Note (Signed)
Summary: CPP//////KT   Vital Signs:  Patient Profile:   65 Years Old Female Height:     64.5 inches Weight:      287.5 pounds BMI:     48.76 Temp:     97.4 degrees F Pulse rate:   84 / minute Pulse rhythm:   regular Resp:     20 per minute BP sitting:   140 / 80  (left arm) Cuff size:   large  Pt. in pain?   yes    Location:   rt knee    Intensity:   7  Vitals Entered By: Vesta Mixer CMA (October 22, 2008 2:02 PM)              Is Patient Diabetic? Yes CBG Result 141  Does patient need assistance? Ambulation Normal     Chief Complaint:  cpp.  History of Present Illness: Last saw a provider 12/08.Last blood tests 2/09. She is in a CPP slot. She reports compliance with meds and says only ran out if took longer than a few days to get her medicines filled. She says CBGs rarely over 200's but MD discussed that her HgA1c is 10.5% and that she has not had well-controlled BS. No mammogram in over a year. No h/o DEXA. Had flex sig  ~2001.No prior h/o colonoscopy.  MD discussd with her that as she is maxed on her current DM oral meds...the next step is 1x daily levemir.    Current Allergies (reviewed today): ! MOTRIN HYDROCHLOROTHIAZIDE  Past Medical History:    Reviewed history from 04/11/2007 and no changes required:       Current Problems:        DM, UNCOMPLICATED, TYPE II (ICD-250.00)       HYPERTENSION, BENIGN ESSENTIAL (ICD-401.1)       ANEMIA DUE TO DIETARY IRON DEFICIENCY (ICD-280.1)       OSTEOARTHRITIS, GENERALIZED (ICD-715.00)         Past Surgical History:    Reviewed history from 04/11/2007 and no changes required:       s/p left TKR 2000       s/p right shoulder rotator cuff surgery 3/07       h/o flex sig per pt  ~2001.      Physical Exam  General:     Well-developed,well-nourished,in no acute distress; alert,appropriate and cooperative throughout examination.Obese. Head:     Normocephalic and atraumatic without obvious abnormalities.  No apparent alopecia or balding. Eyes:     pupils equal, pupils round, pupils reactive to light, and no injection.   Ears:     External ear exam shows no significant lesions or deformities.  Otoscopic examination reveals clear canals, tympanic membranes are intact bilaterally without bulging, retraction, inflammation or discharge. Hearing is grossly normal bilaterally. Nose:     External nasal examination shows no deformity or inflammation. Nasal mucosa are pink and moist without lesions or exudates. Mouth:     Oral mucosa and oropharynx without lesions or exudates.  Teeth in good repair.pharynx pink and moist.   Neck:     supple, no thyromegaly, and no carotid bruits.   Breasts:     No mass, nodules, thickening, tenderness, bulging, retraction, inflamation, nipple discharge or skin changes noted.   Lungs:     Normal respiratory effort, chest expands symmetrically. Lungs are clear to auscultation, no crackles or wheezes. Heart:     Normal rate and regular rhythm. S1 and S2 normal without gallop, murmur, click, rub or other  extra sounds. Abdomen:     Bowel sounds positive,abdomen soft and non-tender without masses, organomegaly. Rectal:     No external abnormalities noted. Normal sphincter tone. No rectal masses or tenderness. Guiac neg/cntrl posit. Genitalia:     Pelvic Exam:        External: normal female genitalia without lesions or masses        Vagina: normal without lesions or masses        Cervix: normal without lesions or masses        Adnexa: normal bimanual exam without masses or fullness        Uterus: normal by palpation        Pap smear: performed        STD deferred low-risk. Msk:     No deformity or scoliosis noted of thoracic or lumbar spine.   Pulses:     R dorsalis pedis normal and L dorsalis pedis normal.   Extremities:     No CCE...no foot lesions on exam. Neurologic:     alert & oriented X3 and cranial nerves II-XII intact grossly.  Skin:     Intact without  suspicious lesions or rashes Cervical Nodes:     No lymphadenopathy noted Axillary Nodes:     No palpable lymphadenopathy Psych:     Oriented X3, memory intact for recent and remote, normally interactive, good eye contact, not anxious appearing, and not depressed appearing.    Diabetes Management Exam:    Foot Exam (with socks and/or shoes not present):       Sensory-Monofilament:          Left foot: normal          Right foot: normal    Impression & Recommendations:  Problem # 1:  EXAMINATION, ROUTINE MEDICAL (ICD-V70.0) mammogram and DEXA scan. Tdap not due until 2014. Pneumovax. Stool guiacs x 3 Referral for colonoscopy. PAP done. Monofilament. Orders: Thin Prep Pap (16109) Pap Smear, Thin Prep ( Collection of) (U0454) Hemoccult Guaiac-1 spec.(in office) (09811) Hemoccult Cards -3 specimans (take home) (82270) UA Dipstick w/o Micro (automated)  (81003)   Problem # 2:  DM, UNCOMPLICATED, TYPE II (ICD-250.00) HgA1c=10.4%. MD discussed with patient that diabetes is uncontrolled and that she needs to be seen every 4 months as a diabetic and will be more often until DM controlled. Will add Levemir pen insulin 20 units at bedtime. Watch diet and close medical f/u needed. Refer for Retasure. Her updated medication list for this problem includes:    Metformin Hcl 1000 Mg Tabs (Metformin hcl) .[name]... 1 by mouth bid    Actos 45 Mg Tabs (Pioglitazone hcl) .[name]... 1 tablet by mouth daily for diabetes **needs labs**    Januvia 100 Mg Tabs (Sitagliptin phosphate) .[name]... 1 by mouth qam    Lisinopril 20 Mg Tabs (Lisinopril) .[name]... 1 by mouth daily for blood pressure    Levemir Flexpen 100 Unit/ml Soln (Insulin detemir) ..... Inject 20 units subcutaneously at bedtime  Orders: T-General Health Panel (CBCD, CMP, TSH) ([id number]) Urine Microalbumin (21308) Gastroenterology Referral (GI) T-Urine Microalbumin w/creat. ratio (65784 / [id number])   Problem # 3:  HYPERTENSION, BENIGN  ESSENTIAL (ICD-401.1) May need increase inlisinopril dose at next f/u... Her updated medication list for this problem includes:    Lisinopril 20 Mg Tabs (Lisinopril) .[name]... 1 by mouth daily for blood pressure    Lasix 20 Mg Tabs (Furosemide) .[name]... Take 1 tablet by mouth once a day   Problem # 4:  OSTEOARTHRITIS, GENERALIZED (ICD-715.00)  Her updated medication list for this problem includes:    Arthrotec 75 75-200 Mg-mcg Tabs (Diclofenac-misoprostol) .[name]... 1 by mouth every 12 hours as needed for pain    Tramadol Hcl 50 Mg Tabs (Tramadol hcl) .[name]... 1-2 tabs by mouth every 6 hours as needed pain  Orders: Dexa scan (Dexa scan)   Complete Medication List: 1)  Albuterol 90 Mcg/act Aers (Albuterol) .... 2 puffs q4-6 hrs prn 2)  Metformin Hcl 1000 Mg Tabs (Metformin hcl) .[name].. 1 by mouth bid 3)  Actos 45 Mg Tabs (Pioglitazone hcl) .[name].. 1 tablet by mouth daily for diabetes **needs labs** 4)  Januvia 100 Mg Tabs (Sitagliptin phosphate) .[name].. 1 by mouth qam 5)  Pepcid 20 Mg Tabs (Famotidine) .[name].. 1 by mouth bid 6)  Nasacort Aq 55 Mcg/act Aers (Triamcinolone acetonide(nasal)) .[name].. 1 spray qnostril qday 7)  Allegra 180 Mg Tabs (Fexofenadine hcl) .[name].. 1 by mouth daily 8)  Crestor 5 Mg Tabs (Rosuvastatin calcium) .[name].. 1 by mouth nightly forcholesterol **needs labs before more refills** 9)  Arthrotec 75 75-200 Mg-mcg Tabs (Diclofenac-misoprostol) .[name].. 1 by mouth every 12 hours as needed for pain 10)  Tramadol Hcl 50 Mg Tabs (Tramadol hcl) .[name].. 1-2 tabs by mouth every 6 hours as needed pain 11)  Lisinopril 20 Mg Tabs (Lisinopril) .[name].. 1 by mouth daily for blood pressure 12)  Lasix 20 Mg Tabs (Furosemide) .... Take 1 tablet by mouth once a day 13)  Levemir Flexpen 100 Unit/ml Soln (Insulin detemir) .... Inject 20 units subcutaneously at bedtime  Other Orders: Capillary Blood Glucose (02725) Fingerstick (36644) Mammogram (Screening) (Mammo) Pneumococcal Vaccine (03474) Admin 1st Vaccine  (25956)   Patient Instructions: 1)  To see Dr.Everlene Cunning next week to adjust insulin dose. 2)  Your current Levemir pen dose is 20 units each night. 3)  You must check and record your blood sugars in AM and PM. 4)  You got a Pneumovax shot today(next one due in 2015) 5)  Your next Tetanus shot is due in 2014. 6)  Please get your mammogram and bone density study. 7)  You had a flexible sigmoidoscopy in 2001.You are due for a colonoscopy and we will be referring you to a gastroenterology clinic at [facility] in [place] for the study. 8)  Schedule Retasure. 9)  Stool cards x 3   Prescriptions: TRAMADOL HCL 50 MG  TABS (TRAMADOL HCL) 1-2 tabs by mouth every 6 hours as needed pain  #90 x 2   Entered and Authorized by:   Beverley Fiedler MD   Signed by:   Beverley Fiedler MD on 10/22/2008   Method used:   Print then Give to Patient   RxID:   [id number] ARTHROTEC 75 75-200 MG-MCG  TABS (DICLOFENAC-MISOPROSTOL) 1 by mouth every 12 hours as needed for pain  #60 x 2   Entered and Authorized by:   Beverley Fiedler MD   Signed by:   Beverley Fiedler MD on 10/22/2008   Method used:   Print then Give to Patient   RxID:   [id number] LASIX 20 MG  TABS (FUROSEMIDE) Take 1 tablet by mouth once a day  #30 x 4   Entered and Authorized by:   Beverley Fiedler MD   Signed by:   Beverley Fiedler MD on 10/22/2008   Method used:   Print then Give to Patient   RxID:   [id number] LISINOPRIL 20 MG  TABS (LISINOPRIL) 1 by mouth daily for blood pressure  #30 x 4   Entered and Authorized  by:   Beverley Fiedler MD   Signed by:   Beverley Fiedler MD on 10/22/2008   Method used:   Print then Give to Patient   RxID:   [id number] CRESTOR 5 MG  TABS (ROSUVASTATIN CALCIUM) 1 by mouth nightly forcholesterol **Needs labs before more refills**  #30 x 4   Entered and Authorized by:   Beverley Fiedler MD   Signed by:   Beverley Fiedler MD on 10/22/2008   Method used:   Print then Give to  Patient   RxID:   [id number] ALLEGRA 180 MG  TABS (FEXOFENADINE HCL) 1 by mouth daily  #30 x 4   Entered and Authorized by:   Beverley Fiedler MD   Signed by:   Beverley Fiedler MD on 10/22/2008   Method used:   Print then Give to Patient   RxID:   [id number] NASACORT AQ 55 MCG/ACT  AERS (TRIAMCINOLONE ACETONIDE(NASAL)) 1 spray qnostril qday  #1 x 4   Entered and Authorized by:   Beverley Fiedler MD   Signed by:   Beverley Fiedler MD on 10/22/2008   Method used:   Print then Give to Patient   RxID:   [id number] PEPCID 20 MG  TABS (FAMOTIDINE) 1 by mouth bid  #60 x 4   Entered and Authorized by:   Beverley Fiedler MD   Signed by:   Beverley Fiedler MD on 10/22/2008   Method used:   Print then Give to Patient   RxID:   [id number] JANUVIA 100 MG  TABS (SITAGLIPTIN PHOSPHATE) 1 by mouth qam  #30 x 4   Entered and Authorized by:   Beverley Fiedler MD   Signed by:   Beverley Fiedler MD on 10/22/2008   Method used:   Print then Give to Patient   RxID:   [id number] ACTOS 45 MG  TABS (PIOGLITAZONE HCL) 1 tablet by mouth daily for diabetes **Needs labs**  #30 x 4   Entered and Authorized by:   Beverley Fiedler MD   Signed by:   Beverley Fiedler MD on 10/22/2008   Method used:   Print then Give to Patient   RxID:   [id number] METFORMIN HCL 1000 MG  TABS (METFORMIN HCL) 1 by mouth bid  #60 x 4   Entered and Authorized by:   Beverley Fiedler MD   Signed by:   Beverley Fiedler MD on 10/22/2008   Method used:   Print then Give to Patient   RxID:   [id number] LEVEMIR FLEXPEN 100 UNIT/ML SOLN (INSULIN DETEMIR) Inject 20 units Subcutaneously at bedtime  # 1 month x 2   Entered and Authorized by:   Beverley Fiedler MD   Signed by:   Beverley Fiedler MD on 10/22/2008   Method used:   Print then Give to Patient   RxID:   [id number]   Laboratory Results   Urine Tests    Routine Urinalysis   Glucose: 100   (Normal Range: Negative) Bilirubin:  small   (Normal Range: Negative) Ketone: trace (5)   (Normal Range: Negative) Spec. Gravity: >=1.030   (Normal Range: 1.003-1.035) Blood: negative   (Normal Range: Negative) pH: 5.0   (Normal Range: 5.0-8.0) Protein: 100   (Normal Range: Negative) Urobilinogen: 0.2   (Normal Range: 0-1) Nitrite: negative   (Normal Range: Negative) Leukocyte Esterace: negative   (Normal Range: Negative)     Blood Tests     HGBA1C: 10.5%   (Normal Range: Non-Diabetic - 3-6%   Control Diabetic - 6-8%) CBG Random:: 141mg /dL  Pneumovax Vaccine    Vaccine Type: Pneumovax    Site: right deltoid    Mfr: San[employer]i Pasteur    Dose: 0.5 ml    Route: IM    Given by: Vesta Mixer CMA    Exp. Date: 09/28/2009    Lot #: [id number]    VIS given: 03/28/96 version given October 22, 2008.     Last LDL:                                                 67 (10/24/2007 9:33:00 PM)          Diabetic Foot Exam    10-g (5.07) Semmes-Weinstein Monofilament Test Performed by: Vesta Mixer CMA          Right Foot          Left Foot Visual Inspection               Test Control      normal         normal Site 1         normal         normal Site 2         normal         normal Site 3         normal         normal Site 4         normal         normal Site 5         normal         normal Site 6         normal         normal Site 7         normal         normal Site 8         normal         normal Site 9         normal         normal Site 10         normal         normal  Impression      normal         normal  Appended Document: CPP//////KT/hemmoccult results        Current Allergies: ! MOTRIN HYDROCHLOROTHIAZIDE        Complete Medication List: 1)  Albuterol 90 Mcg/act Aers (Albuterol) .... 2 puffs q4-6 hrs prn 2)  Metformin Hcl 1000 Mg Tabs (Metformin hcl) .[name].. 1 by mouth bid 3)  Actos 45 Mg Tabs (Pioglitazone hcl) .[name].. 1 tablet by mouth daily for diabetes **needs labs** 4)  Januvia  100 Mg Tabs (Sitagliptin phosphate) .[name].. 1 by mouth qam 5)  Pepcid 20 Mg Tabs (Famotidine) .[name].. 1 by mouth bid 6)  Nasacort Aq 55 Mcg/act Aers (Triamcinolone acetonide(nasal)) .[name].. 1 spray qnostril qday 7)  Allegra 180 Mg Tabs (Fexofenadine hcl) .[name].. 1 by mouth daily 8)  Crestor 5 Mg Tabs (Rosuvastatin calcium) .[name].. 1 by mouth nightly forcholesterol **needs labs before more refills** 9)  Arthrotec 75 75-200 Mg-mcg Tabs (Diclofenac-misoprostol) .[name].. 1 by mouth every 12 hours as needed for pain 10)  Tramadol Hcl 50 Mg Tabs (Tramadol hcl) .[name].. 1-2 tabs by mouth every 6 hours as needed pain  11)  Lisinopril 20 Mg Tabs (Lisinopril) .[name].. 1 by mouth daily for blood pressure 12)  Lasix 20 Mg Tabs (Furosemide) .... Take 1 tablet by mouth once a day 13)  Levemir Flexpen 100 Unit/ml Soln (Insulin detemir) .... Inject 20 units subcutaneously at bedtime      Laboratory Results  Date/Time Received: October 30, 2008 3:29 PM  Date/Time Reported: October 30, 2008 3:29 PM   Stool - Occult Blood Hemmoccult #1: negative Date: 10/30/2008 Hemoccult #2: negative Date: 10/30/2008 Hemoccult #3: negative Date: 10/30/2008

## 2010-09-30 NOTE — Progress Notes (Signed)
  Phone Note Call from Patient Call back at [facility] Phone [phone]   Summary of Call: The pt states that tramadol make her sick so she needs refills from her oxycodone medication.  Pt is aware that she called late for this prescription. Alben Spittle PA-c  Initial call taken by: Manon Hilding,  November 15, 2009 8:40 AM  Follow-up for Phone Call        forward to provider Follow-up by: [name],  November 15, 2009 10:33 AM  Additional Follow-up for Phone Call Additional follow up Details #1::        pt aware Additional Follow-up by: [name],  November 15, 2009 11:41 AM    New/Updated Medications: PERCOCET 5-325 MG TABS (OXYCODONE-ACETAMINOPHEN) 1-2 tablets by mouth up to every  8 hours as needed for severe pain Prescriptions: PERCOCET 5-325 MG TABS (OXYCODONE-ACETAMINOPHEN) 1-2 tablets by mouth up to every  8 hours as needed for severe pain  #60 x 0   Entered and Authorized by:   Tereso Newcomer PA-C   Signed by:   Tereso Newcomer PA-C on 11/15/2009   Method used:   Print then Give to Patient   RxID:   [id number]

## 2010-09-30 NOTE — Progress Notes (Signed)
Summary: DROPPED OFF BLOOD SUGAR READINGS  Phone Note Call from Patient Call back at [facility] Phone [phone]   Summary of Call: DR. Orby Tangen, MS. LABOISSERE DROPPED OFF HER BLOOD SUGAR READINGS FOR YOU. Initial call taken by: Leodis Rains,  November 09, 2008 2:03 PM  Follow-up for Phone Call        PUT IN YOUR OFFICE IN REFILL SLOT Follow-up by: Arta Bruce,  November 09, 2008 2:38 PM  Additional Follow-up for Phone Call Additional follow up Details #1::        [id number]. See 11/16/2008 phone note. Additional Follow-up by: Beverley Fiedler MD,  November 21, 2008 7:38 PM

## 2010-09-30 NOTE — Letter (Signed)
Summary: BLOOD SUGAR READINGS/FROM PATIENT  BLOOD SUGAR READINGS/FROM PATIENT   Imported By: Arta Bruce 11/27/2008 10:03:12  _____________________________________________________________________  External Attachment:    Type:   Image     Comment:   External Document

## 2010-09-30 NOTE — Progress Notes (Signed)
  Phone Note Other Incoming   Summary of Call: Retasure abnormal.  She needs to see ophthalmology ASAP.  Initial call taken by: Tereso Newcomer PA-C,  April 29, 2009 1:57 PM  Follow-up for Phone Call        Left message on answering machine for pt to call back...Marland KitchenMarland KitchenArmenia Shannon  May 01, 2009 12:09 PM   Additional Follow-up for Phone Call Additional follow up Details #1::        will do Additional Follow-up by: [name],  May 03, 2009 12:08 PM  New Problems: DIABETIC  RETINOPATHY (ICD-250.50)   New Problems: DIABETIC  RETINOPATHY (ICD-250.50)    Appended Document:  pt will try to get in with DSS for their medical eye care intake unit

## 2010-09-30 NOTE — Assessment & Plan Note (Signed)
Summary: FU PER Logun Colavito//EKG////KT   Vital Signs:  Patient profile:   65 year old female Weight:      277 pounds Temp:     97.4 degrees F Pulse rate:   84 / minute Pulse rhythm:   regular Resp:     18 per minute BP sitting:   192 / 100  (left arm) Cuff size:   large  Vitals Entered By: Vesta Mixer CMA (December 17, 2008 11:22 AM)  Serial Vital Signs/Assessments:  Time      Position  BP       Pulse  Resp  Temp     By 12:18 PM            170/96                         Beverley Fiedler MD  CC: f/u-  bp is elevated today Is Patient Diabetic? Yes  Pain Assessment Patient in pain? yes     Location: knees CBG Result 114  Does patient need assistance? Ambulation Normal   History of Present Illness: Pt is here today for surgical clearance for shoulder surgery(Dr.J.Kerr). Her BP today is 192/100 though she says she took her anti-hypertensive medications this AM. She feels fine and denies any cardiac or neurologic symptoms. See ROS. Her last BP in 10/2008 was 140/90.  Has been working hard on her DM and diet.Has lost 10 lbs. Her CBG record is much improved with the Levemir which she is doing daily. No low CBGs.  Allergies (verified): 1)  ! Motrin 2)  ! Asa 3)  Hydrochlorothiazide  Past History:  Past Medical History:    Reviewed history from 04/11/2007 and no changes required:    Current Problems:     DM, UNCOMPLICATED, TYPE II (ICD-250.00)    HYPERTENSION, BENIGN ESSENTIAL (ICD-401.1)    ANEMIA DUE TO DIETARY IRON DEFICIENCY (ICD-280.1)    OSTEOARTHRITIS, GENERALIZED (ICD-715.00)  Past Surgical History:    Reviewed history from 04/11/2007 and no changes required:    s/p left TKR 2000    s/p right shoulder rotator cuff surgery 3/07    h/o flex sig per pt  ~2001.  Review of Systems  The patient denies chest pain, syncope, dyspnea on exertion, peripheral edema, and prolonged cough.   CV:  Denies chest pain or discomfort, difficulty breathing at night, fainting,  fatigue, lightheadness, near fainting, palpitations, shortness of breath with exertion, swelling of feet, and weight gain; Does sleep with pillows for sinus drainage.[name]  Physical Exam  General:  Well-developed,well-nourished,in no acute distress; alert,appropriate and cooperative throughout examination. Obese.NAD. Lungs:  Normal respiratory effort, chest expands symmetrically. Lungs are clear to auscultation, no crackles or wheezes. Heart:  Normal rate and regular rhythm. S1 and S2 normal without gallop, murmur, click, rub or other extra sounds. Extremities:  No CCE Neurologic:  alert & oriented X3 and cranial nerves II-XII intact grossly.    Impression & Recommendations:  Problem # 1:  HYPERTENSION, BENIGN ESSENTIAL (ICD-401.1) MD discussed with patient that her BP alone would prevent surgical clearance. Her baseline EKG had Q waves in III and AVF(no prior comparison EKG available) and this with her HTN requires further cardiology evaluation and clearance prior to orthopedic surgery. Pt is needing refills on all her meds and I asked her to please check that she had all meds this AM(as her last BP just 3 weeks ago was much lower). Recheck today was 170/96. Her updated medication list for  this problem includes:    Lisinopril 20 Mg Tabs (Lisinopril) .[name]... 1 by mouth daily for blood pressure    Lasix 20 Mg Tabs (Furosemide) .[name]... Take 1 tablet by mouth once a day  Orders: T-Lipid Profile (52841-32440) CXR- 2view (CXR) Cardiology Referral (Cardiology)  Problem # 2:  ELECTROCARDIOGRAM, ABNORMAL (ICD-794.31)  Orders: Cardiology Referral (Cardiology)  Problem # 3:  DM, UNCOMPLICATED, TYPE II (ICD-250.00) Her CBG record looks good and she is working hard on weight loss(down 10 lbs).  Her updated medication list for this problem includes:    Metformin Hcl 1000 Mg Tabs (Metformin hcl) .[name]... 1 by mouth bid    Actos 45 Mg Tabs (Pioglitazone hcl) .[name]... 1 tablet by mouth daily for diabetes  **needs labs**    Januvia 100 Mg Tabs (Sitagliptin phosphate) .[name]... 1 by mouth qam    Lisinopril 20 Mg Tabs (Lisinopril) .[name]... 1 by mouth daily for blood pressure    Levemir Flexpen 100 Unit/ml Soln (Insulin detemir) ..... Inject 20 units subcutaneously at bedtime  Complete Medication List: 1)  Albuterol 90 Mcg/act Aers (Albuterol) .... 2 puffs q4-6 hrs prn 2)  Metformin Hcl 1000 Mg Tabs (Metformin hcl) .[name].. 1 by mouth bid 3)  Actos 45 Mg Tabs (Pioglitazone hcl) .[name].. 1 tablet by mouth daily for diabetes **needs labs** 4)  Januvia 100 Mg Tabs (Sitagliptin phosphate) .[name].. 1 by mouth qam 5)  Pepcid 20 Mg Tabs (Famotidine) .[name].. 1 by mouth bid 6)  Nasacort Aq 55 Mcg/act Aers (Triamcinolone acetonide(nasal)) .[name].. 1 spray qnostril qday 7)  Allegra 180 Mg Tabs (Fexofenadine hcl) .[name].. 1 by mouth daily 8)  Crestor 5 Mg Tabs (Rosuvastatin calcium) .[name].. 1 by mouth nightly forcholesterol **needs labs before more refills** 9)  Arthrotec 75 75-200 Mg-mcg Tabs (Diclofenac-misoprostol) .[name].. 1 by mouth every 12 hours as needed for pain 10)  Tramadol Hcl 50 Mg Tabs (Tramadol hcl) .[name].. 1-2 tabs by mouth every 6 hours as needed pain 11)  Lisinopril 20 Mg Tabs (Lisinopril) .[name].. 1 by mouth daily for blood pressure 12)  Lasix 20 Mg Tabs (Furosemide) .... Take 1 tablet by mouth once a day 13)  Levemir Flexpen 100 Unit/ml Soln (Insulin detemir) .... Inject 20 units subcutaneously at bedtime  Other Orders: Capillary Blood Glucose (10272) Fingerstick (53664)  Patient Instructions: 1)  Call if you have not received your cardiologist appointment  in 1 week . Prescriptions: PEPCID 20 MG  TABS (FAMOTIDINE) 1 by mouth bid  #60 x 4   Entered and Authorized by:   Beverley Fiedler MD   Signed by:   Beverley Fiedler MD on 12/17/2008   Method used:   Print then Give to Patient   RxID:   [id number] NASACORT AQ 55 MCG/ACT  AERS (TRIAMCINOLONE ACETONIDE(NASAL)) 1 spray qnostril qday  #1 x 4   Entered and Authorized  by:   Beverley Fiedler MD   Signed by:   Beverley Fiedler MD on 12/17/2008   Method used:   Print then Give to Patient   RxID:   [id number] CRESTOR 5 MG  TABS (ROSUVASTATIN CALCIUM) 1 by mouth nightly forcholesterol **Needs labs before more refills**  #30 x 4   Entered and Authorized by:   Beverley Fiedler MD   Signed by:   Beverley Fiedler MD on 12/17/2008   Method used:   Print then Give to Patient   RxID:   [id number] ARTHROTEC 75 75-200 MG-MCG  TABS (DICLOFENAC-MISOPROSTOL) 1 by mouth every 12 hours as needed for pain  #60 x  2   Entered and Authorized by:   Beverley Fiedler MD   Signed by:   Beverley Fiedler MD on 12/17/2008   Method used:   Print then Give to Patient   RxID:   [id number] TRAMADOL HCL 50 MG  TABS (TRAMADOL HCL) 1-2 tabs by mouth every 6 hours as needed pain  #90 x 2   Entered and Authorized by:   Beverley Fiedler MD   Signed by:   Beverley Fiedler MD on 12/17/2008   Method used:   Print then Give to Patient   RxID:   [id number] LISINOPRIL 20 MG  TABS (LISINOPRIL) 1 by mouth daily for blood pressure  #30 x 4   Entered and Authorized by:   Beverley Fiedler MD   Signed by:   Beverley Fiedler MD on 12/17/2008   Method used:   Print then Give to Patient   RxID:   [id number] LASIX 20 MG  TABS (FUROSEMIDE) Take 1 tablet by mouth once a day  #30 x 4   Entered and Authorized by:   Beverley Fiedler MD   Signed by:   Beverley Fiedler MD on 12/17/2008   Method used:   Print then Give to Patient   RxID:   [id number] LEVEMIR FLEXPEN 100 UNIT/ML SOLN (INSULIN DETEMIR) Inject 20 units Subcutaneously at bedtime  # 1 month x 3   Entered and Authorized by:   Beverley Fiedler MD   Signed by:   Beverley Fiedler MD on 12/17/2008   Method used:   Print then Give to Patient   RxID:   [id number]

## 2010-09-30 NOTE — Letter (Signed)
Summary: nutrition summay/susie  nutrition summay/susie   Imported By: Arta Bruce 05/26/2010 16:06:42  _____________________________________________________________________  External Attachment:    Type:   Image     Comment:   External Document

## 2010-09-30 NOTE — Progress Notes (Signed)
Summary: schedule appt  Phone Note Outgoing Call   Summary of Call: I briefly talked to patient about seeing Susie Piper. Ask her if she would like to make an appt with her.  If so, go ahead and set her up. Initial call taken by: Brynda Rim,  September 24, 2009 5:05 PM  Follow-up for Phone Call        Selena Batten, call pt and see if she would like to see Susie if so please schedule an appt. Follow-up by: Mikey College CMA,  September 27, 2009 10:52 AM  Additional Follow-up for Phone Call Additional follow up Details #1::        CALLED AND SPOKE WITH MS Sitzman AND SHE SAYS THAT SHE IS DOING OK AND WHEN SHE WANTS TO SCHEDULE SOMETHING SHE WILL CALL us TO DO THAT.   SHE IS ALSO ASKING ABOUT GETTING THE REFERRAL TO THE DENTAL CLINIC SINCE SHE IS ON PAIN MEDICINE. Additional Follow-up by: Leodis Rains,  September 27, 2009 3:07 PM    Additional Follow-up for Phone Call Additional follow up Details #2::    spoke with pt and she said last week when Harish Bram and her talk.... i advise pt that referral is in system.... [name]  September 30, 2009 9:55 AM

## 2010-09-30 NOTE — Progress Notes (Signed)
Summary: REFILLS  Phone Note Call from Patient Call back at Home Phone [phone]   Reason for Call: Refill Medication Summary of Call: Katelyn [name] PT. MS [name] SAYS SHE NEEDS A REFILL ON HER TRAMADOL AND TRTHRO TEC CALLED INTO [facility]. Initial call taken by: Leodis Rains,  September 04, 2009 1:05 PM  Follow-up for Phone Call        forward to provider Follow-up by: [name],  September 05, 2009 8:16 AM    Prescriptions: TRAMADOL HCL 50 MG  TABS (TRAMADOL HCL) 1-2 tabs by mouth every 6 hours as needed pain  #90 x 1   Entered and Authorized by:   Tereso Newcomer PA-C   Signed by:   Tereso Newcomer PA-C on 09/05/2009   Method used:   Faxed to ...       [facility] (retail)       [address] [place], [place]  [address]       Ph: [phone]       Fax: [phone]   RxID:   [id number] ARTHROTEC 75 75-200 MG-MCG  TABS (DICLOFENAC-MISOPROSTOL) 1 by mouth every 12 hours as needed for pain  #60 x 1   Entered and Authorized by:   Tereso Newcomer PA-C   Signed by:   Tereso Newcomer PA-C on 09/05/2009   Method used:   Faxed to ...       [facility] - [facility] (retail)       [address] [place], [place]  [address]       Ph: [phone]       Fax: [phone]   RxID:   [id number]

## 2010-09-30 NOTE — Progress Notes (Signed)
Summary: Proventil Refill  Phone Note Call from Patient Call back at [facility] Phone [phone] Call back at [phone]   Summary of Call: The pt needs more refills from albuterol because she is short of breath. [facility]) [place] Initial call taken by: Manon Hilding,  December 10, 2009 3:10 PM  Follow-up for Phone Call        forward to provider Follow-up by: [name],  December 11, 2009 10:59 AM  Additional Follow-up for Phone Call Additional follow up Details #1::        Is she having new symptoms?  Please call her and find out what is going on.  I don't see in her chart why she was prescribed albuterol in the past.  FInd out why she takes it. Additional Follow-up by: Tereso Newcomer PA-C,  December 11, 2009 1:21 PM    Additional Follow-up for Phone Call Additional follow up Details #2::    pt says its the pollen... pt says its been a year or two since she had one.... pt says she uses need it and  this time of the year and fall time...  New/Updated Medications: PROVENTIL HFA 108 (90 BASE) MCG/ACT AERS (ALBUTEROL SULFATE) 1-2 puffs every 4-6 hours as needed Prescriptions: PROVENTIL HFA 108 (90 BASE) MCG/ACT AERS (ALBUTEROL SULFATE) 1-2 puffs every 4-6 hours as needed  #1 x 1   Entered and Authorized by:   Tereso Newcomer PA-C   Signed by:   Tereso Newcomer PA-C on 12/11/2009   Method used:   Faxed to ...       [facility] (retail)       [address] [place], [place]  [address]       Ph: [phone]       Fax: [phone]   RxID:   [id number]

## 2010-09-30 NOTE — Progress Notes (Signed)
Summary: OXYCODONE REFILL -  Cannot pick up until 12/16/2009  Phone Note Call from Patient   Caller: Patient Reason for Call: Refill Medication Summary of Call: PT REQUESTING OXYCODONE . PLEASE, CALL HER BACK TO [name] YOU  @ [phone]   Initial call taken by: Cheryll Dessert,  December 13, 2009 12:17 PM  Follow-up for Phone Call        forward to provider Follow-up by: [name],  December 13, 2009 1:53 PM  Additional Follow-up for Phone Call Additional follow up Details #1::        Cannot pick up Rx until Monday. Rx in your basket for Monday pickup. Additional Follow-up by: Tereso Newcomer PA-C,  December 13, 2009 2:06 PM    Additional Follow-up for Phone Call Additional follow up Details #2::    MS [name] CALLED ABOUT HER ARTHROTEC NEEDING REFILL ALSO, AND SHE WANTS TO KNOW WHY DOES SHE HAVE TO CALL IN THIS MEDICINE, WHEN SHE GETS IT EVERY MONTH, AND IF SHE IS NOT GOING TO GET IT EVERY MONTH, DOES SHE NEED TO CALL IT IN AHEAD OF TIME FOR THE REFILL? Follow-up by: Leodis Rains,  December 16, 2009 2:46 PM  Additional Follow-up for Phone Call Additional follow up Details #3:: Details for Additional Follow-up Action Taken: pt picked up rx for oxy....[name] forward to provider... [name]  December 16, 2009 3:42 PM   Prescriptions: PERCOCET 5-325 MG TABS (OXYCODONE-ACETAMINOPHEN) 1-2 tablets by mouth up to every  8 hours as needed for severe pain  #60 x 0   Entered and Authorized by:   Tereso Newcomer PA-C   Signed by:   Tereso Newcomer PA-C on 12/13/2009   Method used:   Print then Give to Patient   RxID:   [id number]

## 2010-09-30 NOTE — Progress Notes (Signed)
Summary: MEDS REFILL-Percocet  Phone Note Refill Request Call back at [phone]   Refills Requested: Medication #1:  PERCOCET 5-325 MG TABS 1-2 tablets by mouth up to every  8 hours as needed for severe pain PATIENT WILL PICK   Initial call taken by: Domenic Polite,  July 30, 2010 4:07 PM  Follow-up for Phone Call        rx printed advise pt to come pick up Follow-up by: Lehman Prom FNP,  July 30, 2010 4:16 PM  Additional Follow-up for Phone Call Additional follow up Details #1::        Left message on answering machine for pt. to return call.  Dutch Quint RN  July 30, 2010 4:27 PM  Pt. notified.  Dutch Quint RN  July 30, 2010 4:36 PM     Prescriptions: PERCOCET 5-325 MG TABS (OXYCODONE-ACETAMINOPHEN) 1-2 tablets by mouth up to every  8 hours as needed for severe pain  #60 x 0   Entered and Authorized by:   Lehman Prom FNP   Signed by:   Lehman Prom FNP on 07/30/2010   Method used:   Print then Give to Patient   RxID:   [id number]

## 2010-09-30 NOTE — Assessment & Plan Note (Signed)
Summary: CPP///KT  Medications Added PREDNISONE 10 MG  TABS (PREDNISONE) Take 4 tabs by mouth x2days then 3 tabs Po x2 days then 2 tabs by mouth x 2 days then 1 tab by mouth x2 days then 1/2 tab by mouth x2 days then off.        Vital Signs:  Patient Profile:   65 Years Old Female Height:     64.5 inches Weight:      291 pounds BMI:     49.36 BSA:     2.31 Temp:     97.2 degrees F oral Pulse rate:   88 / minute Pulse rhythm:   regular Resp:     20 per minute BP sitting:   138 / 90  (left arm) Cuff size:   large  Pt. in pain?   no  Vitals Entered By: Levon Hedger (August 22, 2007 11:24 AM)              Is Patient Diabetic? Yes  CBG Result 140 CBG Device ID B Comments pt did not bring medications today, but states she is taking them.     Chief Complaint:  CPP and thumb nail discoloration issues.  History of Present Illness: #1Here for CPP. #2 Still having problem with sciatic pain occasionally. She would like a short trial of prednisone as this helped in past. #3 Pt considering becoming sexually active with longterm friend. They both are getting checked for STD's before moving forward in their relationship. This will be her first sexual  relationship in 8 years. #4 Pt has abnormally growing nail from when smashed thumb of right hand about 2-3 months ago. Had been painful but no longer. Looks like nail darker in color and pt worried will not grow back normally.   Current Allergies (reviewed today): ! MOTRIN HYDROCHLOROTHIAZIDE  Past Medical History:    Reviewed history from 04/11/2007 and no changes required:       Current Problems:        DM, UNCOMPLICATED, TYPE II (ICD-250.00)       HYPERTENSION, BENIGN ESSENTIAL (ICD-401.1)       ANEMIA DUE TO DIETARY IRON DEFICIENCY (ICD-280.1)       OSTEOARTHRITIS, GENERALIZED (ICD-715.00)         Past Surgical History:    Reviewed history from 04/11/2007 and no changes required:       s/p left TKR 2000       s/p  right shoulder rotator cuff surgery 3/07       h/o flex sig per pt  ~2001.      Physical Exam  General:     Well-developed,well-nourished,in no acute distress; alert,appropriate and cooperative throughout examination Head:     Normocephalic and atraumatic without obvious abnormalities. No apparent alopecia or balding. Eyes:     No corneal or conjunctival inflammation noted. EOMI. Perrla. Ears:     External ear exam shows no significant lesions or deformities.  Otoscopic examination reveals clear canals, tympanic membranes are intact bilaterally without bulging, retraction, inflammation or discharge. Hearing is grossly normal bilaterally. Nose:     External nasal examination shows no deformity or inflammation. Nasal mucosa are pink and moist without lesions or exudates. Mouth:     Oral mucosa and oropharynx without lesions or exudates.  Teeth in good repair. Neck:     No deformities, masses, or tenderness noted.no thyromegaly.   Breasts:     No mass, nodules, thickening, tenderness, bulging, retraction, inflamation, nipple discharge or skin changes noted.  Lungs:     Normal respiratory effort, chest expands symmetrically. Lungs are clear to auscultation, no crackles or wheezes. Heart:     Normal rate and regular rhythm. S1 and S2 normal without gallop, murmur, click, rub or other extra sounds. Abdomen:     Bowel sounds positive,abdomen soft and non-tender without masses, organomegaly or hernias noted. Exam limited by pt obesity Rectal:     No external abnormalities noted. Normal sphincter tone. No rectal masses or tenderness. Guiac negative/control positive. 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 Pulses:     R dorsalis pedis normal and L dorsalis pedis normal.   Extremities:     Right thumb nail: medial aspect of nail deformed due to trauma and is separated from nail bed on that side. No infection. MD d/w pt that she will lose current nail as it grows out and will only know if new nail will return over the next 6 months if one grows out. Neurologic:     alert & oriented X3 and cranial nerves II-XII intact grossly.  Cervical Nodes:     No lymphadenopathy noted Axillary Nodes:     No palpable lymphadenopathy Psych:     Oriented X3, memory intact for recent and remote, normally interactive, and good eye contact.    Diabetes Management Exam:    Foot Exam (with socks and/or shoes not present):       Sensory-Monofilament:          Left foot: normal          Right foot: normal    Impression & Recommendations:  Problem # 1:  EXAMINATION, ROUTINE MEDICAL (ICD-V70.0) needs mammogram Pap/gc/chl done Pt to come in 1/09 for nurse visit for FLP,CMET,CBC,HIV, and RPR  Problem # 2:  DM, UNCOMPLICATED, TYPE II (ICD-250.00) Needs optho exam.MD recommended pt call [facility]. Will also try PAGG referral but d/w pt that in general that [facility] doesn't have specialists available. Her updated medication list for this problem includes:    Metformin Hcl 1000 Mg Tabs (Metformin hcl) .[name]... 1 by mouth bid    Actos 45 Mg Tabs (Pioglitazone hcl) .[name]... 1 poqam    Januvia 100 Mg Tabs (Sitagliptin phosphate) .[name]... 1 by mouth qam    Lisinopril 20 Mg Tabs (Lisinopril) .[name]... 1 by mouth qam  Orders: Ophthalmology Referral (Ophthalmology)   Problem # 3:  SCIATICA, RIGHT (ICD-724.3) On-going problem.Pt requests prednisone burst trial. Her updated medication list for this problem includes:    Arthrotec 75 75-200 Mg-mcg Tabs (Diclofenac-misoprostol) .[name]... 1 by mouth q12  hrs as needed pain    Tramadol Hcl 50 Mg Tabs (Tramadol hcl) .[name]... 1-2 tabs by mouth q6hrs as needed pain    Percocet 5-325 Mg Tabs (Oxycodone-acetaminophen) .[name]... Take 1-2 tablets by mouth every 6-8 hours as needed for back pain Only one script of prednisone and one of percocet given to patient(printed twice).  Complete Medication List: 1)  Albuterol 90 Mcg/act Aers (Albuterol) .... 2 puffs q4-6 hrs prn 2)  Metformin Hcl 1000 Mg Tabs (Metformin hcl) .[name].. 1 by mouth bid 3)  Actos 45 Mg Tabs (Pioglitazone hcl) .[name].. 1 poqam 4)  Januvia 100 Mg Tabs (Sitagliptin phosphate) .[name].. 1 by mouth qam 5)  Pepcid 20 Mg Tabs (Famotidine) .[name].. 1 by mouth bid 6)  Nasacort Aq 55 Mcg/act Aers (Triamcinolone acetonide(nasal)) .[name].. 1 spray qnostril qday 7)  Allegra 180 Mg Tabs (Fexofenadine hcl) .[name].. 1 by mouth qday 8)  Crestor 5 Mg Tabs (Rosuvastatin calcium) .[name].. 1 by mouth qday 9)  Arthrotec 75 75-200 Mg-mcg Tabs (Diclofenac-misoprostol) .[name].. 1 by mouth q12 hrs as needed pain 10)  Tramadol Hcl 50 Mg Tabs (Tramadol hcl) .[name].. 1-2 tabs by mouth q6hrs as needed pain 11)  Lisinopril 20 Mg Tabs (Lisinopril) .[name].. 1 by mouth qam 12)  Percocet 5-325 Mg Tabs (Oxycodone-acetaminophen) .... Take 1-2 tablets by mouth every 6-8 hours as needed for back pain 13)  Promethazine Hcl 25 Mg Tabs (Promethazine hcl) .... Take 1 tablet by mouth every 8 hours as needed nausea/vomiting 14)  Prednisone 10 Mg Tabs (Prednisone) .... Take 4 tabs by mouth x2days then 3 tabs po x2 days then 2 tabs by mouth x 2 days then 1 tab by mouth x2 days then 1/2 tab by mouth x2 days then off.  Other Orders: Capillary Blood Glucose (13086) Fingerstick (57846) Thin Prep Pap (96295) Pap Smear, Thin Prep ( Collection of) (M8413) Mammogram (Screening) (Mammo)   Patient Instructions: 1)  F/U 2nd week in January for fasting labwork.(nurse visit).    Prescriptions: PREDNISONE 10 MG  TABS (PREDNISONE) Take 4 tabs by mouth x2days then 3 tabs Po x2  days then 2 tabs by mouth x 2 days then 1 tab by mouth x2 days then 1/2 tab by mouth x2 days then off.  #22 x 0   Entered and Authorized by:   Beverley Fiedler MD   Signed by:   Beverley Fiedler MD on 08/22/2007   Method used:   Print then Give to Patient   RxID:   [id number] PERCOCET 5-325 MG  TABS (OXYCODONE-ACETAMINOPHEN) Take 1-2 tablets by mouth every 6-8 hours as needed for back pain  #60 x 0   Entered and Authorized by:   Beverley Fiedler MD   Signed by:   Beverley Fiedler MD on 08/22/2007   Method used:   Print then Give to Patient   RxID:   [id number] PERCOCET 5-325 MG  TABS (OXYCODONE-ACETAMINOPHEN) Take 1-2 tablets by mouth every 6-8 hours as needed for back pain  #60 x 0   Entered and Authorized by:   Beverley Fiedler MD   Signed by:   Beverley Fiedler MD on 08/22/2007   Method used:   Print then Give to Patient   RxID:   [id number] PREDNISONE 10 MG  TABS (PREDNISONE) Take 4 tabs by mouth x2days then 3 tabs Po x2 days then 2 tabs by mouth x 2 days then 1 tab by mouth x2 days then 1/2 tab by mouth x2 days then off.  #22 x 0   Entered and Authorized by:   Beverley Fiedler MD   Signed by:   Beverley Fiedler MD on 08/22/2007   Method used:   Print then Give to Patient   RxID:   [id number]  ]        Diabetic Foot Exam    10-g (5.07) Semmes-Weinstein Monofilament Test Performed by: Steward Drone  Craddock          Right Foot          Left Foot Visual Inspection               Test Control      normal         normal Site 1         normal         normal Site 2         normal         normal Site 3         normal         normal Site 4         normal         normal Site 5         normal         normal Site 6         normal         normal Site 7         normal         normal Site 8         normal         normal Site 9         normal         normal Site 10         normal         normal  Impression      normal         normal  Laboratory Results   Urine  Tests  Date/Time Received: August 22, 2007 11:49 AM  Date/Time Reported: August 22, 2007 11:49 AM   Routine Urinalysis   Color: yellow Appearance: Cloudy Glucose: 100   (Normal Range: Negative) Bilirubin: small   (Normal Range: Negative) Ketone: trace (5)   (Normal Range: Negative) Spec. Gravity: >=1.030   (Normal Range: 1.003-1.035) Blood: negative   (Normal Range: Negative) pH: 5.0   (Normal Range: 5.0-8.0) Protein: 30   (Normal Range: Negative) Urobilinogen: 0.2   (Normal Range: 0-1) Nitrite: negative   (Normal Range: Negative) Leukocyte Esterace: negative   (Normal Range: Negative)     Blood Tests     CBG Random:: 140

## 2010-10-02 NOTE — Progress Notes (Signed)
Summary: RX REFILL  Phone Note Refill Request Call back at [phone]   Refills Requested: Medication #1:  PERCOCET 5-325 MG TABS 1-2 tablets by mouth up to every  8 hours as needed for severe pain PLEASE, CALL PT [phone]  OR [phone] TO PICK UP THE RX THANK YOU  Initial call taken by: Cheryll Dessert,  September 02, 2010 10:40 AM  Follow-up for Phone Call        [phone] PLEASE CALL PT Follow-up by: Domenic Polite,  September 03, 2010 10:03 AM  Additional Follow-up for Phone Call Additional follow up Details #1::        I will bring in later today to print Additional Follow-up by: Julieanne Manson MD,  September 04, 2010 9:19 AM    Additional Follow-up for Phone Call Additional follow up Details #2::    ADVISED PT THAT PROVIDER HAS COMPLETED BUT WILL NEED TO PRINT OUT. PT IS AWARE AND WE WILL CONTACT HER WHEN PROVIDER COMES IN AND PRINTS OUT RX.Marland KitchenHassell Halim CMA  September 04, 2010 10:14 AM   Additional Follow-up for Phone Call Additional follow up Details #3:: Details for Additional Follow-up Action Taken: patient is aware to come and pick up rx today. Additional Follow-up by: Leodis Rains,  September 04, 2010 4:29 PM  Prescriptions: PERCOCET 5-325 MG TABS (OXYCODONE-ACETAMINOPHEN) 1-2 tablets by mouth up to every  8 hours as needed for severe pain  #60 x 0   Entered and Authorized by:   Julieanne Manson MD   Signed by:   Julieanne Manson MD on 09/04/2010   Method used:   Print then Give to Patient   RxID:   [id number]

## 2010-10-02 NOTE — Letter (Signed)
Summary: PODIATRY  PODIATRY   Imported By: Arta Bruce 08/12/2010 14:42:11  _____________________________________________________________________  External Attachment:    Type:   Image     Comment:   External Document

## 2010-10-08 NOTE — Progress Notes (Signed)
Summary: PERCOCET  Phone Note Call from Patient Call back at Home Phone [phone]   Reason for Call: Refill Medication Summary of Call: [facility] PT. [facility] IS CALLING FOR HER PERCOCET THAT IS DUE ON THE 3rd.  Initial call taken by: Leodis Rains,  September 29, 2010 10:35 AM  Follow-up for Phone Call        Last filled 09/04/10, last seen 07/01/10 Follow-up by: Gaylyn Cheers RN,  September 29, 2010 11:30 AM  Additional Follow-up for Phone Call Additional follow up Details #1::        Fill on Friday--she can pick up then Julieanne Manson MD  October 01, 2010 5:46 AM     Prescriptions: PERCOCET 5-325 MG TABS (OXYCODONE-ACETAMINOPHEN) 1-2 tablets by mouth up to every  8 hours as needed for severe pain  #60 x 0   Entered and Authorized by:   Julieanne Manson MD   Signed by:   Julieanne Manson MD on 10/02/2010   Method used:   Print then Give to Patient   RxID:   [id number]

## 2010-10-29 ENCOUNTER — Telehealth (INDEPENDENT_AMBULATORY_CARE_PROVIDER_SITE_OTHER): Payer: Self-pay | Admitting: *Deleted

## 2010-11-06 NOTE — Progress Notes (Signed)
Summary: Percocet Refill  Phone Note Refill Request Call back at [phone]   Refills Requested: Medication #1:  PERCOCET 5-325 MG TABS 1-2 tablets by mouth up to every  8 hours as needed for severe pain Please call when she can pick up Rx.    Initial call taken by: Dutch Quint RN,  October 29, 2010 12:01 PM  Follow-up for Phone Call        May pick up tomorrow Follow-up by: Julieanne Manson MD,  October 29, 2010 3:57 PM  Additional Follow-up for Phone Call Additional follow up Details #1::        Left message on answering machine for pt. to return call.  Dutch Quint RN  October 30, 2010 8:39 AM     Additional Follow-up for Phone Call Additional follow up Details #2::    CALLD PT Follow-up by: Arta Bruce,  October 30, 2010 9:02 AM  Prescriptions: PERCOCET 5-325 MG TABS (OXYCODONE-ACETAMINOPHEN) 1-2 tablets by mouth up to every  8 hours as needed for severe pain  #60 x 0   Entered and Authorized by:   Julieanne Manson MD   Signed by:   Julieanne Manson MD on 10/29/2010   Method used:   Print then Give to Patient   RxID:   [id number]

## 2010-11-13 LAB — GLUCOSE, CAPILLARY: Glucose-Capillary: 142 mg/dL — ABNORMAL HIGH (ref 70–99)

## 2011-01-16 NOTE — Op Note (Signed)
NAMESAVANNAHA, [name]             ACCOUNT NO.:  [id number]   MEDICAL RECORD NO.:  [id number]          PATIENT TYPE:  OIB   LOCATION:  2550                         FACILITY:  [facility]   PHYSICIAN:  Dyke Brackett, M.D.    DATE OF BIRTH:  July 06, 1946   DATE OF PROCEDURE:  10/07/2005  DATE OF DISCHARGE:                                 OPERATIVE REPORT   PREOPERATIVE DIAGNOSIS:  1.  Severe rotator cuff tear.  2.  Impingement.  3.  Acromioclavicular joint arthritis.  4.  Labral tear.   POSTOPERATIVE DIAGNOSIS:  1.  Severe rotator cuff tear.  2.  Impingement.  3.  Acromioclavicular joint arthritis.  4.  Labral tear.   OPERATION:  1.  Open rotator cuff repair with acromioplasty with arthroscopic      debridement and excision distal clavicle.   SURGEON:  Dyke Brackett, M.D.   ASSISTANT:  Arlys John D. Petrarca, P.A.-C.   DESCRIPTION OF PROCEDURE:  Arthroscopy was accomplished in the beach chair  position with a posterior and anterior portal revealing a very large tough  tear with a complex pattern, specifically a V-shaped component with a  transverse component, as well.  Significant retraction.  Reasonable  maintenance of tissue thickness relative to the infraspinatus and  supraspinatus was mildly attenuated.  Intra-articularly, the glenohumeral  joint showed no significant degenerative change.  There was a nubbing or  remnant of biceps that may have been impinging in the joint, it  was  debrided.  Due to the large nature of the care, the procedure was next  converted to an open procedure.  An incision was made dissecting the [facility]  joint, acromial area, distal 1 cm to 1.5 cm was taken out of a very  degenerative hypertrophied clavicle and, likewise, the same on the acromion.  Bursectomy was carried out revealing the cuff tear, the cuff edges were  mobilized, particularly on the extra-articular side with release of the  coracohumeral ligament.  The edges of the degenerative cuff, which were  very  degenerative with under surface degeneration, as well.  Depression with a 15  blade.  #2 FiberWire was used to oversew the longitudinal split converting  this mainly to a transverse tear and then two Arthrex 5/5 anchors, each with  number 2 FiberWire's were placed, one anchor with two sutures placed into  the anterior leaflet and the second anchor with two sutures on the posterior  leaflet, joining the two leaflets somewhat together and, again, the most  intervening more medial portion had been oversewn with a running 2-0  FiberWire.  One or two of the sutures on each side of the repair were tied  together to create an essentially watertight repair, although it should be  noted that the tissue, particularly on the anterior leaflet, was moderately  attenuated.  Prior to insertion of these anchors, the bony surface of the  greater tuberosity was burred with the high speed bur back to bleeding bone.  The deltoid, which had been taken off to about 2-3 cm distal to the tip of  the acromion was meticulously reapproximated with multiple  interrupted  Ethibond with 2-0 Vicryl in the subcutaneous tissues.  A lightly compressive  sterile dressing was applied with a mini-pillar splint.  Taken to the  recovery room in stable condition.      Dyke Brackett, M.D.  Electronically Signed     WDC/MEDQ  D:  10/07/2005  T:  10/07/2005  Job:  [id number]

## 2011-01-23 ENCOUNTER — Other Ambulatory Visit (HOSPITAL_COMMUNITY): Payer: Self-pay | Admitting: Family Medicine

## 2011-01-23 DIAGNOSIS — Z1231 Encounter for screening mammogram for malignant neoplasm of breast: Secondary | ICD-10-CM

## 2011-02-06 ENCOUNTER — Ambulatory Visit (HOSPITAL_COMMUNITY)
Admission: RE | Admit: 2011-02-06 | Discharge: 2011-02-06 | Disposition: A | Discharge status: Home / Self Care | Payer: Self-pay | Source: Ambulatory Visit | Attending: Family Medicine | Admitting: Family Medicine

## 2011-02-06 DIAGNOSIS — Z1231 Encounter for screening mammogram for malignant neoplasm of breast: Secondary | ICD-10-CM | POA: Insufficient documentation

## 2011-02-11 ENCOUNTER — Other Ambulatory Visit: Payer: Self-pay | Admitting: Family Medicine

## 2011-02-11 DIAGNOSIS — R928 Other abnormal and inconclusive findings on diagnostic imaging of breast: Secondary | ICD-10-CM

## 2011-02-17 ENCOUNTER — Ambulatory Visit
Admission: RE | Admit: 2011-02-17 | Discharge: 2011-02-17 | Disposition: A | Discharge status: Home / Self Care | Payer: Self-pay | Source: Ambulatory Visit | Attending: Family Medicine | Admitting: Family Medicine

## 2011-02-17 DIAGNOSIS — R928 Other abnormal and inconclusive findings on diagnostic imaging of breast: Secondary | ICD-10-CM

## 2011-04-02 ENCOUNTER — Ambulatory Visit (HOSPITAL_COMMUNITY)
Admission: RE | Admit: 2011-04-02 | Discharge: 2011-04-02 | Disposition: A | Discharge status: Home / Self Care | Payer: PRIVATE HEALTH INSURANCE | Source: Ambulatory Visit | Attending: Orthopedic Surgery | Admitting: Orthopedic Surgery

## 2011-04-02 ENCOUNTER — Other Ambulatory Visit: Payer: Self-pay | Admitting: Orthopedic Surgery

## 2011-04-02 ENCOUNTER — Encounter (HOSPITAL_COMMUNITY): Payer: PRIVATE HEALTH INSURANCE

## 2011-04-02 ENCOUNTER — Other Ambulatory Visit (HOSPITAL_COMMUNITY): Payer: Self-pay | Admitting: Orthopedic Surgery

## 2011-04-02 DIAGNOSIS — E119 Type 2 diabetes mellitus without complications: Secondary | ICD-10-CM | POA: Insufficient documentation

## 2011-04-02 DIAGNOSIS — Z01810 Encounter for preprocedural cardiovascular examination: Secondary | ICD-10-CM | POA: Insufficient documentation

## 2011-04-02 DIAGNOSIS — Z01818 Encounter for other preprocedural examination: Secondary | ICD-10-CM

## 2011-04-02 DIAGNOSIS — M1710 Unilateral primary osteoarthritis, unspecified knee: Secondary | ICD-10-CM | POA: Insufficient documentation

## 2011-04-02 DIAGNOSIS — R9431 Abnormal electrocardiogram [ECG] [EKG]: Secondary | ICD-10-CM | POA: Insufficient documentation

## 2011-04-02 DIAGNOSIS — Z01812 Encounter for preprocedural laboratory examination: Secondary | ICD-10-CM | POA: Insufficient documentation

## 2011-04-02 DIAGNOSIS — I10 Essential (primary) hypertension: Secondary | ICD-10-CM | POA: Insufficient documentation

## 2011-04-02 LAB — SURGICAL PCR SCREEN
MRSA, PCR: NEGATIVE
Staphylococcus aureus: NEGATIVE

## 2011-04-02 LAB — URINALYSIS, ROUTINE W REFLEX MICROSCOPIC
Bilirubin Urine: NEGATIVE
Glucose, UA: >1000 mg/dL — AB
Hgb urine dipstick: NEGATIVE
Leukocytes, UA: NEGATIVE
Nitrite: NEGATIVE
Protein, ur: NEGATIVE mg/dL
Specific Gravity, Urine: 1.037 — ABNORMAL HIGH (ref 1.005–1.030)
Urobilinogen, UA: 0.2 mg/dL (ref 0.0–1.0)
pH: 5.5 (ref 5.0–8.0)

## 2011-04-02 LAB — URINE MICROSCOPIC-ADD ON

## 2011-04-02 LAB — CBC
HCT: 39.1 % (ref 36.0–46.0)
Hemoglobin: 12 g/dL (ref 12.0–15.0)
MCH: 25.1 pg — ABNORMAL LOW (ref 26.0–34.0)
MCHC: 30.7 g/dL (ref 30.0–36.0)
MCV: 81.6 fL (ref 78.0–100.0)
Platelets: 246 10*3/uL (ref 150–400)
RBC: 4.79 MIL/uL (ref 3.87–5.11)
RDW: 14.8 % (ref 11.5–15.5)
WBC: 7.7 10*3/uL (ref 4.0–10.5)

## 2011-04-02 LAB — COMPREHENSIVE METABOLIC PANEL
ALT: 28 U/L (ref 0–35)
AST: 18 U/L (ref 0–37)
Albumin: 3.8 g/dL (ref 3.5–5.2)
Alkaline Phosphatase: 108 U/L (ref 39–117)
BUN: 15 mg/dL (ref 6–23)
CO2: 28 mEq/L (ref 19–32)
Calcium: 10 mg/dL (ref 8.4–10.5)
Chloride: 104 mEq/L (ref 96–112)
Creatinine, Ser: 0.69 mg/dL (ref 0.50–1.10)
GFR calc Af Amer: >60 mL/min (ref >60)
GFR calc non Af Amer: >60 mL/min (ref >60)
Glucose, Bld: 222 mg/dL — ABNORMAL HIGH (ref 70–99)
Potassium: 4.3 mEq/L (ref 3.5–5.1)
Sodium: 141 mEq/L (ref 135–145)
Total Bilirubin: 0.2 mg/dL — ABNORMAL LOW (ref 0.3–1.2)
Total Protein: 8.5 g/dL — ABNORMAL HIGH (ref 6.0–8.3)

## 2011-04-02 LAB — PROTIME-INR
INR: 1.06 (ref 0.00–1.49)
Prothrombin Time: 14 seconds (ref 11.6–15.2)

## 2011-04-02 LAB — APTT: aPTT: 24 seconds (ref 24–37)

## 2011-04-13 ENCOUNTER — Inpatient Hospital Stay (HOSPITAL_COMMUNITY)
Admission: RE | Admit: 2011-04-13 | Discharge: 2011-04-20 | DRG: 470 | Disposition: A | Discharge status: Home Health | Payer: PRIVATE HEALTH INSURANCE | Source: Ambulatory Visit | Attending: Orthopedic Surgery | Admitting: Orthopedic Surgery

## 2011-04-13 DIAGNOSIS — M1710 Unilateral primary osteoarthritis, unspecified knee: Principal | ICD-10-CM | POA: Diagnosis present

## 2011-04-13 DIAGNOSIS — E875 Hyperkalemia: Secondary | ICD-10-CM | POA: Diagnosis not present

## 2011-04-13 DIAGNOSIS — K219 Gastro-esophageal reflux disease without esophagitis: Secondary | ICD-10-CM | POA: Diagnosis present

## 2011-04-13 DIAGNOSIS — I10 Essential (primary) hypertension: Secondary | ICD-10-CM | POA: Diagnosis present

## 2011-04-13 DIAGNOSIS — R011 Cardiac murmur, unspecified: Secondary | ICD-10-CM | POA: Diagnosis present

## 2011-04-13 DIAGNOSIS — R509 Fever, unspecified: Secondary | ICD-10-CM | POA: Diagnosis not present

## 2011-04-13 DIAGNOSIS — E119 Type 2 diabetes mellitus without complications: Secondary | ICD-10-CM | POA: Diagnosis present

## 2011-04-13 LAB — ABO/RH: ABO/RH(D): AB POS

## 2011-04-13 LAB — GLUCOSE, CAPILLARY
Glucose-Capillary: 229 mg/dL — ABNORMAL HIGH (ref 70–99)
Glucose-Capillary: 208 mg/dL — ABNORMAL HIGH (ref 70–99)
Glucose-Capillary: 192 mg/dL — ABNORMAL HIGH (ref 70–99)
Glucose-Capillary: 198 mg/dL — ABNORMAL HIGH (ref 70–99)
Glucose-Capillary: 249 mg/dL — ABNORMAL HIGH (ref 70–99)

## 2011-04-13 LAB — TYPE AND SCREEN
ABO/RH(D): AB POS
Antibody Screen: NEGATIVE

## 2011-04-14 LAB — BASIC METABOLIC PANEL
BUN: 15 mg/dL (ref 6–23)
BUN: 13 mg/dL (ref 6–23)
CO2: 25 mEq/L (ref 19–32)
CO2: 19 mEq/L (ref 19–32)
Calcium: 8.9 mg/dL (ref 8.4–10.5)
Calcium: 8.9 mg/dL (ref 8.4–10.5)
Chloride: 101 mEq/L (ref 96–112)
Chloride: 101 mEq/L (ref 96–112)
Creatinine, Ser: 0.67 mg/dL (ref 0.50–1.10)
Creatinine, Ser: 0.59 mg/dL (ref 0.50–1.10)
GFR calc Af Amer: >60 mL/min (ref >60)
GFR calc Af Amer: >60 mL/min (ref >60)
GFR calc non Af Amer: >60 mL/min (ref >60)
GFR calc non Af Amer: >60 mL/min (ref >60)
Glucose, Bld: 268 mg/dL — ABNORMAL HIGH (ref 70–99)
Glucose, Bld: 277 mg/dL — ABNORMAL HIGH (ref 70–99)
Potassium: 5.2 mEq/L — ABNORMAL HIGH (ref 3.5–5.1)
Potassium: 4.8 mEq/L (ref 3.5–5.1)
Sodium: 134 mEq/L — ABNORMAL LOW (ref 135–145)
Sodium: 132 mEq/L — ABNORMAL LOW (ref 135–145)

## 2011-04-14 LAB — CBC
HCT: 30.8 % — ABNORMAL LOW (ref 36.0–46.0)
Hemoglobin: 10 g/dL — ABNORMAL LOW (ref 12.0–15.0)
MCH: 26.3 pg (ref 26.0–34.0)
MCHC: 32.5 g/dL (ref 30.0–36.0)
MCV: 81.1 fL (ref 78.0–100.0)
Platelets: 256 10*3/uL (ref 150–400)
RBC: 3.8 MIL/uL — ABNORMAL LOW (ref 3.87–5.11)
RDW: 14.9 % (ref 11.5–15.5)
WBC: 8.4 10*3/uL (ref 4.0–10.5)

## 2011-04-14 LAB — GLUCOSE, CAPILLARY
Glucose-Capillary: 223 mg/dL — ABNORMAL HIGH (ref 70–99)
Glucose-Capillary: 242 mg/dL — ABNORMAL HIGH (ref 70–99)
Glucose-Capillary: 263 mg/dL — ABNORMAL HIGH (ref 70–99)
Glucose-Capillary: 316 mg/dL — ABNORMAL HIGH (ref 70–99)
Glucose-Capillary: 227 mg/dL — ABNORMAL HIGH (ref 70–99)

## 2011-04-15 ENCOUNTER — Inpatient Hospital Stay (HOSPITAL_COMMUNITY): Payer: PRIVATE HEALTH INSURANCE

## 2011-04-15 LAB — BASIC METABOLIC PANEL
BUN: 7 mg/dL (ref 6–23)
CO2: 24 mEq/L (ref 19–32)
Calcium: 9.2 mg/dL (ref 8.4–10.5)
Chloride: 100 mEq/L (ref 96–112)
Creatinine, Ser: 0.51 mg/dL (ref 0.50–1.10)
GFR calc Af Amer: >60 mL/min (ref >60)
GFR calc non Af Amer: >60 mL/min (ref >60)
Glucose, Bld: 188 mg/dL — ABNORMAL HIGH (ref 70–99)
Potassium: 4 mEq/L (ref 3.5–5.1)
Sodium: 133 mEq/L — ABNORMAL LOW (ref 135–145)

## 2011-04-15 LAB — URINALYSIS, ROUTINE W REFLEX MICROSCOPIC
Bilirubin Urine: NEGATIVE
Glucose, UA: >1000 mg/dL — AB
Ketones, ur: 40 mg/dL — AB
Nitrite: NEGATIVE
Protein, ur: NEGATIVE mg/dL
Specific Gravity, Urine: 1.018 (ref 1.005–1.030)
Urobilinogen, UA: 0.2 mg/dL (ref 0.0–1.0)
pH: 7 (ref 5.0–8.0)

## 2011-04-15 LAB — GLUCOSE, CAPILLARY
Glucose-Capillary: 169 mg/dL — ABNORMAL HIGH (ref 70–99)
Glucose-Capillary: 223 mg/dL — ABNORMAL HIGH (ref 70–99)
Glucose-Capillary: 200 mg/dL — ABNORMAL HIGH (ref 70–99)

## 2011-04-15 LAB — CBC
HCT: 27.3 % — ABNORMAL LOW (ref 36.0–46.0)
Hemoglobin: 8.8 g/dL — ABNORMAL LOW (ref 12.0–15.0)
MCH: 26 pg (ref 26.0–34.0)
MCHC: 32.2 g/dL (ref 30.0–36.0)
MCV: 80.5 fL (ref 78.0–100.0)
Platelets: 231 10*3/uL (ref 150–400)
RBC: 3.39 MIL/uL — ABNORMAL LOW (ref 3.87–5.11)
RDW: 14.3 % (ref 11.5–15.5)
WBC: 11.5 10*3/uL — ABNORMAL HIGH (ref 4.0–10.5)

## 2011-04-15 LAB — URINE MICROSCOPIC-ADD ON

## 2011-04-15 NOTE — H&P (Signed)
  Katelyn [name], Katelyn [name]             ACCOUNT NO.:  [id number]  MEDICAL RECORD NO.:  [id number]  LOCATION:                                 FACILITY:  PHYSICIAN:  Ollen Gross, M.D.    DATE OF BIRTH:  1946/04/06  DATE OF ADMISSION:  04/13/2011 DATE OF DISCHARGE:                             HISTORY & PHYSICAL   CHIEF COMPLAINT:  Right knee pain.  HISTORY OF PRESENT ILLNESS:  The patient is a 65 year old female who has seen by Dr. Lequita Halt for right knee pain.  She has known end-stage arthritis that is progressively gotten worse to the point where she would like have something permanently done.  Risks and benefits have been discussed.  She elected to proceed with surgery.  ALLERGIES:  No known drug allergies.  Intolerances:  She is sensitive to many ANTI-INFLAMMATORIES.  CURRENT MEDICATIONS:  Arthrotec, baby aspirin, Zyrtec, fish oil, and vitamin B12.  PAST MEDICAL HISTORY: 1. Seasonal allergies. 2. Hypertension. 3. Heart murmur. 4. Reflux disease. 5. Urinary incontinence. 6. Non-insulin dependent diabetes mellitus. 7. Osteoarthritis. 8. Postmenopausal.  PAST SURGICAL HISTORY:  She had a left total knee replacement, childhood tonsillectomy, and a right shoulder surgery x2.  FAMILY HISTORY:  Father deceased at age 60 of prostate cancer.  Mother deceased in her 11s of MI and diabetes.  SOCIAL HISTORY:  Nonsmoker.  No alcohol.  She does have a caregiver lined up.  She has four steps entering her that she will have a ramp also.  REVIEW OF SYSTEMS:  GENERAL:  No fevers or chills, occasional night sweats.  NEURO:  No seizure, syncope, or paralysis.  RESPIRATORY:  No shortness breath, productive cough, or hemoptysis.  CARDIOVASCULAR:  No chest pain or orthopnea.  GI:  No nausea, vomiting, diarrhea, or constipation.  GU: No dysuria, hematuria, or discharge. MUSCULOSKELETAL:  Knee pain.  PHYSICAL EXAM:  VITAL SIGNS:  Pulse 96, respirations 14, and blood pressure  158/70. GENERAL: A 65 year old African American female well-nourished, well- developed, overweight, obese, alert, oriented, and cooperative, pleasant, excellent historian. HEENT:  Normocephalic and atraumatic.  Pupils are round and reactive. EOMs intact.  Wear reading glasses. NECK:  Supple. CHEST:  Clear. HEART:  Regular rate and rhythm without murmur.  S1 and S2 noted. ABDOMEN: Soft, nontender, bowel sounds present. RECTAL, BREASTS AND GENITALIA:  Not done and not part of present illness. EXTREMITIES:  Right knee, no effusion, varus malalignment deformity, tender more medial than lateral, range of motion 10 to 120.  IMPRESSION:  Osteoarthritis of the right knee.  PLAN:  The patient will be admitted to [facility] to undergo right total knee replacement and arthroplasty.  Surgery will be performed by Dr. Ollen Gross.  Her initial plan is to go home after her surgery.     Alexzandrew L. Julien Girt, P.A.C.   ______________________________ Ollen Gross, M.D.    ALP/MEDQ  D:  04/12/2011  T:  04/13/2011  Job:  [id number] cc:   Tresa Endo L. Philipp Deputy, M.D. Fax: [phone]  Electronically Signed by Patrica Duel P.A.C. on 04/14/2011 10:12:08 AM Electronically Signed by Ollen Gross M.D. on 04/15/2011 10:28:00 AM

## 2011-04-15 NOTE — Op Note (Signed)
Katelyn [name], Katelyn [name]             ACCOUNT NO.:  [id number]  MEDICAL RECORD NO.:  [id number]  LOCATION:  1609                         FACILITY:  [facility]  PHYSICIAN:  Ollen Gross, M.D.    DATE OF BIRTH:  1946/01/10  DATE OF PROCEDURE:  04/13/2011 DATE OF DISCHARGE:                              OPERATIVE REPORT   PREOPERATIVE DIAGNOSIS:  Osteoarthritis, right knee.  POSTOPERATIVE DIAGNOSIS:  Osteoarthritis, right knee.  PROCEDURE:  Right total knee arthroplasty.  SURGEON:  Ollen Gross, MD  ASSISTANT:  Alexzandrew L. Julien Girt, PA-C  ANESTHESIA:  General.  ESTIMATED BLOOD LOSS:  200.  DRAINS:  Hemovac x1.  TOURNIQUET TIME:  3 minutes and then tourniquet failed.  COMPLICATIONS:  None.  CONDITION:  Stable to Recovery.  BRIEF CLINICAL NOTE:  Katelyn [name] is a 65 year old female with end-stage arthritis of the right knee with progressively worsening pain and dysfunction.  She has tricompartmental bone-on-bone changes with varus deformity and large osteophytes.  She has failed nonoperative management including analgesics and injections.  She has had a successful left total knee arthroplasty and presents now for right total knee arthroplasty.  There were no obvious contraindications to this procedure.  PROCEDURE IN DETAIL:  After successful administration of general anesthetic, a tourniquet was placed high on her right thigh, and her right lower extremity was prepped and draped in the usual sterile fashion.  Extremity was wrapped in an Esmarch, knee flexed, tourniquet inflated to 300 mmHg.  Midline incision was made with 10 blade through subcutaneous tissue to the level of the extensor mechanism.  A fresh blade was used to make a medial parapatellar arthrotomy.  At this point, it was obvious that the tourniquet was acting as a venous tourniquet and then deflated.  Once deflated, the bleeding essentially stopped.  Medial arthrotomy was then completed and then the soft tissue of the  proximal medial tibia subperiosteally elevated to the joint line with the knife into the semimembranosus bursa with a Cobb elevator.  Soft tissue laterally was elevated with attention being paid to avoid any patellar tendon on tibial tubercle.  Patella was everted and ACL and PCL removed. Drill was used to create a starting hole in the distal femur and the canal was thoroughly irrigated.  The 5-degree right valgus alignment guide was placed.  Distal femoral cutting block was pinned to remove 10 mm off the distal femur.  Resection was made with an oscillating saw. Sizing guide was placed, size 4 narrow was most appropriate.  Rotation was marked off the epicondylar axis.  Size 4 cutting block was placed and the anterior and posterior chamfer cuts are made.  The tibia subluxed forward and the menisci were removed.  Extramedullary tibial alignment guide was placed referencing proximally at the medial aspect of tibial tubercle and distally along the 2nd metatarsal axis and tibial crest.  The block was pinned to remove 2 mm off the more deficient medial side.  Tibial resection was made with an oscillating saw.  Size 3 was the most appropriate tibial component and proximal tibia was prepared with the modular drill and keel punched for the size 3.  Femoral preparation was completed with the intercondylar cut.  The size 3  mobile-bearing tibial trial, size 4 narrow posterior stabilized femoral trial, and a 10 mm posterior stabilized rotating platform insert trial were placed.  With the 10, there was a tiny bit of varus-valgus play, so I went to a 12.5 which allowed for full extension with excellent varus-valgus, anterior-posterior balance, throughout full range of motion.  Patella was everted, thickness measured to be 22 mm. Freehand resection was taken to 12 mm, 38 template was placed, lug holes were drilled, trial patella was placed and it tracked normally. Osteophytes removed off the posterior  femur with the trial in place. All trials were removed and the cut bone surfaces were prepared with pulsatile lavage.  Cement was mixed and once ready for implantation, the size 3 mobile-bearing tibial tray, size 4 narrow posterior stabilized femur, and 38 patella were cemented in place.  Patella was held a clamp. Trial 12.5 mm insert was placed, knee held in full extension, and all extruded cement removed.  When the cement was fully hardened, then the permanent 12.5 mm posterior stabilized rotating platform insert was placed in the tibial tray.  Wound was again irrigated and the arthrotomy closed over Hemovac drain with interrupted #1 PDS.  Flexion against gravity was 125 degrees.  Patella tracked normally.  Subcutaneous was closed with interrupted 2-0 Vicryl and subcuticular with running 4-0 Monocryl.  The incision was then cleaned and dried and the catheter for the Marcaine pain pump was placed and the pump was initiated.  The Steri- Strips and a bulky sterile dressing were then applied and she was placed into a knee immobilizer, awakened, and transported to Recovery in stable condition.     Ollen Gross, M.D.     FA/MEDQ  D:  04/13/2011  T:  04/14/2011  Job:  [id number]  Electronically Signed by Ollen Gross M.D. on 04/15/2011 10:28:02 AM

## 2011-04-16 LAB — BASIC METABOLIC PANEL
BUN: 8 mg/dL (ref 6–23)
CO2: 23 mEq/L (ref 19–32)
Calcium: 9.4 mg/dL (ref 8.4–10.5)
Chloride: 98 mEq/L (ref 96–112)
Creatinine, Ser: 0.56 mg/dL (ref 0.50–1.10)
GFR calc Af Amer: >60 mL/min (ref >60)
GFR calc non Af Amer: >60 mL/min (ref >60)
Glucose, Bld: 192 mg/dL — ABNORMAL HIGH (ref 70–99)
Potassium: 3.7 mEq/L (ref 3.5–5.1)
Sodium: 132 mEq/L — ABNORMAL LOW (ref 135–145)

## 2011-04-16 LAB — GLUCOSE, CAPILLARY
Glucose-Capillary: 187 mg/dL — ABNORMAL HIGH (ref 70–99)
Glucose-Capillary: 178 mg/dL — ABNORMAL HIGH (ref 70–99)
Glucose-Capillary: 257 mg/dL — ABNORMAL HIGH (ref 70–99)
Glucose-Capillary: 254 mg/dL — ABNORMAL HIGH (ref 70–99)
Glucose-Capillary: 229 mg/dL — ABNORMAL HIGH (ref 70–99)

## 2011-04-16 LAB — URINE CULTURE
Colony Count: 50000
Culture  Setup Time: 201208151426
Special Requests: NEGATIVE

## 2011-04-16 LAB — CBC
HCT: 26.9 % — ABNORMAL LOW (ref 36.0–46.0)
Hemoglobin: 8.8 g/dL — ABNORMAL LOW (ref 12.0–15.0)
MCH: 26 pg (ref 26.0–34.0)
MCHC: 32.7 g/dL (ref 30.0–36.0)
MCV: 79.6 fL (ref 78.0–100.0)
Platelets: 232 10*3/uL (ref 150–400)
RBC: 3.38 MIL/uL — ABNORMAL LOW (ref 3.87–5.11)
RDW: 14.4 % (ref 11.5–15.5)
WBC: 12.3 10*3/uL — ABNORMAL HIGH (ref 4.0–10.5)

## 2011-04-17 LAB — GLUCOSE, CAPILLARY
Glucose-Capillary: 205 mg/dL — ABNORMAL HIGH (ref 70–99)
Glucose-Capillary: 305 mg/dL — ABNORMAL HIGH (ref 70–99)

## 2011-04-18 LAB — GLUCOSE, CAPILLARY
Glucose-Capillary: 207 mg/dL — ABNORMAL HIGH (ref 70–99)
Glucose-Capillary: 209 mg/dL — ABNORMAL HIGH (ref 70–99)
Glucose-Capillary: 161 mg/dL — ABNORMAL HIGH (ref 70–99)
Glucose-Capillary: 231 mg/dL — ABNORMAL HIGH (ref 70–99)
Glucose-Capillary: 200 mg/dL — ABNORMAL HIGH (ref 70–99)
Glucose-Capillary: 279 mg/dL — ABNORMAL HIGH (ref 70–99)

## 2011-04-19 LAB — GLUCOSE, CAPILLARY
Glucose-Capillary: 203 mg/dL — ABNORMAL HIGH (ref 70–99)
Glucose-Capillary: 245 mg/dL — ABNORMAL HIGH (ref 70–99)
Glucose-Capillary: 292 mg/dL — ABNORMAL HIGH (ref 70–99)
Glucose-Capillary: 141 mg/dL — ABNORMAL HIGH (ref 70–99)

## 2011-04-20 LAB — GLUCOSE, CAPILLARY
Glucose-Capillary: 143 mg/dL — ABNORMAL HIGH (ref 70–99)
Glucose-Capillary: 282 mg/dL — ABNORMAL HIGH (ref 70–99)

## 2011-04-20 NOTE — Discharge Summary (Signed)
  NAMEPORCHIA, Katelyn [name]             ACCOUNT NO.:  [id number]  MEDICAL RECORD NO.:  [id number]  LOCATION:  1609                         FACILITY:  [facility]  PHYSICIAN:  Ollen Gross, M.D.    DATE OF BIRTH:  May 19, 1946  DATE OF ADMISSION:  04/13/2011 DATE OF DISCHARGE:  04/17/2011                        DISCHARGE SUMMARY - REFERRING   DISCHARGE DIAGNOSIS:  Osteoarthritis, right knee.  OTHER DIAGNOSES: 1. Hypertension. 2. Heart murmur. 3. Reflux. 4. Seasonal allergies. 5. Urinary incontinence. 6. Non-insulin-dependent diabetes. 7. Osteoarthritis.  PROCEDURE:  Right total knee arthroplasty by Dr. Lequita [name] on August 13th.  HOSPITAL COURSE:  Katelyn [name] was admitted via the operating room on August 13th at which time she underwent a right total knee arthroplasty. Procedure was performed with Dr. Lequita [name].  She tolerated the procedure well without complications.  Transported to the recovery room and the orthopedic floor in stable condition with intact neurovascular function of the right lower extremity.  Postoperative day #1, hemoglobin 10. Basic metabolic panel showed slightly elevated potassium of 5.2.  She was able to just get up out of bed to chair on postoperative day #1. Postoperative day #2, she was able to pivot and take 2 steps.  She has a slightly elevated temperature to 100.9.  She had a urinalysis which was negative.  Chest x-ray which was negative.  Hemoglobin on postoperative day #2 is 8.8, but the patient is asymptomatic.  Her potassium had normalized to 4.0.  Postoperative day #3, hemoglobin remained stable at 8.8.  She had walked up to 3 and 4 feet on 8/16.  It was felt that she was not progressing adequately with physical therapy and it was felt that her rehab would be best served in a skilled nursing facility.  She is subsequently ready for discharge to skilled nursing facility on postoperative day #4.  DISCHARGE MEDICATIONS: 1. Iron complex 150 mg p.o. daily. 2.  Robaxin 500 mg p.o. q.6 h. p.r.n. muscle spasm. 3. Oxycodone IR 5 to 10 mg p.o. q.4-6 h. p.r.n. pain. 4. Xarelto 10 mg p.o. daily. 5. Actos 45 mg 1 tablet q.a.m. 6. Albuterol inhaler 2 puffs every 6 hours as needed for wheezing. 7. Crestor 5 mg p.o. daily at bedtime. 8. Lasix 20 mg p.o. daily. 9. Januvia 100 mg 1 tablet q.a.m. 10.Lantus insulin 20 units subcutaneous daily q.h.s. 11.Cipro 40 mg q.a.m. 12.Metformin 1000 mg p.o. b.i.d. 13.Nasacort 1 spray nasally daily as needed for allergies. 14.Zantac 150 mg p.o. b.i.d. 15.Zyrtec 10 mg p.o. q.h.s. p.r.n. allergy symptoms.  CONDITION:  Stable and good upon discharge and she is tolerating a regular diet without difficulties.  ACTIVITY:  Weightbearing as tolerated right lower extremity with postoperative total knee arthroplasty protocol.  She may shower as of postoperative day #3.  FOLLOWUP:  Plan is to follow up with Dr. Lequita [name] in 2 weeks postoperatively.  Please call [phone] for appointment.     Ollen Gross, M.D.     FA/MEDQ  D:  04/17/2011  T:  04/17/2011  Job:  [id number]  Electronically Signed by Ollen Gross M.D. on 04/20/2011 04:41:24 PM

## 2012-04-09 ENCOUNTER — Emergency Department (HOSPITAL_COMMUNITY)
Admission: EM | Admit: 2012-04-09 | Discharge: 2012-04-10 | Disposition: A | Discharge status: Home / Self Care | Payer: PRIVATE HEALTH INSURANCE | Attending: Emergency Medicine | Admitting: Emergency Medicine

## 2012-04-09 ENCOUNTER — Encounter (HOSPITAL_COMMUNITY): Payer: Self-pay | Admitting: Emergency Medicine

## 2012-04-09 DIAGNOSIS — M25569 Pain in unspecified knee: Secondary | ICD-10-CM | POA: Insufficient documentation

## 2012-04-09 DIAGNOSIS — I10 Essential (primary) hypertension: Secondary | ICD-10-CM | POA: Insufficient documentation

## 2012-04-09 DIAGNOSIS — M545 Low back pain, unspecified: Secondary | ICD-10-CM | POA: Insufficient documentation

## 2012-04-09 DIAGNOSIS — E119 Type 2 diabetes mellitus without complications: Secondary | ICD-10-CM | POA: Insufficient documentation

## 2012-04-09 DIAGNOSIS — M25519 Pain in unspecified shoulder: Secondary | ICD-10-CM | POA: Insufficient documentation

## 2012-04-09 DIAGNOSIS — Y92009 Unspecified place in unspecified non-institutional (private) residence as the place of occurrence of the external cause: Secondary | ICD-10-CM | POA: Insufficient documentation

## 2012-04-09 DIAGNOSIS — W19XXXA Unspecified fall, initial encounter: Secondary | ICD-10-CM

## 2012-04-09 DIAGNOSIS — W1809XA Striking against other object with subsequent fall, initial encounter: Secondary | ICD-10-CM | POA: Insufficient documentation

## 2012-04-09 HISTORY — DX: Essential (primary) hypertension: I10

## 2012-04-09 NOTE — ED Notes (Signed)
Pt alert, arrives fom work, c/o knee and back pain, onset was today after fall injury, pt ambulates to triage, drove self to Ed, resp even unlabored, skin pwd

## 2012-04-10 ENCOUNTER — Emergency Department (HOSPITAL_COMMUNITY): Payer: PRIVATE HEALTH INSURANCE

## 2012-04-10 MED ORDER — ACETAMINOPHEN 325 MG PO TABS
650.0000 mg | ORAL_TABLET | Freq: Once | ORAL | Status: AC
  Administered 2012-04-10: 650 mg via ORAL
  Filled 2012-04-10: qty 2

## 2012-04-10 MED ORDER — HYDROCODONE-ACETAMINOPHEN 5-325 MG PO TABS: 2.0000 | ORAL_TABLET | ORAL | Status: AC | PRN

## 2012-04-10 NOTE — ED Provider Notes (Signed)
History     CSN: [id number]  Arrival date & time 04/09/12  2041   First MD Initiated Contact with Patient 04/09/12 2328      Chief Complaint  Patient presents with  . Fall  . Back Pain    (Consider location/radiation/quality/duration/timing/severity/associated sxs/prior treatment) HPI Hx from pt. Katelyn [name] is a 66 y.o. female presenting after a fall. States she was cleaning a shower when she slipped. She tried to grab at the grab bar but it came off the wall. She fell onto her L shoulder. She struck her forehead on a shower chair, no LOC with this. She is not on any blood thinners. Currently c/o pain to the L shoulder, her R knee (she is s/p TKA several mos ago), and low back. Pain aching in nature. No treatment prior to coming here. Denies numbness or weakness in her legs. She was ambulatory to the ED and was able to drive herself here.  Past Medical History  Diagnosis Date  . Diabetes mellitus   . Hypertension     Past Surgical History  Procedure Date  . Joint replacement     No family history on file.  History  Substance Use Topics  . Smoking status: Never Smoker   . Smokeless tobacco: Not on file  . Alcohol Use: No    OB History    Grav Para Term Preterm Abortions TAB SAB Ect Mult Living                  Review of Systems  Constitutional: Negative.   Musculoskeletal: Positive for back pain and arthralgias. Negative for myalgias, joint swelling and gait problem.  Skin: Negative for color change and rash.  Neurological: Negative for weakness and numbness.    Allergies  Aspirin; Hydrochlorothiazide; and Ibuprofen  Home Medications   Current Outpatient Rx  Name Route Sig Dispense Refill  . ASPIRIN EC 81 MG PO TBEC Oral Take 81 mg by mouth daily.    [name] DOCUSATE SODIUM 100 MG PO CAPS Oral Take 100 mg by mouth daily.    [name] HYDROCODONE-ACETAMINOPHEN 5-325 MG PO TABS Oral Take 1 tablet by mouth every 8 (eight) hours as needed. For pain    . INSULIN  ASPART PROT & ASPART (70-30) 100 UNIT/ML [place] SUSP Subcutaneous Inject 25 Units into the skin 2 (two) times daily.    [name] LISINOPRIL-HYDROCHLOROTHIAZIDE 20-25 MG PO TABS Oral Take 1 tablet by mouth daily.    [name] METFORMIN HCL 1000 MG PO TABS Oral Take 1,000 mg by mouth 2 (two) times daily with a meal.    . TIZANIDINE HCL 4 MG PO TABS Oral Take 4 mg by mouth every 6 (six) hours as needed. For muscle spasms    . HYDROCODONE-ACETAMINOPHEN 5-325 MG PO TABS Oral Take 2 tablets by mouth every 4 (four) hours as needed for pain. 15 tablet 0    BP 159/88  Pulse 100  Temp 98.7 F (37.1 C) (Oral)  Resp 16  Ht 5\' 5"  (1.651 m)  Wt 273 lb (123.832 kg)  BMI 45.43 kg/m2  SpO2 98%  Physical Exam  Nursing note and vitals reviewed. Constitutional: She is oriented to person, place, and time. She appears well-developed and well-nourished. No distress.  HENT:  Head: Normocephalic and atraumatic.  Mouth/Throat: Oropharynx is clear and moist. No oropharyngeal exudate.  Eyes: EOM are normal. Pupils are equal, round, and reactive to light.  Neck: Normal range of motion.  Cardiovascular: Normal rate, regular rhythm and normal  heart sounds.   Pulmonary/Chest: Effort normal and breath sounds normal. She exhibits no tenderness.  Abdominal: Soft. There is no tenderness. There is no rebound and no guarding.  Musculoskeletal: Normal range of motion.       L shoulder: ttp to trapezius with palp spasm, FROM of shoulder  R knee: well healed scar c/w TKA, FROM, mild prepatellar swelling, no effusion  Spine: No palpable stepoff, crepitus, or gross deformity appreciated. No appreciable spasm of paravertebral muscles. TTP to paraspinal muscles. No midline tenderness.  Neurological: She is alert and oriented to person, place, and time. No cranial nerve deficit. She exhibits normal muscle tone. Coordination normal.  Skin: Skin is warm and dry. She is not diaphoretic.  Psychiatric: She has a normal mood and affect.    ED  Course  Procedures (including critical care time)  Labs Reviewed - No data to display Dg Lumbar Spine Complete  04/10/2012  *RADIOLOGY REPORT*  Clinical Data: Back pain status post fall.  LUMBAR SPINE - COMPLETE 4+ VIEW  Comparison: 08/05/2008  Findings: Lumbarized S1.  Multilevel facet arthropathy.  No acute fracture or dislocation.  Overlying soft tissues within normal limits.  IMPRESSION: No acute osseous abnormality of the lumbar spine.  Original Report Authenticated By: Waneta Martins, M.D.   Dg Knee Complete 4 Views Right  04/10/2012  *RADIOLOGY REPORT*  Clinical Data: Right knee pain status post fall  RIGHT KNEE - COMPLETE 4+ VIEW  Comparison: None.  Findings: Status post right total knee arthroplasty.  No periprosthetic lucency.  No displacement.  There is prepatellar soft tissue fullness/swelling.  IMPRESSION: Right total knee arthroplasty.  No acute osseous finding identified.  If clinical concern for a fracture persists, recommend a repeat radiograph in 5-10 days to evaluate for interval change or callus formation.  Original Report Authenticated By: Waneta Martins, M.D.     1. Fall   2. Knee pain   3. Low back pain       MDM  Pt presents after fall. Currently c/o low back pain, R knee pain primarily. Did hit her head but no LOC, nonfocal neuro exam, no blood thinners. Imaging negative for fx or other acute abnormalities. Pt instructed on findings, reassurance given. Instructed to f/u with Dr. Lequita Halt if persistent, worsening or changing sx with knee. Reasons to return to ED discussed.        Grant Fontana, PA-C 04/10/12 0216

## 2012-04-12 NOTE — ED Provider Notes (Signed)
Medical screening examination/treatment/procedure(s) were performed by non-physician practitioner and as supervising physician I was immediately available for consultation/collaboration.  Juliet Rude. Rubin Payor, MD 04/12/12 [phone]

## 2012-10-24 ENCOUNTER — Emergency Department (HOSPITAL_COMMUNITY): Payer: No Typology Code available for payment source

## 2012-10-24 ENCOUNTER — Encounter (HOSPITAL_COMMUNITY): Payer: Self-pay | Admitting: *Deleted

## 2012-10-24 ENCOUNTER — Emergency Department (HOSPITAL_COMMUNITY)
Admission: EM | Admit: 2012-10-24 | Discharge: 2012-10-24 | Disposition: A | Discharge status: Home / Self Care | Payer: No Typology Code available for payment source | Attending: Emergency Medicine | Admitting: Emergency Medicine

## 2012-10-24 DIAGNOSIS — Z7982 Long term (current) use of aspirin: Secondary | ICD-10-CM | POA: Insufficient documentation

## 2012-10-24 DIAGNOSIS — I10 Essential (primary) hypertension: Secondary | ICD-10-CM | POA: Insufficient documentation

## 2012-10-24 DIAGNOSIS — Y9389 Activity, other specified: Secondary | ICD-10-CM | POA: Insufficient documentation

## 2012-10-24 DIAGNOSIS — IMO0002 Reserved for concepts with insufficient information to code with codable children: Secondary | ICD-10-CM | POA: Insufficient documentation

## 2012-10-24 DIAGNOSIS — S0993XA Unspecified injury of face, initial encounter: Secondary | ICD-10-CM | POA: Insufficient documentation

## 2012-10-24 DIAGNOSIS — Z794 Long term (current) use of insulin: Secondary | ICD-10-CM | POA: Insufficient documentation

## 2012-10-24 DIAGNOSIS — Y92410 Unspecified street and highway as the place of occurrence of the external cause: Secondary | ICD-10-CM | POA: Insufficient documentation

## 2012-10-24 DIAGNOSIS — S161XXA Strain of muscle, fascia and tendon at neck level, initial encounter: Secondary | ICD-10-CM

## 2012-10-24 DIAGNOSIS — Z79899 Other long term (current) drug therapy: Secondary | ICD-10-CM | POA: Insufficient documentation

## 2012-10-24 DIAGNOSIS — S139XXA Sprain of joints and ligaments of unspecified parts of neck, initial encounter: Secondary | ICD-10-CM | POA: Insufficient documentation

## 2012-10-24 DIAGNOSIS — E119 Type 2 diabetes mellitus without complications: Secondary | ICD-10-CM | POA: Insufficient documentation

## 2012-10-24 MED ORDER — CYCLOBENZAPRINE HCL 10 MG PO TABS: 10.0000 mg | ORAL_TABLET | Freq: Three times a day (TID) | ORAL | Status: DC | PRN

## 2012-10-24 MED ORDER — HYDROCODONE-ACETAMINOPHEN 5-325 MG PO TABS: 1.0000 | ORAL_TABLET | Freq: Four times a day (QID) | ORAL | Status: DC | PRN
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
## 2012-10-25 NOTE — ED Provider Notes (Signed)
Medical screening examination/treatment/procedure(s) were performed by non-physician practitioner and as supervising physician I was immediately available for consultation/collaboration.  Jaymin Waln, MD 10/25/12 0007 

## 2013-01-31 ENCOUNTER — Other Ambulatory Visit: Payer: Self-pay | Admitting: Pain Medicine

## 2013-02-13 ENCOUNTER — Other Ambulatory Visit: Payer: Self-pay | Admitting: Pain Medicine

## 2013-02-13 DIAGNOSIS — M545 Low back pain, unspecified: Secondary | ICD-10-CM

## 2013-02-13 DIAGNOSIS — M542 Cervicalgia: Secondary | ICD-10-CM

## 2013-02-24 ENCOUNTER — Ambulatory Visit
Admission: RE | Admit: 2013-02-24 | Discharge: 2013-02-24 | Disposition: A | Discharge status: Home / Self Care | Payer: Medicare Other | Source: Ambulatory Visit | Attending: Pain Medicine | Admitting: Pain Medicine

## 2013-02-24 ENCOUNTER — Other Ambulatory Visit: Payer: Medicare Other

## 2013-02-24 DIAGNOSIS — M545 Low back pain, unspecified: Secondary | ICD-10-CM

## 2013-02-24 DIAGNOSIS — M542 Cervicalgia: Secondary | ICD-10-CM

## 2013-06-09 ENCOUNTER — Institutional Professional Consult (permissible substitution): Payer: Self-pay | Admitting: Pulmonary Disease

## 2013-06-23 ENCOUNTER — Institutional Professional Consult (permissible substitution): Payer: Self-pay | Admitting: Pulmonary Disease

## 2013-06-26 ENCOUNTER — Encounter: Payer: Self-pay | Admitting: Internal Medicine

## 2013-06-26 ENCOUNTER — Ambulatory Visit (INDEPENDENT_AMBULATORY_CARE_PROVIDER_SITE_OTHER): Payer: Medicare Other | Admitting: Internal Medicine

## 2013-06-26 VITALS — BP 126/70 | HR 84 | Ht 65.0 in | Wt 274.2 lb

## 2013-06-26 DIAGNOSIS — G4733 Obstructive sleep apnea (adult) (pediatric): Secondary | ICD-10-CM

## 2013-06-26 DIAGNOSIS — J309 Allergic rhinitis, unspecified: Secondary | ICD-10-CM

## 2013-06-26 DIAGNOSIS — Z23 Encounter for immunization: Secondary | ICD-10-CM

## 2013-06-26 MED ORDER — AZELASTINE-FLUTICASONE 137-50 MCG/ACT NA SUSP: 2.0000 | Freq: Every day | NASAL | Status: DC

## 2013-06-26 NOTE — Progress Notes (Signed)
06/26/13- 59 yoF never smoker referred courtesy of Dr Toy [facility] sleep study Lives alone, but aware of long-time snoring and wakes herself trying to breathe. Can't breathe lying on back unless 2-3 pillows. Not much daytime sleepiness. Rare naps. Always a short sleeper-4-5 hours, since childhood. Not aware of kicking, reflux or wheezing. Denies ENT surgery except tonsils. No hx heart or lung problems, but hypertensive and diabetic.  Bedtime 10-12PM, short latency, nocturia 2-3x, up 6-6:30 AM. Ok driving. Complains of chronic nasal stuffiness w/ some drainage.  Several in family snore and dx'd OSA.   Prior to Admission medications   Medication Sig Start Date End Date Taking? Authorizing Provider  albuterol (PROVENTIL HFA;VENTOLIN HFA) 108 (90 BASE) MCG/ACT inhaler Inhale 2 puffs into the lungs every 6 (six) hours as needed for wheezing.   Yes Historical Provider, MD  aspirin EC 81 MG tablet Take 81 mg by mouth daily.   Yes Historical Provider, MD  Diclofenac-Misoprostol (ARTHROTEC) 75-0.2 MG TBEC Take 1 tablet by mouth 4 (four) times daily as needed.   Yes Historical Provider, MD  Docusate Calcium (STOOL SOFTENER PO) Take 1 tablet by mouth as directed.   Yes Historical Provider, MD  famotidine (PEPCID) 20 MG tablet Take 20 mg by mouth 2 (two) times daily.   Yes Historical Provider, MD  HYDROcodone-acetaminophen (NORCO/VICODIN) 5-325 MG per tablet Take 1 tablet by mouth every 6 (six) hours as needed for pain. 10/24/12  Yes Jamesetta Orleans Lawyer, PA-C  insulin aspart protamine-insulin aspart (NOVOLOG 70/30) (70-30) 100 UNIT/ML injection Inject 40 Units into the skin 2 (two) times daily.    Yes Historical Provider, MD  lisinopril-hydrochlorothiazide (PRINZIDE,ZESTORETIC) 20-25 MG per tablet Take 1 tablet by mouth daily.   Yes Historical Provider, MD  magnesium citrate SOLN Take 1 Bottle by mouth once.   Yes Historical Provider, MD  metFORMIN (GLUCOPHAGE) 1000 MG tablet Take 1,000 mg by mouth 2 (two)  times daily with a meal.   Yes Historical Provider, MD  mometasone (NASONEX) 50 MCG/ACT nasal spray Place 1 spray into the nose 2 (two) times daily.   Yes Historical Provider, MD  Azelastine-Fluticasone (DYMISTA) 137-50 MCG/ACT SUSP Place 2 sprays into both nostrils at bedtime. 06/26/13   Waymon Budge, MD   Past Medical History  Diagnosis Date  . Diabetes mellitus   . Hypertension   . Allergic rhinitis    Past Surgical History  Procedure Laterality Date  . Joint replacement     Family History  Problem Relation Age of Onset  . Prostate cancer Father    History   Social History  . Marital Status: Single    Spouse Name: N/A    Number of Children: N/A  . Years of Education: N/A   Occupational History  . CNA/Costume designer    Social History Main Topics  . Smoking status: Never Smoker   . Smokeless tobacco: Not on file  . Alcohol Use: No  . Drug Use: No  . Sexual Activity: Not on file   Other Topics Concern  . Not on file   Social History Narrative  . No narrative on file   ROS-see HPI Constitutional:   No-   weight loss, night sweats, fevers, chills, fatigue, lassitude. HEENT:   No-  headaches, difficulty swallowing, tooth/dental problems, sore throat,       No-  sneezing, itching, ear ache, +nasal congestion, post nasal drip,  CV:  No-   chest pain, orthopnea, PND, swelling in lower extremities, anasarca, dizziness, palpitations Resp:  No-   shortness of breath with exertion or at rest.              No-   productive cough,  No non-productive cough,  No- coughing up of blood.              No-   change in color of mucus.  No- wheezing.   Skin: No-   rash or lesions. GI:  No-   heartburn, indigestion, abdominal pain, nausea, vomiting, diarrhea,                 change in bowel habits, loss of appetite GU: No-   dysuria, change in color of urine, no urgency or frequency.  No- flank pain. MS:  No-   joint pain or swelling.  No- decreased range of motion.  No- back  pain. Neuro-     nothing unusual Psych:  No- change in mood or affect. No depression or anxiety.  No memory loss.  OBJ- Physical Exam General- Alert, Oriented, Affect-appropriate, Distress- none acute. Obese Skin- rash-none, lesions- none, excoriation- none Lymphadenopathy- none Head- atraumatic            Eyes- Gross vision intact, PERRLA, conjunctivae and secretions clear            Ears- Hearing, canals-normal            Nose- Clear, no-Septal dev, mucus, polyps, erosion, perforation             Throat- Mallampati III, mucosa clear , drainage- none, tonsils- atrophic, +dentures Neck- flexible , trachea midline, no stridor , thyroid nl, carotid no bruit Chest - symmetrical excursion , unlabored           Heart/CV- RRR , no murmur , no gallop  , no rub, nl s1 s2                           - JVD- none , edema- none, stasis changes- none, varices- none           Lung- clear to P&A, wheeze- none, cough- none , dullness-none, rub- none           Chest wall-  Abd- tender-no, distended-no, bowel sounds-present, HSM- no Br/ Gen/ Rectal- Not done, not indicated Extrem- cyanosis- none, clubbing, none, atrophy- none, strength- nl Neuro- grossly intact to observation

## 2013-06-26 NOTE — Assessment & Plan Note (Signed)
Probably seasonal and perennial allergic rhintis. Plan- trial of Dymista nasal spray

## 2013-06-26 NOTE — Patient Instructions (Addendum)
Order- schedule split protocol NPSG   Dx OSA  Sample Dymista nasal spray   1-2 puffs each nostril once daily at bedtime  Flu vax

## 2013-06-26 NOTE — Assessment & Plan Note (Signed)
Hx and physical exam are consistent with obstructive apnea. We discussed physiology, medical concerns, importance of weight and effect of her nasal congestion and sleep position. Discussed her responsibility to drive safely. Plan- schedule split protocol NPSG

## 2013-07-24 ENCOUNTER — Ambulatory Visit (HOSPITAL_BASED_OUTPATIENT_CLINIC_OR_DEPARTMENT_OTHER): Payer: Medicare Other | Attending: Internal Medicine | Admitting: Radiology

## 2013-07-24 VITALS — Ht 65.0 in | Wt 265.0 lb

## 2013-07-24 DIAGNOSIS — G4733 Obstructive sleep apnea (adult) (pediatric): Secondary | ICD-10-CM | POA: Insufficient documentation

## 2013-07-29 DIAGNOSIS — G4733 Obstructive sleep apnea (adult) (pediatric): Secondary | ICD-10-CM

## 2013-07-29 NOTE — Sleep Study (Signed)
[facility]  NAME: Katelyn [name] DATE OF BIRTH:  May 16, 1946 MEDICAL RECORD NUMBER [id number]  LOCATION: [facility]  PHYSICIAN: Keshun Berrett D  DATE OF STUDY: 07/24/2013  SLEEP STUDY TYPE: Nocturnal Polysomnogram               REFERRING PHYSICIAN: Jetty Duhamel D, MD  INDICATION FOR STUDY: Hypersomnia with sleep apnea  EPWORTH SLEEPINESS SCORE:  7/24  HEIGHT: 5\' 5"  (165.1 cm)  WEIGHT: 120.203 kg (265 lb)    Body mass index is 44.1 kg/(m^2).  NECK SIZE: 14.5 in.  MEDICATIONS: charted for review  SLEEP ARCHITECTURE: Total sleep time 295 minutes with sleep efficiency 82.6%. Stage I was 9.8%, stage II 83.7%, stage III  absent, stage REM 6.4% of total sleep time. Sleep latency 8.5 minutes, REM latency 111.5 minutes. Awake after sleep onset 53.5 minutes. Arousal index 26.4. Bedtime medication: None.  RESPIRATORY DATA: Apnea hypopnea index (AHI) 8.3 per hour. A total of 41 events were scored including 6 obstructive apneas, 12 central apneas, 11 mixed apneas, 12 hypoxemia as. Events were more common while supine. REM AHI 9.5 per hour. There were not enough early respiratory events to qualify for split protocol CPAP titration.   OXYGEN DATA: moderate to loud snoring with oxygen desaturation to a nadir of 88% and mean oxygen saturation through the study of 98.3% on room air.   CARDIAC DATA: Sinus rhythm with PVCs.  MOVEMENT/PARASOMNIA: rare limb jerks within significant effect on sleep. Bathroom x1.  IMPRESSION/ RECOMMENDATION:   1) Mild obstructive sleep apnea/hypopnea syndrome, AHI 8.3 per hour. Events were more common while supine. REM AHI 9.5 per hour. Moderate to loud snoring with oxygen desaturation to a nadir of 88% and mean oxygen saturation through the study of 98.3% on room air. 2) There were not enough respiratory events to qualify for CPAP titration on the study night.  Signed Jetty Duhamel M.D. Waymon Budge Diplomate, American  Board of Sleep Medicine  ELECTRONICALLY SIGNED ON:  07/29/2013, 3:37 PM [facility] PH: [phone]   FX: [phone] ACCREDITED BY THE AMERICAN ACADEMY OF SLEEP MEDICINE

## 2013-08-14 ENCOUNTER — Ambulatory Visit: Payer: Medicare Other | Admitting: Internal Medicine

## 2013-08-17 ENCOUNTER — Encounter: Payer: Self-pay | Admitting: Internal Medicine

## 2013-09-15 ENCOUNTER — Encounter: Payer: Self-pay | Admitting: Internal Medicine

## 2013-09-15 ENCOUNTER — Ambulatory Visit (INDEPENDENT_AMBULATORY_CARE_PROVIDER_SITE_OTHER): Payer: Medicare Other | Admitting: Internal Medicine

## 2013-09-15 VITALS — BP 112/70 | HR 86 | Ht 65.0 in | Wt 265.0 lb

## 2013-09-15 DIAGNOSIS — G4733 Obstructive sleep apnea (adult) (pediatric): Secondary | ICD-10-CM

## 2013-09-15 NOTE — Progress Notes (Signed)
06/26/13- 50 yoF never smoker referred courtesy of Dr Mora Appl sleep study Lives alone, but aware of long-time snoring and wakes herself trying to breathe. Can't breathe lying on back unless 2-3 pillows. Not much daytime sleepiness. Rare naps. Always a short sleeper-4-5 hours, since childhood. Not aware of kicking, reflux or wheezing. Denies ENT surgery except tonsils. No hx heart or lung problems, but hypertensive and diabetic.  Bedtime 10-12PM, short latency, nocturia 2-3x, up 6-6:30 AM. [place] driving. Complains of chronic nasal stuffiness w/ some drainage.  Several in family snore and dx'd OSA.   09/15/13-67 yoF never smoker referred courtesy of Dr Emilee Hero Bonsu-no sleep study FOLLOWS FOR: Here today to review sleep study results. NPSG 07/24/13- mild obstructive apnea, AHI 8.3 per hour with desaturation to 88%. Body weight 265 pounds Complains of persistent nasal congestion and drainage. Did like Dymista nasal spray. Had been allergy tested her primary physician office with results unclear.  ROS-see HPI Constitutional:   No-   weight loss, night sweats, fevers, chills, +fatigue, lassitude. HEENT:   No-  headaches, difficulty swallowing, tooth/dental problems, sore throat,       No-  sneezing, itching, ear ache, +nasal congestion, +post nasal drip,  CV:  No-   chest pain, orthopnea, PND, swelling in lower extremities, anasarca, dizziness, palpitations Resp: No-   shortness of breath with exertion or at rest.              No-   productive cough,  No non-productive cough,  No- coughing up of blood.              No-   change in color of mucus.  No- wheezing.   Skin: No-   rash or lesions. GI:  No-   heartburn, indigestion, abdominal pain, nausea, vomiting, GU: . MS:  No-   joint pain or swelling.  . Neuro-     nothing unusual Psych:  No- change in mood or affect. No depression or anxiety.  No memory loss.  OBJ- Physical Exam General- Alert, Oriented, Affect-appropriate, Distress- none acute.  Obese Skin- rash-none, lesions- none, excoriation- none Lymphadenopathy- none Head- atraumatic            Eyes- Gross vision intact, PERRLA, conjunctivae and secretions clear            Ears- Hearing, canals-normal            Nose- Clear, no-Septal dev, mucus, polyps, erosion, perforation             Throat- Mallampati III, mucosa clear , drainage- none, tonsils- atrophic, +dentures Neck- flexible , trachea midline, no stridor , thyroid nl, carotid no bruit Chest - symmetrical excursion , unlabored           Heart/CV- RRR , no murmur , no gallop  , no rub, nl s1 s2                           - JVD- none , edema- none, stasis changes- none, varices- none           Lung- clear to P&A, wheeze- none, cough+ wet , dullness-none, rub- none           Chest wall-  Abd-  Br/ Gen/ Rectal- Not done, not indicated Extrem- cyanosis- none, clubbing, none, atrophy- none, strength- nl Neuro- grossly intact to observation

## 2013-09-15 NOTE — Patient Instructions (Signed)
Order- DME new CPAP autotitrate 5-15 x 7 days for pressure recommendation, mask of choice, humidifier   Dx OSA  Script for Dymista nasal spray   1-2 puffs each nostril once daily at bedtime

## 2013-09-25 ENCOUNTER — Telehealth: Payer: Self-pay | Admitting: Internal Medicine

## 2013-09-25 NOTE — Telephone Encounter (Signed)
Will forward to Dr. Young as an FYI 

## 2013-09-25 NOTE — Telephone Encounter (Signed)
Noted  

## 2013-10-14 NOTE — Assessment & Plan Note (Signed)
Mild obstructive apnea. She wants to try improving nasal congestion with some weight loss as initial intervention. Educated on sleep apnea, importance of weight, responsibility to drive safely.

## 2013-11-17 ENCOUNTER — Ambulatory Visit: Payer: Medicare Other | Admitting: Internal Medicine

## 2014-01-02 ENCOUNTER — Encounter: Payer: Self-pay | Admitting: Physical Medicine & Rehabilitation

## 2014-01-25 ENCOUNTER — Encounter: Payer: Self-pay | Admitting: *Deleted

## 2014-01-25 ENCOUNTER — Encounter: Payer: Medicare HMO | Attending: Internal Medicine | Admitting: *Deleted

## 2014-01-25 VITALS — Ht 65.0 in | Wt 268.6 lb

## 2014-01-25 DIAGNOSIS — E669 Obesity, unspecified: Secondary | ICD-10-CM | POA: Insufficient documentation

## 2014-01-25 DIAGNOSIS — Z713 Dietary counseling and surveillance: Secondary | ICD-10-CM | POA: Insufficient documentation

## 2014-01-25 DIAGNOSIS — E119 Type 2 diabetes mellitus without complications: Secondary | ICD-10-CM

## 2014-01-25 DIAGNOSIS — E1139 Type 2 diabetes mellitus with other diabetic ophthalmic complication: Secondary | ICD-10-CM

## 2014-01-25 DIAGNOSIS — E785 Hyperlipidemia, unspecified: Secondary | ICD-10-CM | POA: Insufficient documentation

## 2014-01-25 DIAGNOSIS — Z794 Long term (current) use of insulin: Secondary | ICD-10-CM | POA: Insufficient documentation

## 2014-01-25 NOTE — Patient Instructions (Signed)
Plan:  Aim for 2 Carb Choices per meal (30 grams) +/- 1 either way  Aim for 0-1 Carbs per snack if hungry  Include protein in moderation with your meals and snacks Consider reading food labels for Total Carbohydrate of foods Consider  increasing your activity level with Silver Sneakers or water exercises as tolerated Consider checking BG at alternate times per day

## 2014-01-25 NOTE — Progress Notes (Signed)
  Medical Nutrition Therapy:  Appt start time: 9390 end time:  1700.  Assessment:  Primary concerns today: patient here for diabetes, hyperlipidemia and obesity. Lives alone but daughter is there a lot. She shops and cooks her own meals. She has a meter and checks her BG twice a day with reported range of 120-200 mg/dl. 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  She is also a caring person for her family and friends.  Preferred Learning Style:   No preference indicated   Learning Readiness:   Ready  Change in progress  MEDICATIONS: see list, diabetes medications are Novolog 70/30 insulin @ 50 units twice a day, Metformin and Byetta   DIETARY INTAKE:  24-hr recall:  B ( AM): 1 pkg oatmeal, usually regular with a flavored pkg too, OR Cream of Wheat, coffee, water Snk ( AM): no  L ( PM): sliced lean meat, homemade soup, occasionally a baked potato, OR salad with lean meat added, water Snk ( PM): no D ( PM): lean meat (rotisserie chicken) with salad, baked potato,  Snk (2-3 AM): fresh fruit Beverages: coffee, water or occasionally bubbly water  Usual physical activity: Silver Sneakers and would like to start water exercises  Estimated energy needs: 1200 calories 135 g carbohydrates 90 g protein 33 g fat  Progress Towards Goal(s):  In progress.   Nutritional Diagnosis:  NB-1.1 Food and nutrition-related knowledge deficit As related to diabetes control.  As evidenced by A1c of 11.0%.    Intervention:  Nutrition counseling and diabetes education initiated. Discussed Carb Counting as method of portion control, reading food labels, and benefits of increased activity. Also reviewed SMBG and rationale of checking BG at alternate times of day to get better idea of her overall control. Explained insulin action of Novolog 70/30 and importance of not skipping meals to prevent hypoglycemia  Plan:  Aim for 2 Carb Choices per meal (30 grams) +/- 1 either way  Aim for 0-1  Carbs per snack if hungry  Include protein in moderation with your meals and snacks Consider reading food labels for Total Carbohydrate of foods Consider  increasing your activity level with Silver Sneakers or water exercises as tolerated Consider checking BG at alternate times per day   Teaching Method Utilized: Visual, Auditory and Hands on  Handouts given during visit include: Living Well with Diabetes Carb Counting and Food Label handouts Meal Plan Card Insulin Action Handout  Barriers to learning/adherence to lifestyle change: lives alone  Demonstrated degree of understanding via:  Teach Back   Monitoring/Evaluation:  Dietary intake, exercise, reading food labels, and body weight in 4-6 week(s).

## 2014-02-16 ENCOUNTER — Ambulatory Visit (HOSPITAL_BASED_OUTPATIENT_CLINIC_OR_DEPARTMENT_OTHER): Payer: Commercial Managed Care - HMO | Admitting: Physical Medicine & Rehabilitation

## 2014-02-16 ENCOUNTER — Encounter: Payer: Self-pay | Admitting: Physical Medicine & Rehabilitation

## 2014-02-16 ENCOUNTER — Encounter: Payer: Medicare HMO | Attending: Physical Medicine & Rehabilitation

## 2014-02-16 VITALS — BP 166/84 | HR 93 | Resp 16 | Ht 65.0 in | Wt 260.0 lb

## 2014-02-16 DIAGNOSIS — M48061 Spinal stenosis, lumbar region without neurogenic claudication: Secondary | ICD-10-CM | POA: Insufficient documentation

## 2014-02-16 DIAGNOSIS — M545 Low back pain, unspecified: Secondary | ICD-10-CM | POA: Insufficient documentation

## 2014-02-16 DIAGNOSIS — M7660 Achilles tendinitis, unspecified leg: Secondary | ICD-10-CM

## 2014-02-16 DIAGNOSIS — G8928 Other chronic postprocedural pain: Secondary | ICD-10-CM | POA: Insufficient documentation

## 2014-02-16 DIAGNOSIS — M24569 Contracture, unspecified knee: Secondary | ICD-10-CM

## 2014-02-16 DIAGNOSIS — M7662 Achilles tendinitis, left leg: Secondary | ICD-10-CM

## 2014-02-16 DIAGNOSIS — E119 Type 2 diabetes mellitus without complications: Secondary | ICD-10-CM | POA: Insufficient documentation

## 2014-02-16 DIAGNOSIS — M25569 Pain in unspecified knee: Secondary | ICD-10-CM | POA: Insufficient documentation

## 2014-02-16 DIAGNOSIS — Z96659 Presence of unspecified artificial knee joint: Secondary | ICD-10-CM | POA: Insufficient documentation

## 2014-02-16 DIAGNOSIS — G8929 Other chronic pain: Secondary | ICD-10-CM | POA: Insufficient documentation

## 2014-02-16 DIAGNOSIS — E785 Hyperlipidemia, unspecified: Secondary | ICD-10-CM | POA: Insufficient documentation

## 2014-02-16 DIAGNOSIS — Z5181 Encounter for therapeutic drug level monitoring: Secondary | ICD-10-CM

## 2014-02-16 DIAGNOSIS — M24562 Contracture, left knee: Secondary | ICD-10-CM

## 2014-02-16 DIAGNOSIS — M48062 Spinal stenosis, lumbar region with neurogenic claudication: Secondary | ICD-10-CM

## 2014-02-16 DIAGNOSIS — I10 Essential (primary) hypertension: Secondary | ICD-10-CM | POA: Insufficient documentation

## 2014-02-16 DIAGNOSIS — Z79899 Other long term (current) drug therapy: Secondary | ICD-10-CM

## 2014-02-16 DIAGNOSIS — M24561 Contracture, right knee: Secondary | ICD-10-CM | POA: Insufficient documentation
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
## 2014-02-16 NOTE — Patient Instructions (Addendum)
Start PT  Achilles Tendinitis Achilles tendinitis is inflammation of the tough, cord-like band that attaches the lower muscles of your leg to your heel (Achilles tendon). It is usually caused by overusing the tendon and joint involved.  CAUSES Achilles tendinitis can happen because of:  A sudden increase in exercise or activity (such as running).  Doing the same exercises or activities (such as jumping) over and over.  Not warming up calf muscles before exercising.  Exercising in shoes that are worn out or not made for exercise.  Having arthritis or a bone growth on the back of the heel bone. This can rub against the tendon and hurt the tendon. SIGNS AND SYMPTOMS The most common symptoms are:  Pain in the back of the leg, just above the heel. The pain usually gets worse with exercise and better with rest.  Stiffness or soreness in the back of the leg, especially in the morning.  Swelling of the skin over the Achilles tendon.  Trouble standing on tiptoe. Sometimes, an Achilles tendon tears (ruptures). Symptoms of an Achilles tendon rupture can include:  Sudden, severe pain in the back of the leg.  Trouble putting weight on the foot or walking normally. DIAGNOSIS Achilles tendinitis will be diagnosed based on symptoms and a physical examination. An X-ray may be done to check if another condition is causing your symptoms. An MRI may be ordered if your health care provider suspects you may have completely torn your tendon, which is called an Achilles tendon rupture.  TREATMENT  Achilles tendinitis usually gets better over time. It can take weeks to months to heal completely. Treatment focuses on treating the symptoms and helping the injury heal. HOME CARE INSTRUCTIONS   Rest your Achilles tendon and avoid activities that cause pain.  Apply ice to the injured area:  Put ice in a plastic bag.  Place a towel between your skin and the bag.  Leave the ice on for 20 minutes, 2-3  times a day  Try to avoid using the tendon (other than gentle range of motion) while the tendon is painful. Do not resume use until instructed by your health care provider. Then begin use gradually. Do not increase use to the point of pain. If pain does develop, decrease use and continue the above measures. Gradually increase activities that do not cause discomfort until you achieve normal use.  Do exercises to make your calf muscles stronger and more flexible. Your health care provider or physical therapist can recommend exercises for you to do.  Wrap your ankle with an elastic bandage or other wrap. This can help keep your tendon from moving too much. Your health care provider will show you how to wrap your ankle correctly.  Only take over-the-counter or prescription medicines for pain, discomfort, or fever as directed by your health care provider. SEEK MEDICAL CARE IF:   Your pain and swelling increase or pain is uncontrolled with medicines.  You develop new, unexplained symptoms or your symptoms get worse.  You are unable to move your toes or foot.  You develop warmth and swelling in your foot.  You have an unexplained temperature. MAKE SURE YOU:   Understand these instructions.  Will watch your condition.  Will get help right away if you are not doing well or get worse. Document Released: 05/27/2005 Document Revised: 06/07/2013 Document Reviewed: 03/29/2013 [facility] Patient Information 2015 [place], [place]. This information is not intended to replace advice given to you by your health care provider. Make  sure you discuss any questions you have with your health care provider.  

## 2014-03-05 ENCOUNTER — Ambulatory Visit: Payer: Medicare HMO | Attending: Physical Medicine & Rehabilitation

## 2014-03-05 DIAGNOSIS — M545 Low back pain, unspecified: Secondary | ICD-10-CM | POA: Insufficient documentation

## 2014-03-05 DIAGNOSIS — R269 Unspecified abnormalities of gait and mobility: Secondary | ICD-10-CM | POA: Insufficient documentation

## 2014-03-05 DIAGNOSIS — M25569 Pain in unspecified knee: Secondary | ICD-10-CM | POA: Insufficient documentation

## 2014-03-12 ENCOUNTER — Ambulatory Visit (INDEPENDENT_AMBULATORY_CARE_PROVIDER_SITE_OTHER): Payer: Medicare HMO | Admitting: General Surgery

## 2014-03-12 ENCOUNTER — Encounter (INDEPENDENT_AMBULATORY_CARE_PROVIDER_SITE_OTHER): Payer: Self-pay | Admitting: General Surgery

## 2014-03-12 VITALS — BP 125/81 | HR 82 | Temp 98.6°F | Resp 16 | Ht 65.0 in | Wt 264.4 lb

## 2014-03-12 DIAGNOSIS — L02212 Cutaneous abscess of back [any part, except buttock]: Secondary | ICD-10-CM

## 2014-03-12 DIAGNOSIS — L02219 Cutaneous abscess of trunk, unspecified: Secondary | ICD-10-CM

## 2014-03-12 DIAGNOSIS — L03319 Cellulitis of trunk, unspecified: Secondary | ICD-10-CM

## 2014-03-12 MED ORDER — SULFAMETHOXAZOLE-TRIMETHOPRIM 400-80 MG PO TABS: 1.0000 | ORAL_TABLET | Freq: Two times a day (BID) | ORAL | Status: AC

## 2014-03-12 NOTE — Progress Notes (Addendum)
Patient ID: Katelyn [name], female   DOB: 1946/03/30, 68 y.o.   MRN: [id number]  Chief Complaint  Patient presents with  . Cyst    back    HPI Katelyn [name] bossiere is a 68 y.o. female.  Referred by Dr. Beverely [name] HPI This is a 68 year old female diabetic who has had a history of a sebaceous cyst on her back lanced. This is then recurred and is now leaking fluid. She has no fevers. She comes back in today to have this evaluated she continues to drain fluid it is tender. Past Medical History  Diagnosis Date  . Diabetes mellitus   . Hypertension   . Allergic rhinitis   . Hyperlipidemia     Past Surgical History  Procedure Laterality Date  . Joint replacement     Bilateral shoulder surgery  Family History  Problem Relation Age of Onset  . Prostate cancer Father     Social History History  Substance Use Topics  . Smoking status: Never Smoker   . Smokeless tobacco: Not on file  . Alcohol Use: No    Allergies  Allergen Reactions  . Aspirin     Unknown reaction  . Hydrochlorothiazide     REACTION: 2 trials of HCTZ with pt intolerance secondary to dizzines  . Ibuprofen     REACTION: also asa,lodine per pt. Causes "shakiness"    Current Outpatient Prescriptions  Medication Sig Dispense Refill  . albuterol (PROVENTIL HFA;VENTOLIN HFA) 108 (90 BASE) MCG/ACT inhaler Inhale 2 puffs into the lungs every 6 (six) hours as needed for wheezing.      [name] aspirin EC 81 MG tablet Take 81 mg by mouth daily.      . Azelastine-Fluticasone (DYMISTA) 137-50 MCG/ACT SUSP Place 2 sprays into both nostrils at bedtime.  1 Bottle  0  . Diclofenac-Misoprostol (ARTHROTEC) 75-0.2 MG TBEC Take 1 tablet by mouth 4 (four) times daily as needed.      Mariane Baumgarten Calcium (STOOL SOFTENER PO) Take 1 tablet by mouth as directed.      [name] exenatide (BYETTA) 5 MCG/0.02ML SOPN injection Inject 5 mcg into the skin 2 (two) times daily with a meal.      . famotidine (PEPCID) 20 MG tablet Take 20 mg by mouth 2  (two) times daily.      . insulin aspart protamine-insulin aspart (NOVOLOG 70/30) (70-30) 100 UNIT/ML injection Inject 40 Units into the skin 2 (two) times daily.       [name] lisinopril-hydrochlorothiazide (PRINZIDE,ZESTORETIC) 20-25 MG per tablet Take 1 tablet by mouth daily.      . magnesium citrate SOLN Take 1 Bottle by mouth once.      . metFORMIN (GLUCOPHAGE) 1000 MG tablet Take 1,000 mg by mouth 2 (two) times daily with a meal.      . mometasone (NASONEX) 50 MCG/ACT nasal spray Place 1 spray into the nose 2 (two) times daily.      [name] sulfamethoxazole-trimethoprim (BACTRIM) 400-80 MG per tablet Take 1 tablet by mouth 2 (two) times daily.  20 tablet  0   No current facility-administered medications for this visit.    Review of Systems Review of Systems  Constitutional: Negative for fever, chills and unexpected weight change.  HENT: Negative for congestion, hearing loss, sore throat, trouble swallowing and voice change.   Eyes: Negative for visual disturbance.  Respiratory: Negative for cough and wheezing.   Cardiovascular: Negative for chest pain, palpitations and leg swelling.  Gastrointestinal: Negative for nausea, vomiting,  abdominal pain, diarrhea, constipation, blood in stool, abdominal distention and anal bleeding.  Genitourinary: Negative for hematuria, vaginal bleeding and difficulty urinating.  Musculoskeletal: Negative for arthralgias.  Skin: Negative for rash and wound.  Neurological: Negative for seizures, syncope and headaches.  Hematological: Negative for adenopathy. Does not bruise/bleed easily.  Psychiatric/Behavioral: Negative for confusion.    Blood pressure 125/81, pulse 82, temperature 98.6 F (37 C), temperature source Temporal, resp. rate 16, height 5\' 5"  (1.651 m), weight 264 lb 6.4 oz (119.931 kg).  Physical Exam Physical Exam  Constitutional: She appears well-developed and well-nourished.  Pulmonary/Chest:      Assessment    Back abscess     Plan      I cleansed  this area. I then anesthetized. I made a cruciate incision and drainage from purulent fluid. I packed this with iodoform. Dressing was placed. She's going to come back Thursday to have the packing removed and this can heal on its own. I'll see her back in about 3 weeks to discuss excision of what I think is likely a sebaceous cyst. Also give her prescription for some Bactrim.        Sho Salguero 03/12/2014, 4:50 PM

## 2014-03-13 ENCOUNTER — Encounter: Payer: Medicare HMO | Attending: Internal Medicine | Admitting: *Deleted

## 2014-03-13 VITALS — Ht 65.0 in | Wt 268.0 lb

## 2014-03-13 DIAGNOSIS — Z713 Dietary counseling and surveillance: Secondary | ICD-10-CM | POA: Insufficient documentation

## 2014-03-13 DIAGNOSIS — E785 Hyperlipidemia, unspecified: Secondary | ICD-10-CM | POA: Insufficient documentation

## 2014-03-13 DIAGNOSIS — Z794 Long term (current) use of insulin: Secondary | ICD-10-CM | POA: Diagnosis not present

## 2014-03-13 DIAGNOSIS — E119 Type 2 diabetes mellitus without complications: Secondary | ICD-10-CM | POA: Diagnosis not present

## 2014-03-13 DIAGNOSIS — E669 Obesity, unspecified: Secondary | ICD-10-CM | POA: Diagnosis not present

## 2014-03-13 NOTE — Progress Notes (Signed)
  Medical Nutrition Therapy:  Appt start time: 0258 end time:  1615.  Assessment:  Primary concerns today: patient here for diabetes, hyperlipidemia and obesity follow up visit. States she is not eating in the middle of the night anymore. She is getting exercise therapy for her back pain instead of "needles". She continues to check her BG twice a day with reported range of 120-230 mg/dl.  Plans to start physical therapy tomorrow and would like to start water exercises after she gets going with the physical therapy.  Preferred Learning Style:   No preference indicated   Learning Readiness:   Ready  Change in progress  MEDICATIONS: see list, diabetes medications are Novolog 70/30 insulin @ 50 units twice a day, Metformin and Byetta   DIETARY INTAKE:  24-hr recall:  B ( AM): 1 pkg oatmeal, usually regular with a flavored pkg too, OR Cream of Wheat, coffee, water Snk ( AM): no  L ( PM): sliced lean meat, homemade soup, occasionally a baked potato, OR salad with lean meat added, water Snk ( PM): no D ( PM): lean meat (rotisserie chicken) with salad, baked potato,  Snk (2-3 AM): fresh fruit Beverages: coffee, water or occasionally bubbly water  Usual physical activity: Silver Sneakers and would like to start water exercises  Estimated energy needs: 1200 calories 135 g carbohydrates 90 g protein 33 g fat  Progress Towards Goal(s):  In progress.   Nutritional Diagnosis:  NB-1.1 Food and nutrition-related knowledge deficit As related to diabetes control.  As evidenced by A1c of 11.0%.    Intervention:  Nutrition counseling and diabetes education continued. Commended her on her Carb Counting and her increased activity. Suggested she check her BG before and after meals for more accurate picture of over all control.   Plan:  Aim for 2 Carb Choices per meal (30 grams) +/- 1 either way  Aim for 0-1 Carbs per snack if hungry  Include protein in moderation with your meals and  snacks Consider reading food labels for Total Carbohydrate of foods Consider  increasing your activity level with Silver Sneakers or water exercises as tolerated Consider checking BG at alternate times per day   Teaching Method Utilized: Visual, Auditory and Hands on  Handouts given during visit include: Living Well with Diabetes Carb Counting and Food Label handouts Meal Plan Card Insulin Action Handout  Barriers to learning/adherence to lifestyle change: lives alone  Demonstrated degree of understanding via:  Teach Back   Monitoring/Evaluation:  Dietary intake, exercise, reading food labels, and body weight in 4 week(s).

## 2014-03-13 NOTE — Patient Instructions (Signed)
Plan:  Aim for 2 Carb Choices per meal (30 grams) +/- 1 either way  Aim for 0-1 Carbs per snack if hungry  Include protein in moderation with your meals and snacks Consider reading food labels for Total Carbohydrate of foods Consider  increasing your activity level with Silver Sneakers or water exercises as tolerated Continue checking BG at alternate times per day

## 2014-03-14 ENCOUNTER — Ambulatory Visit: Payer: Commercial Managed Care - HMO

## 2014-03-15 ENCOUNTER — Encounter (INDEPENDENT_AMBULATORY_CARE_PROVIDER_SITE_OTHER): Payer: Medicare HMO

## 2014-03-16 ENCOUNTER — Ambulatory Visit: Payer: Medicare HMO | Admitting: Physical Therapy

## 2014-03-27 ENCOUNTER — Ambulatory Visit: Payer: Commercial Managed Care - HMO | Admitting: Physical Medicine & Rehabilitation

## 2014-03-29 ENCOUNTER — Ambulatory Visit: Payer: Medicare HMO | Admitting: Physical Therapy

## 2014-03-29 DIAGNOSIS — M545 Low back pain, unspecified: Secondary | ICD-10-CM | POA: Diagnosis present

## 2014-03-29 DIAGNOSIS — M25569 Pain in unspecified knee: Secondary | ICD-10-CM | POA: Diagnosis not present

## 2014-03-29 DIAGNOSIS — R269 Unspecified abnormalities of gait and mobility: Secondary | ICD-10-CM | POA: Diagnosis not present

## 2014-03-30 ENCOUNTER — Encounter: Payer: Medicare HMO | Attending: Physical Medicine & Rehabilitation

## 2014-03-30 ENCOUNTER — Encounter: Payer: Self-pay | Admitting: Physical Medicine & Rehabilitation

## 2014-03-30 ENCOUNTER — Ambulatory Visit (HOSPITAL_BASED_OUTPATIENT_CLINIC_OR_DEPARTMENT_OTHER): Payer: Medicare HMO | Admitting: Physical Medicine & Rehabilitation

## 2014-03-30 VITALS — BP 164/80 | HR 87 | Resp 16 | Ht 65.0 in | Wt 268.0 lb

## 2014-03-30 DIAGNOSIS — I10 Essential (primary) hypertension: Secondary | ICD-10-CM | POA: Diagnosis not present

## 2014-03-30 DIAGNOSIS — Z96659 Presence of unspecified artificial knee joint: Secondary | ICD-10-CM | POA: Insufficient documentation

## 2014-03-30 DIAGNOSIS — M545 Low back pain, unspecified: Secondary | ICD-10-CM | POA: Insufficient documentation

## 2014-03-30 DIAGNOSIS — G8928 Other chronic postprocedural pain: Secondary | ICD-10-CM | POA: Insufficient documentation

## 2014-03-30 DIAGNOSIS — M7662 Achilles tendinitis, left leg: Secondary | ICD-10-CM

## 2014-03-30 DIAGNOSIS — M24561 Contracture, right knee: Secondary | ICD-10-CM

## 2014-03-30 DIAGNOSIS — E785 Hyperlipidemia, unspecified: Secondary | ICD-10-CM | POA: Diagnosis not present

## 2014-03-30 DIAGNOSIS — M48061 Spinal stenosis, lumbar region without neurogenic claudication: Secondary | ICD-10-CM | POA: Diagnosis not present

## 2014-03-30 DIAGNOSIS — E119 Type 2 diabetes mellitus without complications: Secondary | ICD-10-CM | POA: Diagnosis not present

## 2014-03-30 DIAGNOSIS — G8929 Other chronic pain: Secondary | ICD-10-CM | POA: Diagnosis present

## 2014-03-30 DIAGNOSIS — M7660 Achilles tendinitis, unspecified leg: Secondary | ICD-10-CM

## 2014-03-30 DIAGNOSIS — M48062 Spinal stenosis, lumbar region with neurogenic claudication: Secondary | ICD-10-CM

## 2014-03-30 DIAGNOSIS — M25569 Pain in unspecified knee: Secondary | ICD-10-CM | POA: Diagnosis not present

## 2014-03-30 DIAGNOSIS — M24562 Contracture, left knee: Secondary | ICD-10-CM

## 2014-03-30 DIAGNOSIS — M24569 Contracture, unspecified knee: Secondary | ICD-10-CM

## 2014-03-30 MED ORDER — DICLOFENAC-MISOPROSTOL 75-0.2 MG PO TBEC: 1.0000 | DELAYED_RELEASE_TABLET | Freq: Two times a day (BID) | ORAL | Status: DC

## 2014-03-30 MED ORDER — ACETAMINOPHEN-CODEINE #3 300-30 MG PO TABS: 1.0000 | ORAL_TABLET | Freq: Two times a day (BID) | ORAL | Status: DC
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
## 2014-03-30 NOTE — Patient Instructions (Signed)
Stationary bike or aquatics for exercise instead of walking
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
## 2014-04-03 ENCOUNTER — Ambulatory Visit: Payer: Medicare HMO | Attending: Physical Medicine & Rehabilitation | Admitting: Physical Therapy

## 2014-04-03 DIAGNOSIS — M545 Low back pain, unspecified: Secondary | ICD-10-CM | POA: Diagnosis present

## 2014-04-03 DIAGNOSIS — M25569 Pain in unspecified knee: Secondary | ICD-10-CM | POA: Insufficient documentation

## 2014-04-03 DIAGNOSIS — R269 Unspecified abnormalities of gait and mobility: Secondary | ICD-10-CM | POA: Insufficient documentation

## 2014-04-04 ENCOUNTER — Telehealth: Payer: Self-pay | Admitting: *Deleted

## 2014-04-04 NOTE — Telephone Encounter (Signed)
Prior auth for Arthrotec submitted to [facility] through [place] [employer]

## 2014-04-05 ENCOUNTER — Ambulatory Visit (INDEPENDENT_AMBULATORY_CARE_PROVIDER_SITE_OTHER): Payer: Medicare HMO | Admitting: General Surgery

## 2014-04-05 ENCOUNTER — Ambulatory Visit: Payer: Medicare HMO | Admitting: Physical Therapy

## 2014-04-05 ENCOUNTER — Encounter (INDEPENDENT_AMBULATORY_CARE_PROVIDER_SITE_OTHER): Payer: Self-pay | Admitting: General Surgery

## 2014-04-05 VITALS — BP 132/70 | HR 82 | Resp 18 | Ht 65.0 in | Wt 268.0 lb

## 2014-04-05 DIAGNOSIS — L723 Sebaceous cyst: Secondary | ICD-10-CM

## 2014-04-05 DIAGNOSIS — M545 Low back pain, unspecified: Secondary | ICD-10-CM | POA: Diagnosis not present

## 2014-04-05 NOTE — Progress Notes (Signed)
Subjective:     Patient ID: Katelyn [name], female   DOB: [birth year], 68 y.o.   MRN: [id number]  HPI This is a 68 year old female who I drained an abscess from a sebaceous cyst on her back several weeks ago. This has healed. She returns today in followup without any complaints.  Review of Systems     Objective:   Physical Exam Healed upper back incision with underlying palpable cyst    Assessment:     Sebaceous cyst with history of infection     Plan:     We discussed options including observation versus excision. I think due to the fact that this has been infected and there still is a palpable cyst it would be reasonable to excise this area. We discussed excision in the office and the risks associated with that. We'll get her scheduled as soon as we can.

## 2014-04-06 ENCOUNTER — Telehealth: Payer: Self-pay

## 2014-04-06 NOTE — Telephone Encounter (Signed)
Patient's insurance denied coverage for Arthrotec after a PA. Patient must try diclofenac, meloxicam, naproxen, and misoprostol. Please advise.

## 2014-04-07 NOTE — Telephone Encounter (Signed)
May call in Diclofenac 75mg  BID #60 1 RF Misoprostal 200mg  BID  #60 1 RF

## 2014-04-09 MED ORDER — MISOPROSTOL 200 MCG PO TABS: 200.0000 ug | ORAL_TABLET | Freq: Two times a day (BID) | ORAL | Status: DC

## 2014-04-09 MED ORDER — DICLOFENAC SODIUM 75 MG PO TBEC: 75.0000 mg | DELAYED_RELEASE_TABLET | Freq: Two times a day (BID) | ORAL | Status: DC

## 2014-04-09 NOTE — Telephone Encounter (Signed)
Attempted to contact patient to inform her that insurance denied coverage for Arthotec. Dr. Letta Pate prescribed Diclofenac and Misoprostal. RX sent to pharmacy. Left patient a voicemail.

## 2014-04-10 ENCOUNTER — Ambulatory Visit: Payer: Medicare HMO | Admitting: Rehabilitation

## 2014-04-10 DIAGNOSIS — M545 Low back pain, unspecified: Secondary | ICD-10-CM | POA: Diagnosis not present

## 2014-04-12 ENCOUNTER — Ambulatory Visit: Payer: Medicare HMO | Admitting: Rehabilitation

## 2014-04-12 DIAGNOSIS — M545 Low back pain, unspecified: Secondary | ICD-10-CM | POA: Diagnosis not present

## 2014-04-17 ENCOUNTER — Ambulatory Visit: Payer: Medicare HMO

## 2014-04-17 ENCOUNTER — Ambulatory Visit: Payer: Medicare HMO | Admitting: Physical Therapy

## 2014-04-19 ENCOUNTER — Ambulatory Visit: Payer: Commercial Managed Care - HMO | Admitting: *Deleted

## 2014-04-19 ENCOUNTER — Ambulatory Visit: Payer: Medicare HMO | Admitting: Physical Therapy

## 2014-04-19 DIAGNOSIS — M545 Low back pain, unspecified: Secondary | ICD-10-CM | POA: Diagnosis not present

## 2014-04-24 ENCOUNTER — Ambulatory Visit: Payer: Medicare HMO | Admitting: Physical Therapy

## 2014-04-24 DIAGNOSIS — M545 Low back pain, unspecified: Secondary | ICD-10-CM | POA: Diagnosis not present

## 2014-04-26 ENCOUNTER — Ambulatory Visit: Payer: Medicare HMO | Admitting: Physical Therapy

## 2014-04-26 DIAGNOSIS — M545 Low back pain, unspecified: Secondary | ICD-10-CM | POA: Diagnosis not present

## 2014-05-08 ENCOUNTER — Encounter: Payer: Commercial Managed Care - HMO | Admitting: Rehabilitation

## 2014-05-10 ENCOUNTER — Encounter: Payer: Commercial Managed Care - HMO | Admitting: Rehabilitation

## 2014-05-11 ENCOUNTER — Encounter: Payer: Self-pay | Admitting: Physical Medicine & Rehabilitation

## 2014-05-11 ENCOUNTER — Encounter: Payer: Medicare HMO | Attending: Physical Medicine & Rehabilitation

## 2014-05-11 ENCOUNTER — Ambulatory Visit (HOSPITAL_BASED_OUTPATIENT_CLINIC_OR_DEPARTMENT_OTHER): Payer: Medicare HMO | Admitting: Physical Medicine & Rehabilitation

## 2014-05-11 VITALS — BP 181/90 | HR 89 | Resp 16 | Wt 268.8 lb

## 2014-05-11 DIAGNOSIS — M48062 Spinal stenosis, lumbar region with neurogenic claudication: Secondary | ICD-10-CM

## 2014-05-11 DIAGNOSIS — M48061 Spinal stenosis, lumbar region without neurogenic claudication: Secondary | ICD-10-CM | POA: Insufficient documentation

## 2014-05-11 DIAGNOSIS — G8928 Other chronic postprocedural pain: Secondary | ICD-10-CM | POA: Insufficient documentation

## 2014-05-11 DIAGNOSIS — G8929 Other chronic pain: Secondary | ICD-10-CM | POA: Insufficient documentation

## 2014-05-11 DIAGNOSIS — M25569 Pain in unspecified knee: Secondary | ICD-10-CM | POA: Insufficient documentation

## 2014-05-11 DIAGNOSIS — M545 Low back pain, unspecified: Secondary | ICD-10-CM | POA: Diagnosis not present

## 2014-05-11 DIAGNOSIS — Z96659 Presence of unspecified artificial knee joint: Secondary | ICD-10-CM | POA: Diagnosis not present

## 2014-05-11 DIAGNOSIS — E119 Type 2 diabetes mellitus without complications: Secondary | ICD-10-CM | POA: Diagnosis not present

## 2014-05-11 DIAGNOSIS — E785 Hyperlipidemia, unspecified: Secondary | ICD-10-CM | POA: Diagnosis not present

## 2014-05-11 DIAGNOSIS — M7660 Achilles tendinitis, unspecified leg: Secondary | ICD-10-CM | POA: Diagnosis not present

## 2014-05-11 DIAGNOSIS — M5416 Radiculopathy, lumbar region: Secondary | ICD-10-CM

## 2014-05-11 DIAGNOSIS — I10 Essential (primary) hypertension: Secondary | ICD-10-CM | POA: Insufficient documentation

## 2014-05-11 DIAGNOSIS — IMO0002 Reserved for concepts with insufficient information to code with codable children: Secondary | ICD-10-CM

## 2014-05-11 MED ORDER — DICLOFENAC-MISOPROSTOL 75-0.2 MG PO TBEC: 1.0000 | DELAYED_RELEASE_TABLET | Freq: Two times a day (BID) | ORAL | Status: DC

## 2014-05-11 MED ORDER — GABAPENTIN 100 MG PO CAPS: 100.0000 mg | ORAL_CAPSULE | Freq: Three times a day (TID) | ORAL | Status: DC

## 2014-05-11 MED ORDER — ACETAMINOPHEN-CODEINE #4 300-60 MG PO TABS: 1.0000 | ORAL_TABLET | ORAL | Status: DC | PRN

## 2014-05-11 NOTE — Progress Notes (Signed)
Subjective:    Patient ID: Katelyn [name], female    DOB: 03/08/1946, 68 y.o.   MRN: [id number]  HPI Was doing exercise pushing against the wall with knee bent, started 1.5 weeks ago, R buttocks and R Side of thigh, some numbness with that. Other exercise was helpful. Patient was encouraged during therapy up until the setback  Occ weakness, give away, right lower extremity  Still has R knee pain since post op  No heel or achilles pain  Pain Inventory Average Pain 8 Pain Right Now 8 My pain is constant, sharp, burning and tingling  In the last 24 hours, has pain interfered with the following? General activity 10 Relation with others 10 Enjoyment of life 10 What TIME of day is your pain at its worst? all Sleep (in general) Poor  Pain is worse with: walking, bending, sitting, standing and some activites Pain improves with: rest, heat/ice, therapy/exercise and medication Relief from Meds: 3  Mobility walk without assistance ability to climb steps?  no do you drive?  yes  Function employed # of hrs/week 5 I need assistance with the following:  household duties and shopping  Neuro/Psych numbness tingling  Prior Studies Any changes since last visit?  no  Physicians involved in your care Any changes since last visit?  no   Family History  Problem Relation Age of Onset  . Prostate cancer Father    History   Social History  . Marital Status: Single    Spouse Name: N/A    Number of Children: N/A  . Years of Education: N/A   Occupational History  . CNA/Costume designer    Social History Main Topics  . Smoking status: Never Smoker   . Smokeless tobacco: None  . Alcohol Use: No  . Drug Use: No  . Sexual Activity: None   Other Topics Concern  . None   Social History Narrative  . None   Past Surgical History  Procedure Laterality Date  . Joint replacement     Past Medical History  Diagnosis Date  . Diabetes mellitus   . Hypertension   .  Allergic rhinitis   . Hyperlipidemia    BP 181/90  Pulse 89  Resp 16  Wt 268 lb 12.8 oz (121.927 kg)  SpO2 99%  Opioid Risk Score:   Fall Risk Score: Moderate Fall Risk (6-13 points) (previoulsy educated and declined handout) Review of Systems  Musculoskeletal: Positive for back pain.  Neurological: Positive for numbness.       Tingling  All other systems reviewed and are negative.      Objective:   Physical Exam  Nursing note and vitals reviewed. Constitutional: She is oriented to person, place, and time. She appears well-developed and well-nourished.  HENT:  Head: Normocephalic and atraumatic.  Eyes: Conjunctivae and EOM are normal. Pupils are equal, round, and reactive to light.  Neurological: She is alert and oriented to person, place, and time. She has normal reflexes.  Psychiatric: She has a normal mood and affect.    Reduced sensation of pinprick right lateral knee and right lateral leg Motor strength 5/5 bilateral hip flexor knee extensor ankle dorsiflexor.  Lumbar range of motion is reduced 25% extension 25% lateral bending 50% flexion Gait/no evidence of toe drag or knee instability     Assessment & Plan:  1.  Lumbar radiculitis probable R L4  radic with hx of spinal stenosis Patient has exacerbation may have had excessive hyperextension during one of her  physical therapy exercises. Have recommended no PT x2 weeks then resume Changed Tylenol 3 to Tylenol 4 Trial of gabapentin 100 mg 3 times per day Patient requests generic Arthrotec, prescription written  Return to clinic in 4 weeks right L4 transforaminal injection if not much better, will need driver  Discussed with patient agrees with plan

## 2014-05-11 NOTE — Patient Instructions (Signed)
May resume PT in 2 weeks  New med is gabapentin for nerve pain Stronger pain pill is Tylenol #4 Generic Arthotec written

## 2014-05-14 ENCOUNTER — Ambulatory Visit: Payer: Medicare HMO | Attending: Physical Medicine & Rehabilitation | Admitting: Rehabilitation

## 2014-05-14 DIAGNOSIS — M545 Low back pain, unspecified: Secondary | ICD-10-CM | POA: Insufficient documentation

## 2014-05-14 DIAGNOSIS — R269 Unspecified abnormalities of gait and mobility: Secondary | ICD-10-CM | POA: Insufficient documentation

## 2014-05-14 DIAGNOSIS — M25569 Pain in unspecified knee: Secondary | ICD-10-CM | POA: Insufficient documentation

## 2014-05-17 ENCOUNTER — Encounter: Payer: Commercial Managed Care - HMO | Admitting: Rehabilitation

## 2014-05-28 ENCOUNTER — Ambulatory Visit (INDEPENDENT_AMBULATORY_CARE_PROVIDER_SITE_OTHER): Payer: Self-pay | Admitting: General Surgery

## 2014-06-08 ENCOUNTER — Other Ambulatory Visit: Payer: Self-pay | Admitting: Physical Medicine & Rehabilitation

## 2014-06-19 ENCOUNTER — Ambulatory Visit: Payer: Commercial Managed Care - HMO | Admitting: Physical Medicine & Rehabilitation

## 2014-07-05 ENCOUNTER — Encounter: Payer: Self-pay | Admitting: Physical Medicine & Rehabilitation

## 2014-07-05 ENCOUNTER — Encounter: Payer: Medicare HMO | Attending: Physical Medicine & Rehabilitation

## 2014-07-05 ENCOUNTER — Ambulatory Visit (HOSPITAL_BASED_OUTPATIENT_CLINIC_OR_DEPARTMENT_OTHER): Payer: Medicare HMO | Admitting: Physical Medicine & Rehabilitation

## 2014-07-05 VITALS — BP 158/84 | HR 78 | Resp 14 | Ht 65.0 in | Wt 263.0 lb

## 2014-07-05 DIAGNOSIS — M5416 Radiculopathy, lumbar region: Secondary | ICD-10-CM | POA: Insufficient documentation

## 2014-07-05 DIAGNOSIS — M5431 Sciatica, right side: Secondary | ICD-10-CM | POA: Insufficient documentation

## 2014-07-05 MED ORDER — ACETAMINOPHEN-CODEINE #4 300-60 MG PO TABS: 1.0000 | ORAL_TABLET | Freq: Two times a day (BID) | ORAL | Status: DC

## 2014-07-05 NOTE — Patient Instructions (Signed)

## 2014-07-05 NOTE — Progress Notes (Signed)
Lumbar transforaminal epidural steroid injection under fluoroscopic guidance  Indication: Lumbosacral radiculitis is not relieved by medication management or other conservative care and interfering with self-care and mobility.   Informed consent was obtained after describing risk and benefits of the procedure with the patient, this includes bleeding, bruising, infection, paralysis and medication side effects.  The patient wishes to proceed and has given written consent.  Patient was placed in prone position.  The lumbar area was marked and prepped with Betadine.  It was entered with a 25-gauge 1-1/2 inch needle and one mL of 1% lidocaine was injected into the skin and subcutaneous tissue.  Then a 22-gauge 5 in spinal needle was inserted into the R L4-5 intervertebral foramen under AP, lateral, and oblique view.  Then a solution containing one mL of 10 mg per mL dexamethasone and 2 mL of 1% lidocaine was injected.  The patient tolerated procedure well.  Post procedure instructions were given.  Please see post procedure form.

## 2014-07-05 NOTE — Progress Notes (Signed)
  PROCEDURE RECORD [facility]   Name: Katelyn [name] DOB:1946/02/04 MRN: [id number]  Date:07/05/2014  Physician: Alysia Penna, MD    Nurse/CMA: Elena Davia Ginlkel  Allergies:  Allergies  Allergen Reactions  . Aspirin     Unknown reaction  . Hydrochlorothiazide     REACTION: 2 trials of HCTZ with pt intolerance secondary to dizzines  . Ibuprofen     REACTION: also asa,lodine per pt. Causes "shakiness"  . Tramadol     Stomach upset    Consent Signed: Yes.    Is patient diabetic? Yes.    CBG today? .  Pregnant: No. LMP: No LMP recorded. Patient is postmenopausal. (age 2-55)  Anticoagulants: no Anti-inflammatory: no Antibiotics: no  Procedure: Transforaminal epidural spinal injection Right L4  Position: Prone Start Time:  9:37 End Time: 9:40 Fluoro Time: 21 sec  RN/CMA Dorsel Flinn Raylee Strehl    Time 9:03 9:45    BP 158/84 164/88    Pulse 78 100    Respirations 14 14    O2 Sat 99 100    S/S 6/6 6/6    Pain Level 8/10 1/10     D/C home with Fraser Din, patient A & O X 3, D/C instructions reviewed, and sits independently.

## 2014-07-10 ENCOUNTER — Other Ambulatory Visit: Payer: Self-pay | Admitting: Physical Medicine & Rehabilitation

## 2014-08-16 ENCOUNTER — Other Ambulatory Visit: Payer: Self-pay | Admitting: Physical Medicine & Rehabilitation

## 2014-08-16 ENCOUNTER — Ambulatory Visit (HOSPITAL_BASED_OUTPATIENT_CLINIC_OR_DEPARTMENT_OTHER): Payer: Medicare HMO | Admitting: Physical Medicine & Rehabilitation

## 2014-08-16 ENCOUNTER — Encounter: Payer: Self-pay | Admitting: Physical Medicine & Rehabilitation

## 2014-08-16 ENCOUNTER — Encounter: Payer: Medicare HMO | Attending: Physical Medicine & Rehabilitation

## 2014-08-16 VITALS — BP 154/86 | HR 87 | Resp 14

## 2014-08-16 DIAGNOSIS — M5416 Radiculopathy, lumbar region: Secondary | ICD-10-CM

## 2014-08-16 DIAGNOSIS — Z79899 Other long term (current) drug therapy: Secondary | ICD-10-CM

## 2014-08-16 DIAGNOSIS — M5431 Sciatica, right side: Secondary | ICD-10-CM | POA: Diagnosis present

## 2014-08-16 DIAGNOSIS — M5417 Radiculopathy, lumbosacral region: Secondary | ICD-10-CM

## 2014-08-16 DIAGNOSIS — G894 Chronic pain syndrome: Secondary | ICD-10-CM

## 2014-08-16 DIAGNOSIS — Z5181 Encounter for therapeutic drug level monitoring: Secondary | ICD-10-CM

## 2014-08-16 MED ORDER — MISOPROSTOL 200 MCG PO TABS: 200.0000 ug | ORAL_TABLET | Freq: Two times a day (BID) | ORAL | Status: DC

## 2014-08-16 MED ORDER — DICLOFENAC SODIUM 75 MG PO TBEC: 75.0000 mg | DELAYED_RELEASE_TABLET | Freq: Two times a day (BID) | ORAL | Status: DC

## 2014-08-16 NOTE — Patient Instructions (Signed)

## 2014-08-16 NOTE — Progress Notes (Signed)
Right L4-L5 Lumbar transforaminal epidural steroid injection under fluoroscopic guidance  Indication: Lumbosacral radiculitis is not relieved by medication management or other conservative care and interfering with self-care and mobility. Had several weeks of relief after her last transforaminal injection performed on 07/05/2014  Informed consent was obtained after describing risk and benefits of the procedure with the patient, this includes bleeding, bruising, infection, paralysis and medication side effects.  The patient wishes to proceed and has given written consent.  Patient was placed in prone position.  The lumbar area was marked and prepped with Betadine.  It was entered with a 25-gauge 1-1/2 inch needle and one mL of 1% lidocaine was injected into the skin and subcutaneous tissue.  Then a 22-gauge 5 inch spinal needle was inserted into the Right L4-5 intervertebral foramen under AP, lateral, and oblique view.  Then a solution containing one mL of 10 mg per mL dexamethasone and 2 mL of 1% lidocaine was injected.  The patient tolerated procedure well.  Post procedure instructions were given.  Please see post procedure form.

## 2014-08-16 NOTE — Progress Notes (Signed)
  PROCEDURE RECORD [facility]   Name: sharmeka palmisano DOB:02/28/1946 MRN: [id number]  Date:08/16/2014  Physician: Alysia Penna, MD    Nurse/CMA: Mancel Parsons, CMA/Shumaker RN  Allergies:  Allergies  Allergen Reactions  . Aspirin     Unknown reaction  . Hydrochlorothiazide     REACTION: 2 trials of HCTZ with pt intolerance secondary to dizzines  . Ibuprofen     REACTION: also asa,lodine per pt. Causes "shakiness"  . Tramadol     Stomach upset    Consent Signed: Yes.    Is patient diabetic? Yes.    CBG today? 140  08/15/14  Pregnant: No. LMP: No LMP recorded. Patient is postmenopausal. (age 7-55)  Anticoagulants: no Anti-inflammatory: no Antibiotics: no  Procedure: L4-5 Transforaminal Epidural Steroid Injection  Position: Prone Start Time: 9:54 End Time: 10:00 Fluoro Time:23 seconds  RN/CMA Mancel Parsons,  Shumaker RN    Time 9:20 10:05    BP 154/86 159/80    Pulse 87 81    Respirations 14 14    O2 Sat 98 100    S/S 6 6    Pain Level 4/10 0/10     D/C home with Fraser Din, patient A & O X 3, D/C instructions reviewed, and sits independently.

## 2014-08-17 LAB — PMP ALCOHOL METABOLITE (ETG): Ethyl Glucuronide (EtG): NEGATIVE ng/mL

## 2014-08-20 LAB — OPIATES/OPIOIDS (LC/MS-MS)
Codeine Urine: NEGATIVE ng/mL — AB (ref <50)
Hydrocodone: NEGATIVE ng/mL (ref <50)
Hydromorphone: NEGATIVE ng/mL (ref <50)
Morphine Urine: 469 ng/mL (ref <50)
Norhydrocodone, Ur: NEGATIVE ng/mL (ref <50)
Noroxycodone, Ur: NEGATIVE ng/mL (ref <50)
Oxycodone, ur: NEGATIVE ng/mL (ref <50)
Oxymorphone: NEGATIVE ng/mL (ref <50)

## 2014-08-21 LAB — PRESCRIPTION MONITORING PROFILE (SOLSTAS)
Amphetamine/Meth: NEGATIVE ng/mL
Barbiturate Screen, Urine: NEGATIVE ng/mL
Benzodiazepine Screen, Urine: NEGATIVE ng/mL
Buprenorphine, Urine: NEGATIVE ng/mL
Cannabinoid Scrn, Ur: NEGATIVE ng/mL
Carisoprodol, Urine: NEGATIVE ng/mL
Cocaine Metabolites: NEGATIVE ng/mL
Creatinine, Urine: 113.97 mg/dL (ref >20.0)
Fentanyl, Ur: NEGATIVE ng/mL
MDMA URINE: NEGATIVE ng/mL
Meperidine, Ur: NEGATIVE ng/mL
Methadone Screen, Urine: NEGATIVE ng/mL
Nitrites, Initial: NEGATIVE ug/mL
Oxycodone Screen, Ur: NEGATIVE ng/mL
Propoxyphene: NEGATIVE ng/mL
Tapentadol, urine: NEGATIVE ng/mL
Tramadol Scrn, Ur: NEGATIVE ng/mL
Zolpidem, Urine: NEGATIVE ng/mL
pH, Initial: 7.6 pH (ref 4.5–8.9)

## 2014-09-05 NOTE — Progress Notes (Signed)
Urine drug screen for this encounter is consistent for prescribed medication 

## 2014-09-28 ENCOUNTER — Other Ambulatory Visit: Payer: Self-pay | Admitting: Physical Medicine & Rehabilitation

## 2014-11-12 ENCOUNTER — Ambulatory Visit: Payer: Medicare HMO | Admitting: Physical Medicine & Rehabilitation

## 2014-12-08 ENCOUNTER — Other Ambulatory Visit: Payer: Self-pay | Admitting: Physical Medicine & Rehabilitation

## 2014-12-14 ENCOUNTER — Telehealth: Payer: Self-pay | Admitting: *Deleted

## 2014-12-14 ENCOUNTER — Ambulatory Visit: Payer: Self-pay | Admitting: Physical Medicine & Rehabilitation

## 2014-12-14 ENCOUNTER — Encounter: Payer: Medicare Other | Attending: Physical Medicine & Rehabilitation

## 2014-12-14 NOTE — Telephone Encounter (Signed)
Called to find out when her appt date and time is scheduled for.  Unfortunately it was this morning at 9:15.  We will reschedule her.

## 2015-01-14 ENCOUNTER — Encounter: Payer: PPO | Attending: Physical Medicine & Rehabilitation

## 2015-01-14 ENCOUNTER — Ambulatory Visit (HOSPITAL_BASED_OUTPATIENT_CLINIC_OR_DEPARTMENT_OTHER): Payer: Self-pay | Admitting: Physical Medicine & Rehabilitation

## 2015-01-14 ENCOUNTER — Encounter: Payer: Self-pay | Admitting: Physical Medicine & Rehabilitation

## 2015-01-14 VITALS — BP 146/80 | HR 92 | Resp 14

## 2015-01-14 DIAGNOSIS — G894 Chronic pain syndrome: Secondary | ICD-10-CM | POA: Diagnosis present

## 2015-01-14 DIAGNOSIS — M25571 Pain in right ankle and joints of right foot: Secondary | ICD-10-CM | POA: Insufficient documentation

## 2015-01-14 DIAGNOSIS — Z5181 Encounter for therapeutic drug level monitoring: Secondary | ICD-10-CM

## 2015-01-14 DIAGNOSIS — M5416 Radiculopathy, lumbar region: Secondary | ICD-10-CM | POA: Insufficient documentation

## 2015-01-14 DIAGNOSIS — Z79899 Other long term (current) drug therapy: Secondary | ICD-10-CM

## 2015-01-14 NOTE — Patient Instructions (Signed)
Recommend right ankle x-ray you can do this at your primary care office. I suspect that you have osteoarthritis. If this is the case I would recommend right ankle corticosteroid injection under ultrasound guidance
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
## 2015-01-15 ENCOUNTER — Other Ambulatory Visit: Payer: Self-pay | Admitting: Physical Medicine & Rehabilitation

## 2015-01-16 LAB — PMP ALCOHOL METABOLITE (ETG): Ethyl Glucuronide (EtG): NEGATIVE ng/mL

## 2015-01-19 LAB — OPIATES/OPIOIDS (LC/MS-MS)
Codeine Urine: 190 ng/mL — AB (ref <50)
Hydrocodone: NEGATIVE ng/mL (ref <50)
Hydromorphone: NEGATIVE ng/mL (ref <50)
Morphine Urine: 797 ng/mL — AB (ref <50)
Norhydrocodone, Ur: NEGATIVE ng/mL (ref <50)
Noroxycodone, Ur: NEGATIVE ng/mL (ref <50)
Oxycodone, ur: NEGATIVE ng/mL (ref <50)
Oxymorphone: NEGATIVE ng/mL (ref <50)

## 2015-01-22 LAB — PRESCRIPTION MONITORING PROFILE (SOLSTAS)
Amphetamine/Meth: NEGATIVE ng/mL
Barbiturate Screen, Urine: NEGATIVE ng/mL
Benzodiazepine Screen, Urine: NEGATIVE ng/mL
Buprenorphine, Urine: NEGATIVE ng/mL
Cannabinoid Scrn, Ur: NEGATIVE ng/mL
Carisoprodol, Urine: NEGATIVE ng/mL
Cocaine Metabolites: NEGATIVE ng/mL
Creatinine, Urine: 40.82 mg/dL (ref >20.0)
Fentanyl, Ur: NEGATIVE ng/mL
MDMA URINE: NEGATIVE ng/mL
Meperidine, Ur: NEGATIVE ng/mL
Methadone Screen, Urine: NEGATIVE ng/mL
Nitrites, Initial: NEGATIVE ug/mL
Oxycodone Screen, Ur: NEGATIVE ng/mL
Propoxyphene: NEGATIVE ng/mL
Tapentadol, urine: NEGATIVE ng/mL
Tramadol Scrn, Ur: NEGATIVE ng/mL
Zolpidem, Urine: NEGATIVE ng/mL
pH, Initial: 5.6 pH (ref 4.5–8.9)

## 2015-01-23 ENCOUNTER — Other Ambulatory Visit: Payer: Self-pay | Admitting: Physical Medicine & Rehabilitation

## 2015-01-30 NOTE — Progress Notes (Signed)
Urine drug screen for this encounter is consistent for prescribed medication 

## 2015-02-05 ENCOUNTER — Other Ambulatory Visit: Payer: Self-pay | Admitting: Physical Medicine & Rehabilitation

## 2015-02-06 ENCOUNTER — Other Ambulatory Visit: Payer: Self-pay | Admitting: *Deleted

## 2015-02-06 NOTE — Telephone Encounter (Signed)
Pt called asking for a refill on her tylenol #4's. Pt also states she is going to see Dr. Orma Render about her right ankle. She goes on to say that she is also having trouble with her left ankle too and was wondering if we were the ones putting in the orders for the imaging. After reviewing her chart I concluded that Dr. Orma Render would be ordering the x-rays. I called the pt back and informed that she just needs to bring the issue up during her visit with Dr. Orma Render and I also informed that the Tylenol script was phoned into her preferred pharmacy

## 2015-03-04 ENCOUNTER — Other Ambulatory Visit: Payer: Self-pay | Admitting: Physical Medicine & Rehabilitation

## 2015-03-05 NOTE — Telephone Encounter (Signed)
Ms Katelyn [name] called and said she was out of her gabapentin.  She will be making an appt to see [place] after she sees Dr Vista Lawman.  I refilled her medication and notified Erilyn.

## 2015-03-10 ENCOUNTER — Other Ambulatory Visit: Payer: Self-pay | Admitting: Physical Medicine & Rehabilitation

## 2015-03-11 ENCOUNTER — Other Ambulatory Visit: Payer: Self-pay | Admitting: Physical Medicine & Rehabilitation

## 2015-03-11 MED ORDER — ACETAMINOPHEN-CODEINE #4 300-60 MG PO TABS: 1.0000 | ORAL_TABLET | Freq: Two times a day (BID) | ORAL | Status: DC | PRN

## 2015-03-11 NOTE — Telephone Encounter (Signed)
Do you want to refill these medications?  It looks like last Rx was in December. I refilled her Tylenol #4x1 with a request to pharmacy to have her make appt for future refills.

## 2015-04-05 ENCOUNTER — Other Ambulatory Visit: Payer: Self-pay | Admitting: Physical Medicine & Rehabilitation

## 2015-04-08 ENCOUNTER — Telehealth: Payer: Self-pay | Admitting: *Deleted

## 2015-04-08 NOTE — Telephone Encounter (Signed)
Awaiting instructions by MD whether to refill or not

## 2015-04-08 NOTE — Telephone Encounter (Signed)
No further refills until appointment with Katelyn [name] or myself whoever she can get in with

## 2015-04-08 NOTE — Telephone Encounter (Signed)
We received a refill request from [facility] pharmacy for her Tyl #4.  Last refill was 03/11/15 by me with a note to pharmacy to have her schedule appt for further refills.  There is not an appt scheduled. You last saw her on 01/14/15. Please advise.

## 2015-04-09 NOTE — Telephone Encounter (Signed)
Refill request refused and sent back to pharmacy with note must make an appointment for refill.

## 2015-05-07 ENCOUNTER — Encounter: Payer: Self-pay | Admitting: Physical Medicine & Rehabilitation

## 2015-05-07 ENCOUNTER — Ambulatory Visit (HOSPITAL_BASED_OUTPATIENT_CLINIC_OR_DEPARTMENT_OTHER): Payer: PPO | Admitting: Physical Medicine & Rehabilitation

## 2015-05-07 ENCOUNTER — Encounter: Payer: PPO | Attending: Physical Medicine & Rehabilitation

## 2015-05-07 VITALS — BP 149/77 | HR 83 | Resp 14

## 2015-05-07 DIAGNOSIS — M5416 Radiculopathy, lumbar region: Secondary | ICD-10-CM | POA: Diagnosis not present

## 2015-05-07 DIAGNOSIS — E1140 Type 2 diabetes mellitus with diabetic neuropathy, unspecified: Secondary | ICD-10-CM | POA: Insufficient documentation

## 2015-05-07 DIAGNOSIS — M5431 Sciatica, right side: Secondary | ICD-10-CM | POA: Diagnosis not present

## 2015-05-07 DIAGNOSIS — Z79899 Other long term (current) drug therapy: Secondary | ICD-10-CM | POA: Diagnosis not present

## 2015-05-07 DIAGNOSIS — M25571 Pain in right ankle and joints of right foot: Secondary | ICD-10-CM | POA: Diagnosis not present

## 2015-05-07 DIAGNOSIS — G894 Chronic pain syndrome: Secondary | ICD-10-CM | POA: Diagnosis present

## 2015-05-07 DIAGNOSIS — M24561 Contracture, right knee: Secondary | ICD-10-CM | POA: Diagnosis not present

## 2015-05-07 DIAGNOSIS — M4806 Spinal stenosis, lumbar region: Secondary | ICD-10-CM | POA: Diagnosis not present

## 2015-05-07 DIAGNOSIS — Z5181 Encounter for therapeutic drug level monitoring: Secondary | ICD-10-CM | POA: Diagnosis not present

## 2015-05-07 DIAGNOSIS — M24562 Contracture, left knee: Secondary | ICD-10-CM

## 2015-05-07 DIAGNOSIS — E1142 Type 2 diabetes mellitus with diabetic polyneuropathy: Secondary | ICD-10-CM | POA: Diagnosis not present

## 2015-05-07 DIAGNOSIS — M48062 Spinal stenosis, lumbar region with neurogenic claudication: Secondary | ICD-10-CM

## 2015-05-07 MED ORDER — TOLTERODINE TARTRATE 2 MG PO TABS: 1.0000 mg | ORAL_TABLET | Freq: Two times a day (BID) | ORAL | Status: DC

## 2015-05-07 MED ORDER — GABAPENTIN 100 MG PO CAPS: 200.0000 mg | ORAL_CAPSULE | Freq: Three times a day (TID) | ORAL | Status: DC

## 2015-05-07 MED ORDER — ACETAMINOPHEN-CODEINE #4 300-60 MG PO TABS: 1.0000 | ORAL_TABLET | Freq: Two times a day (BID) | ORAL | Status: DC | PRN

## 2015-05-07 NOTE — Progress Notes (Signed)
Subjective:    Patient ID: Katelyn [name], female    DOB: 02/27/1946, 69 y.o.   MRN: [id number]  HPI  Pain going down the right lower extremity from the Buttocks to right hip to thigh to right foot and ankle area. She also has some pain in the left ankle and foot area. It is not clear whether this is coming down from the back and buttocks area however  She has some numbness in the right lateral leg and occasional numbness in the left lateral leg.  Pain Inventory Average Pain 8 Pain Right Now 8 My pain is constant, sharp, stabbing and tingling  In the last 24 hours, has pain interfered with the following? General activity 8 Relation with others 8 Enjoyment of life 7 What TIME of day is your pain at its worst? all Sleep (in general) Poor  Pain is worse with: walking, bending, sitting, standing and some activites Pain improves with: rest, heat/ice, therapy/exercise and medication Relief from Meds: 8  Mobility walk without assistance ability to climb steps?  no do you drive?  yes  Function employed # of hrs/week 5 I need assistance with the following:  household duties and shopping  Neuro/Psych numbness tingling  Prior Studies Any changes since last visit?  no  Physicians involved in your care Any changes since last visit?  no   Family History  Problem Relation Age of Onset  . Prostate cancer Father    Social History   Social History  . Marital Status: Single    Spouse Name: N/A  . Number of Children: N/A  . Years of Education: N/A   Occupational History  . CNA/Costume designer    Social History Main Topics  . Smoking status: Never Smoker   . Smokeless tobacco: None  . Alcohol Use: No  . Drug Use: No  . Sexual Activity: Not Asked   Other Topics Concern  . None   Social History Narrative   Past Surgical History  Procedure Laterality Date  . Joint replacement     Past Medical History  Diagnosis Date  . Diabetes mellitus   . Hypertension    . Allergic rhinitis   . Hyperlipidemia    BP 149/77 mmHg  Pulse 83  Resp 14  SpO2 98%  Opioid Risk Score:   Fall Risk Score:  `1  Depression screen PHQ 2/9  Depression screen [facility] 2/9 05/07/2015 01/25/2014  Decreased Interest 0 0  Down, Depressed, Hopeless 0 0  PHQ - 2 Score 0 0     Review of Systems  Neurological: Positive for numbness.       Tingling  All other systems reviewed and are negative.      Objective:   Physical Exam  Negative straight leg raising test Reduced sensation light touch and pinprick right foot greater than left foot. Motor strength is 5/5 bilateral hip flexor and extensor ankle dorsi flexor plantar flexor Deep reflexes 1+ bilateral knees and ankles. Back has some tenderness palpation bilateral lumbar paraspinals. She has decreased lumbar spine range of motion.      Assessment & Plan:  1. Chronic radicular low back pain and this has been improved after transforaminal lumbar injections. Patient now has recurrence of right lower extremity pain in a pattern consistent with L4 nerve root distribution. Recommend repeat  Right L4-L5 transforaminal injections Next month. Last year required 2 injections to get under control.  Review of MRI dated 02/21/2013 demonstrated severe L5-S1 central spinal stenosis, moderate L4-L5  central spinal stenosis  2. Right ankle pain I do not think this is radicular in appears to be more joint related , If there is x-ray evidence of this, then may need ultrasound guided ankle joint injection.If there is no evidence of ankle arthritis, it is possible that this is radicular Pain and may benefit from transforaminal injection.   is having difficulty with ambulation, will Complete a handicap parking placard form.

## 2015-05-07 NOTE — Patient Instructions (Addendum)
Next visit is for L4-5 injection, will need driver  Increased gabapentin  New bladder med detrol

## 2015-06-10 ENCOUNTER — Ambulatory Visit: Payer: PPO | Admitting: Physical Medicine & Rehabilitation

## 2015-06-14 ENCOUNTER — Encounter: Payer: Self-pay | Admitting: Physical Medicine & Rehabilitation

## 2015-06-14 ENCOUNTER — Encounter: Payer: PPO | Attending: Physical Medicine & Rehabilitation

## 2015-06-14 ENCOUNTER — Ambulatory Visit (HOSPITAL_BASED_OUTPATIENT_CLINIC_OR_DEPARTMENT_OTHER): Payer: PPO | Admitting: Physical Medicine & Rehabilitation

## 2015-06-14 VITALS — BP 162/81 | HR 88 | Resp 14

## 2015-06-14 DIAGNOSIS — M5416 Radiculopathy, lumbar region: Secondary | ICD-10-CM

## 2015-06-14 DIAGNOSIS — Z79899 Other long term (current) drug therapy: Secondary | ICD-10-CM | POA: Insufficient documentation

## 2015-06-14 DIAGNOSIS — M25571 Pain in right ankle and joints of right foot: Secondary | ICD-10-CM | POA: Diagnosis not present

## 2015-06-14 DIAGNOSIS — G894 Chronic pain syndrome: Secondary | ICD-10-CM

## 2015-06-14 DIAGNOSIS — Z5181 Encounter for therapeutic drug level monitoring: Secondary | ICD-10-CM

## 2015-06-14 MED ORDER — GABAPENTIN 300 MG PO CAPS: 300.0000 mg | ORAL_CAPSULE | Freq: Three times a day (TID) | ORAL | Status: DC

## 2015-06-14 MED ORDER — ACETAMINOPHEN-CODEINE #4 300-60 MG PO TABS: 1.0000 | ORAL_TABLET | Freq: Two times a day (BID) | ORAL | Status: DC | PRN

## 2015-06-14 NOTE — Progress Notes (Signed)
  PROCEDURE RECORD [facility]   Name: Katelyn [name] DOB:10-May-1946 MRN: [id number]  Date:06/14/2015  Physician: Alysia Penna, MD    Nurse/CMA: Mancel Parsons Allergies:  Allergies  Allergen Reactions  . Aspirin     Unknown reaction  . Hydrochlorothiazide     REACTION: 2 trials of HCTZ with pt intolerance secondary to dizzines  . Ibuprofen     REACTION: also asa,lodine per pt. Causes "shakiness"  . Tramadol     Stomach upset    Consent Signed: Yes.    Is patient diabetic? Yes.    CBG today? 143  Pregnant: No. LMP: No LMP recorded. Patient is postmenopausal. (age 51-55)  Anticoagulants: no Anti-inflammatory: no Antibiotics: yes (for a cyst on her back)  Procedure: right transforaminal epidural steroid injection Position: Prone Start Time: 3:57pm End Time: 4:03pm  Fluoro Time: 26  RN/CMA Rolan Bucco Kaelan Emami    Time 3:10pm 4:09 pm    BP 162/81 158/76    Pulse 88 80    Respirations 14 14    O2 Sat 100 99    S/S 6 6    Pain Level 9/10 5/10      D/C home with Marlowe Sax, patient A & O X 3, D/C instructions reviewed, and sits independently.

## 2015-06-14 NOTE — Progress Notes (Signed)
Lumbar transforaminal epidural steroid injection under fluoroscopic guidance  Indication: Lumbosacral radiculitis is not relieved by medication management or other conservative care and interfering with self-care and mobility.   Informed consent was obtained after describing risk and benefits of the procedure with the patient, this includes bleeding, bruising, infection, paralysis and medication side effects.  The patient wishes to proceed and has given written consent.  Patient was placed in prone position.  The lumbar area was marked and prepped with Betadine.  It was entered with a 25-gauge 1-1/2 inch needle and one mL of 1% lidocaine was injected into the skin and subcutaneous tissue.  Then a 22-gauge 5in spinal needle was inserted into the R L4-5  intervertebral foramen under AP, lateral, and oblique view.  Then a solution containing one mL of 10 mg per mL dexamethasone and 2 mL of 1% lidocaine was injected.  The patient tolerated procedure well.  Post procedure instructions were given.  Please see post procedure form.

## 2015-06-14 NOTE — Patient Instructions (Signed)

## 2015-06-17 ENCOUNTER — Other Ambulatory Visit: Payer: Self-pay | Admitting: Physical Medicine & Rehabilitation

## 2015-06-17 NOTE — Addendum Note (Signed)
Addended by: Geryl Rankins D on: 06/17/2015 11:27 AM   Modules accepted: Orders

## 2015-06-18 LAB — PMP ALCOHOL METABOLITE (ETG)

## 2015-06-22 LAB — OPIATES/OPIOIDS (LC/MS-MS)
Codeine Urine: 707 ng/mL — AB (ref <50)
Hydrocodone: NEGATIVE ng/mL (ref <50)
Hydromorphone: NEGATIVE ng/mL (ref <50)
Morphine Urine: 2269 ng/mL — AB (ref <50)
Norhydrocodone, Ur: NEGATIVE ng/mL (ref <50)
Noroxycodone, Ur: NEGATIVE ng/mL (ref <50)
Oxycodone, ur: NEGATIVE ng/mL (ref <50)
Oxymorphone: NEGATIVE ng/mL (ref <50)

## 2015-06-22 LAB — ETHYL GLUCURONIDE, URINE
Ethyl Glucuronide (EtG): 867 ng/mL — ABNORMAL HIGH (ref <500)
Ethyl Sulfate (ETS): 442 ng/mL — ABNORMAL HIGH (ref <100)

## 2015-06-25 LAB — PRESCRIPTION MONITORING PROFILE (SOLSTAS)
Amphetamine/Meth: NEGATIVE ng/mL
Barbiturate Screen, Urine: NEGATIVE ng/mL
Benzodiazepine Screen, Urine: NEGATIVE ng/mL
Buprenorphine, Urine: NEGATIVE ng/mL
Cannabinoid Scrn, Ur: NEGATIVE ng/mL
Carisoprodol, Urine: NEGATIVE ng/mL
Cocaine Metabolites: NEGATIVE ng/mL
Creatinine, Urine: 81.16 mg/dL (ref >20.0)
Fentanyl, Ur: NEGATIVE ng/mL
MDMA URINE: NEGATIVE ng/mL
Meperidine, Ur: NEGATIVE ng/mL
Methadone Screen, Urine: NEGATIVE ng/mL
Nitrites, Initial: NEGATIVE ug/mL
Oxycodone Screen, Ur: NEGATIVE ng/mL
Propoxyphene: NEGATIVE ng/mL
Tapentadol, urine: NEGATIVE ng/mL
Tramadol Scrn, Ur: NEGATIVE ng/mL
Zolpidem, Urine: NEGATIVE ng/mL
pH, Initial: 6.4 pH (ref 4.5–8.9)

## 2015-07-11 ENCOUNTER — Ambulatory Visit: Payer: PPO | Admitting: Physical Medicine & Rehabilitation

## 2015-07-11 NOTE — Progress Notes (Signed)
Urine drug screen for this encounter is consistent for prescribed medication 

## 2015-07-13 ENCOUNTER — Other Ambulatory Visit: Payer: Self-pay | Admitting: Physical Medicine & Rehabilitation

## 2015-07-15 NOTE — Telephone Encounter (Signed)
Dr. Letta Pate Do we Rx Cytotec for patients? She is requesting this medication and diclofenac tablets. Please Advise.

## 2015-07-15 NOTE — Telephone Encounter (Signed)
Arthrotec was a combination of these 2 medications. This is a way to combine these medicines with generic.

## 2015-07-16 ENCOUNTER — Encounter: Payer: PPO | Attending: Physical Medicine & Rehabilitation

## 2015-07-16 ENCOUNTER — Encounter: Payer: Self-pay | Admitting: Physical Medicine & Rehabilitation

## 2015-07-16 ENCOUNTER — Ambulatory Visit (HOSPITAL_BASED_OUTPATIENT_CLINIC_OR_DEPARTMENT_OTHER): Payer: PPO | Admitting: Physical Medicine & Rehabilitation

## 2015-07-16 VITALS — BP 155/82 | HR 95 | Resp 14

## 2015-07-16 DIAGNOSIS — M5417 Radiculopathy, lumbosacral region: Secondary | ICD-10-CM | POA: Diagnosis not present

## 2015-07-16 DIAGNOSIS — M5416 Radiculopathy, lumbar region: Secondary | ICD-10-CM | POA: Diagnosis not present

## 2015-07-16 DIAGNOSIS — M25571 Pain in right ankle and joints of right foot: Secondary | ICD-10-CM | POA: Insufficient documentation

## 2015-07-16 DIAGNOSIS — Z79899 Other long term (current) drug therapy: Secondary | ICD-10-CM | POA: Insufficient documentation

## 2015-07-16 DIAGNOSIS — Z5181 Encounter for therapeutic drug level monitoring: Secondary | ICD-10-CM | POA: Diagnosis not present

## 2015-07-16 DIAGNOSIS — G894 Chronic pain syndrome: Secondary | ICD-10-CM | POA: Insufficient documentation

## 2015-07-16 NOTE — Progress Notes (Signed)
  PROCEDURE RECORD [facility]   Name: Katelyn [name] DOB:31-Aug-1946 MRN: [id number]  Date:07/16/2015  Physician: Alysia Penna, MD    Nurse/CMA: Mancel Parsons  Allergies:  Allergies  Allergen Reactions  . Aspirin     Unknown reaction  . Hydrochlorothiazide     REACTION: 2 trials of HCTZ with pt intolerance secondary to dizzines  . Ibuprofen     REACTION: also asa,lodine per pt. Causes "shakiness"  . Tramadol     Stomach upset    Consent Signed: Yes.    Is patient diabetic? Yes.    CBG today? 153  Pregnant: No. LMP: No LMP recorded. Patient is postmenopausal. (age 52-55)  Anticoagulants: no Anti-inflammatory: no Antibiotics: no  Procedure: right transforaminal epidural steroid injection  Position: Prone Start Time: 3:31pm  End Time: 3:36pm  Fluoro Time:25  RN/CMA Rolan Bucco Wael Maestas    Time 3:12pm 3:41pm    BP 155/82 151/63    Pulse 95 91    Respirations 14 14    O2 Sat 95 100    S/S 6 6    Pain Level 7/10 0/10     D/C home with friend, patient A & O X 3, D/C instructions reviewed, and sits independently.

## 2015-07-16 NOTE — Progress Notes (Signed)
Lumbar transforaminal epidural steroid injection under fluoroscopic guidance  Indication: Lumbosacral radiculitis is not relieved by medication management or other conservative care and interfering with self-care and mobility.   Informed consent was obtained after describing risk and benefits of the procedure with the patient, this includes bleeding, bruising, infection, paralysis and medication side effects.  The patient wishes to proceed and has given written consent.  Patient was placed in prone position.  The lumbar area was marked and prepped with Betadine.  It was entered with a 25-gauge 1-1/2 inch needle and one mL of 1% lidocaine was injected into the skin and subcutaneous tissue.  Then a 22-gauge 5in spinal needle was inserted into the R L4-5  intervertebral foramen under AP, lateral, and oblique view.  Then a solution containing one mL of 10 mg per mL dexamethasone and 2 mL of 1% lidocaine was injected.  The patient tolerated procedure well.  Post procedure instructions were given.  Please see post procedure form. 

## 2015-07-16 NOTE — Patient Instructions (Signed)

## 2015-08-12 ENCOUNTER — Ambulatory Visit: Payer: PPO | Admitting: Physical Medicine & Rehabilitation

## 2015-08-12 ENCOUNTER — Encounter: Payer: PPO | Attending: Physical Medicine & Rehabilitation

## 2015-08-12 DIAGNOSIS — Z79899 Other long term (current) drug therapy: Secondary | ICD-10-CM | POA: Insufficient documentation

## 2015-08-12 DIAGNOSIS — M5416 Radiculopathy, lumbar region: Secondary | ICD-10-CM | POA: Insufficient documentation

## 2015-08-12 DIAGNOSIS — Z5181 Encounter for therapeutic drug level monitoring: Secondary | ICD-10-CM | POA: Insufficient documentation

## 2015-08-12 DIAGNOSIS — G894 Chronic pain syndrome: Secondary | ICD-10-CM | POA: Insufficient documentation

## 2015-08-12 DIAGNOSIS — M25571 Pain in right ankle and joints of right foot: Secondary | ICD-10-CM | POA: Insufficient documentation

## 2015-08-14 ENCOUNTER — Telehealth: Payer: Self-pay | Admitting: *Deleted

## 2015-08-14 ENCOUNTER — Other Ambulatory Visit: Payer: Self-pay | Admitting: Physical Medicine & Rehabilitation

## 2015-08-14 NOTE — Telephone Encounter (Signed)
Pt says she called this morning, and expressed disappointment that we had not returned her call. She would appreciate if we would call back.......I made an attempt to contact patient, left message for her to call us back

## 2015-08-15 NOTE — Telephone Encounter (Signed)
Spoke with pt. Pt. Advised that gabapentin has been called in to pharmacy yesterday. Patient verbalized understanding.

## 2015-09-06 ENCOUNTER — Ambulatory Visit: Payer: PPO | Admitting: Physical Medicine & Rehabilitation

## 2015-09-13 ENCOUNTER — Other Ambulatory Visit: Payer: Self-pay | Admitting: Physical Medicine & Rehabilitation

## 2015-09-16 ENCOUNTER — Encounter: Payer: Self-pay | Admitting: Physical Medicine & Rehabilitation

## 2015-09-16 ENCOUNTER — Ambulatory Visit (HOSPITAL_BASED_OUTPATIENT_CLINIC_OR_DEPARTMENT_OTHER): Payer: PPO | Admitting: Physical Medicine & Rehabilitation

## 2015-09-16 ENCOUNTER — Encounter: Payer: PPO | Attending: Physical Medicine & Rehabilitation

## 2015-09-16 VITALS — BP 161/76 | HR 106 | Resp 14

## 2015-09-16 DIAGNOSIS — M25571 Pain in right ankle and joints of right foot: Secondary | ICD-10-CM | POA: Insufficient documentation

## 2015-09-16 DIAGNOSIS — Z79899 Other long term (current) drug therapy: Secondary | ICD-10-CM | POA: Diagnosis not present

## 2015-09-16 DIAGNOSIS — G894 Chronic pain syndrome: Secondary | ICD-10-CM | POA: Diagnosis not present

## 2015-09-16 DIAGNOSIS — M48062 Spinal stenosis, lumbar region with neurogenic claudication: Secondary | ICD-10-CM

## 2015-09-16 DIAGNOSIS — M4806 Spinal stenosis, lumbar region: Secondary | ICD-10-CM | POA: Diagnosis not present

## 2015-09-16 DIAGNOSIS — Z5181 Encounter for therapeutic drug level monitoring: Secondary | ICD-10-CM

## 2015-09-16 DIAGNOSIS — M5416 Radiculopathy, lumbar region: Secondary | ICD-10-CM | POA: Insufficient documentation

## 2015-09-16 MED ORDER — ACETAMINOPHEN-CODEINE #4 300-60 MG PO TABS: 1.0000 | ORAL_TABLET | Freq: Two times a day (BID) | ORAL | Status: DC | PRN

## 2015-09-16 MED ORDER — DICLOFENAC-MISOPROSTOL 75-0.2 MG PO TBEC: 1.0000 | DELAYED_RELEASE_TABLET | Freq: Two times a day (BID) | ORAL | Status: DC

## 2015-09-16 NOTE — Patient Instructions (Signed)
No alcohol use when prescribed pain medicine

## 2015-09-16 NOTE — Progress Notes (Signed)
Subjective:    Patient ID: Katelyn [name], female    DOB: 12-14-1945, 70 y.o.   MRN: [id number]  HPI Fall at home last week on to buttocks, pulling something out of freezer  Tries to walk on a daily basis, feels like sometimes it is coming from her back Patient complains of knee pain with walking. Most this time this is associated with back pain. She's had bilateral knee replacement and has had orthopedic follow-up showing no complications with the total knee replacements.  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 General activity 5 Relation with others 5 Enjoyment of life 5 What TIME of day is your pain at its worst? morning, night Sleep (in general) Fair  Pain is worse with: walking and inactivity Pain improves with: rest, heat/ice, medication and injections Relief from Meds: 5  Mobility walk without assistance how many minutes can you walk? 15 ability to climb steps?  yes do you drive?  yes transfers alone Do you have any goals in this area?  yes  Function disabled: date disabled .  Neuro/Psych No problems in this area  Prior Studies Any changes since last visit?  no Clinical Data: History of sciatica, right leg pain  MRI LUMBAR SPINE WITHOUT CONTRAST  Technique: Multiplanar and multiecho pulse sequences of the lumbar spine were obtained without intravenous contrast.  Comparison: Prior radiograph from 10/24/2012  Findings: The S1 segment is transitional. There is trace anterolisthesis of L5-1 S1. Otherwise, the vertebral bodies are normally aligned with preservation of the normal lumbar lordosis. Vertebral body heights are preserved. Signal intensity from the vertebral body bone marrow spinal cord is normal.  At L1-2, there is mild diffuse disc bulge with disc  desiccation. No significant facet arthrosis. No canal or neural foraminal stenosis.  At L2-3, there is mild bilateral neural foraminal narrowing largely due to mild facet hypertrophy. There is no significant disc bulge. No focal disc protrusion. No canal stenosis.  At L3-4, there is bilateral facet hypertrophy with ligamentum flavum thickening and minimal disc desiccation. No significant disc bulge or focal disc protrusion. There is mild bilateral neural foraminal narrowing, left worse than right, largely due to facet hypertrophy.  At L4-5, there is bilateral facet arthrosis with ligamentum flavum thickening and mild diffuse disc bulge. There is resultant moderate central canal stenosis, with the thecal sac measuring 12.0 mm in AP diameter. There is mild bilateral foraminal narrowing.  At L5 S1, there is diffuse disc bulge with disc desiccation and severe bilateral facet arthropathy. Prominent fluid signal intensity is seen in the L5-S1 facets bilaterally. There is prominent ligamentum flavum hypertrophy. No focal disc protrusion. There is resultant severe central canal stenosis with the thecal sac measuring 8 mm in AP diameter. Severe bilateral foraminal stenosis also present.  IMPRESSION: 1. Degenerative disc bulge with exuberant bilateral facet arthrosis at L5-S1 resulting in severe central canal and bilateral foraminal stenosis. 2. Additional multilevel degenerative disc disease and facet arthrosis as detailed above, most prominent at the L4-5 level where there is moderate central canal stenosis. 3. 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   Original Report Authenticated By: Jeannine Boga, M.D. Physicians involved in your care Any changes since last visit?  no   Family History  Problem Relation Age of Onset  . Prostate cancer Father  Social History   Social History  . Marital Status: Single    Spouse Name: N/A  . Number of  Children: N/A  . Years of Education: N/A   Occupational History  . CNA/Costume designer    Social History Main Topics  . Smoking status: Never Smoker   . Smokeless tobacco: None  . Alcohol Use: No  . Drug Use: No  . Sexual Activity: Not Asked   Other Topics Concern  . None   Social History Narrative   Past Surgical History  Procedure Laterality Date  . Joint replacement     Past Medical History  Diagnosis Date  . Diabetes mellitus   . Hypertension   . Allergic rhinitis   . Hyperlipidemia    BP 161/76 mmHg  Pulse 106  Resp 14  SpO2 100%  Opioid Risk Score:   Fall Risk Score:  `1  Depression screen PHQ 2/9  Depression screen [facility] 2/9 05/07/2015 01/25/2014  Decreased Interest 0 0  Down, Depressed, Hopeless 0 0  PHQ - 2 Score 0 0     Review of Systems  All other systems reviewed and are negative.      Objective:   Physical Exam  Constitutional: She is oriented to person, place, and time. She appears well-developed and well-nourished.  HENT:  Head: Normocephalic and atraumatic.  Eyes: Conjunctivae and EOM are normal. Pupils are equal, round, and reactive to light.  Neurological: She is alert and oriented to person, place, and time.  Psychiatric: She has a normal mood and affect.  Vitals reviewed.  Motor strength is 5/5 bilateral hip flexor knee extensor and ankle dorsiflexor Sensation intact in bilateral lower limbs. Knees have full range of motion there is mild crepitus on the right side and his son the left. No neck evidence of knee effusion.  Gait is without an assisted device       Assessment & Plan:  1.  Lumbar spinal stenosis multilevel with radiculopathy- has responded to Right L4-5 ESI, Has similar symptoms on the left lower extremity. We'll set her up for bilateral L4-L5 transforaminal injections. We also discussed neurosurgical consultation to evaluate for decompressive surgery. She asked what type of surgery may be available we discussed both  fusion as well as a simple laminectomy or discectomy. That will of course would be up to the surgeon.  In the meantime we'll continue Tylenol with Codeine 1 tablet twice a day We discussed no alcohol while taking this medication. She is also taking Arthrotec 1 tablet twice a day  Return in 4 weeks for bilateral L4-L5 transforaminal injections Referral to Dr. Vertell Limber at [facility] to evaluate for lumbar decompression plus minus fusion

## 2015-09-22 LAB — 6-ACETYLMORPHINE,TOXASSURE ADD
6-ACETYLMORPHINE: NEGATIVE
6-acetylmorphine: NOT DETECTED ng/mg creat

## 2015-09-22 LAB — TOXASSURE SELECT,+ANTIDEPR,UR: PDF: 0

## 2015-09-24 NOTE — Progress Notes (Addendum)
Urine drug screen is positive for alcohol.  This is the second positive UDS for alcohol. She is positive for the metabolites of Tylenol #4 but not the parent drug codeine . Per Dr Letta Pate she is to be discharged.

## 2015-09-26 ENCOUNTER — Telehealth: Payer: Self-pay | Admitting: *Deleted

## 2015-09-26 NOTE — Telephone Encounter (Addendum)
Per Dr Letta Pate she is to be discharged.  A letter will be mailed with area pain clinics.

## 2015-09-26 NOTE — Telephone Encounter (Signed)
-----   Message from Charlett Blake, MD sent at 09/24/2015  4:43 PM EST ----- Second UDS positive for alcohol, no further prescriptions for Tylenol with Codeine.

## 2015-10-15 ENCOUNTER — Ambulatory Visit: Payer: PPO | Admitting: Physical Medicine & Rehabilitation

## 2015-10-15 DIAGNOSIS — H538 Other visual disturbances: Secondary | ICD-10-CM | POA: Diagnosis not present

## 2015-10-15 DIAGNOSIS — G894 Chronic pain syndrome: Secondary | ICD-10-CM | POA: Diagnosis not present

## 2015-10-15 DIAGNOSIS — I119 Hypertensive heart disease without heart failure: Secondary | ICD-10-CM | POA: Diagnosis not present

## 2015-10-15 DIAGNOSIS — Z0000 Encounter for general adult medical examination without abnormal findings: Secondary | ICD-10-CM | POA: Diagnosis not present

## 2015-10-15 DIAGNOSIS — E785 Hyperlipidemia, unspecified: Secondary | ICD-10-CM | POA: Diagnosis not present

## 2015-10-15 DIAGNOSIS — G4733 Obstructive sleep apnea (adult) (pediatric): Secondary | ICD-10-CM | POA: Diagnosis not present

## 2015-10-15 DIAGNOSIS — I10 Essential (primary) hypertension: Secondary | ICD-10-CM | POA: Diagnosis not present

## 2015-10-15 DIAGNOSIS — Z01118 Encounter for examination of ears and hearing with other abnormal findings: Secondary | ICD-10-CM | POA: Diagnosis not present

## 2015-10-15 DIAGNOSIS — K219 Gastro-esophageal reflux disease without esophagitis: Secondary | ICD-10-CM | POA: Diagnosis not present

## 2015-10-15 DIAGNOSIS — E6609 Other obesity due to excess calories: Secondary | ICD-10-CM | POA: Diagnosis not present

## 2015-10-15 DIAGNOSIS — J45909 Unspecified asthma, uncomplicated: Secondary | ICD-10-CM | POA: Diagnosis not present

## 2015-10-15 DIAGNOSIS — E119 Type 2 diabetes mellitus without complications: Secondary | ICD-10-CM | POA: Diagnosis not present

## 2015-10-28 DIAGNOSIS — I10 Essential (primary) hypertension: Secondary | ICD-10-CM | POA: Diagnosis not present

## 2015-10-28 DIAGNOSIS — E559 Vitamin D deficiency, unspecified: Secondary | ICD-10-CM | POA: Diagnosis not present

## 2015-11-01 DIAGNOSIS — E559 Vitamin D deficiency, unspecified: Secondary | ICD-10-CM | POA: Diagnosis not present

## 2015-11-01 DIAGNOSIS — I10 Essential (primary) hypertension: Secondary | ICD-10-CM | POA: Diagnosis not present

## 2015-11-15 DIAGNOSIS — I10 Essential (primary) hypertension: Secondary | ICD-10-CM | POA: Diagnosis not present

## 2015-11-15 DIAGNOSIS — J45909 Unspecified asthma, uncomplicated: Secondary | ICD-10-CM | POA: Diagnosis not present

## 2015-11-15 DIAGNOSIS — I119 Hypertensive heart disease without heart failure: Secondary | ICD-10-CM | POA: Diagnosis not present

## 2015-11-15 DIAGNOSIS — E119 Type 2 diabetes mellitus without complications: Secondary | ICD-10-CM | POA: Diagnosis not present

## 2015-11-15 DIAGNOSIS — G894 Chronic pain syndrome: Secondary | ICD-10-CM | POA: Diagnosis not present

## 2015-11-15 DIAGNOSIS — G4733 Obstructive sleep apnea (adult) (pediatric): Secondary | ICD-10-CM | POA: Diagnosis not present

## 2015-11-15 DIAGNOSIS — E785 Hyperlipidemia, unspecified: Secondary | ICD-10-CM | POA: Diagnosis not present

## 2015-11-20 DIAGNOSIS — G894 Chronic pain syndrome: Secondary | ICD-10-CM | POA: Diagnosis not present

## 2015-11-20 DIAGNOSIS — M545 Low back pain: Secondary | ICD-10-CM | POA: Diagnosis not present

## 2015-12-12 DIAGNOSIS — I10 Essential (primary) hypertension: Secondary | ICD-10-CM | POA: Diagnosis not present

## 2015-12-12 DIAGNOSIS — E559 Vitamin D deficiency, unspecified: Secondary | ICD-10-CM | POA: Diagnosis not present

## 2015-12-13 ENCOUNTER — Other Ambulatory Visit: Payer: Self-pay | Admitting: Physical Medicine & Rehabilitation

## 2015-12-16 ENCOUNTER — Other Ambulatory Visit: Payer: Self-pay | Admitting: Physical Medicine & Rehabilitation

## 2015-12-17 DIAGNOSIS — I119 Hypertensive heart disease without heart failure: Secondary | ICD-10-CM | POA: Diagnosis not present

## 2015-12-17 DIAGNOSIS — G894 Chronic pain syndrome: Secondary | ICD-10-CM | POA: Diagnosis not present

## 2015-12-17 DIAGNOSIS — Z0001 Encounter for general adult medical examination with abnormal findings: Secondary | ICD-10-CM | POA: Diagnosis not present

## 2015-12-17 DIAGNOSIS — E785 Hyperlipidemia, unspecified: Secondary | ICD-10-CM | POA: Diagnosis not present

## 2015-12-17 DIAGNOSIS — E559 Vitamin D deficiency, unspecified: Secondary | ICD-10-CM | POA: Diagnosis not present

## 2015-12-17 DIAGNOSIS — E119 Type 2 diabetes mellitus without complications: Secondary | ICD-10-CM | POA: Diagnosis not present

## 2015-12-18 DIAGNOSIS — G894 Chronic pain syndrome: Secondary | ICD-10-CM | POA: Diagnosis not present

## 2015-12-26 DIAGNOSIS — E083319 Diabetes mellitus due to underlying condition with moderate nonproliferative diabetic retinopathy with macular edema, unspecified eye: Secondary | ICD-10-CM | POA: Diagnosis not present

## 2015-12-26 DIAGNOSIS — E119 Type 2 diabetes mellitus without complications: Secondary | ICD-10-CM | POA: Diagnosis not present

## 2015-12-26 DIAGNOSIS — E113499 Type 2 diabetes mellitus with severe nonproliferative diabetic retinopathy without macular edema, unspecified eye: Secondary | ICD-10-CM | POA: Diagnosis not present

## 2016-01-07 DIAGNOSIS — I119 Hypertensive heart disease without heart failure: Secondary | ICD-10-CM | POA: Diagnosis not present

## 2016-01-07 DIAGNOSIS — E785 Hyperlipidemia, unspecified: Secondary | ICD-10-CM | POA: Diagnosis not present

## 2016-01-07 DIAGNOSIS — I10 Essential (primary) hypertension: Secondary | ICD-10-CM | POA: Diagnosis not present

## 2016-01-07 DIAGNOSIS — E119 Type 2 diabetes mellitus without complications: Secondary | ICD-10-CM | POA: Diagnosis not present

## 2016-01-07 DIAGNOSIS — E559 Vitamin D deficiency, unspecified: Secondary | ICD-10-CM | POA: Diagnosis not present

## 2016-01-07 DIAGNOSIS — G894 Chronic pain syndrome: Secondary | ICD-10-CM | POA: Diagnosis not present

## 2016-01-08 DIAGNOSIS — N6001 Solitary cyst of right breast: Secondary | ICD-10-CM | POA: Diagnosis not present

## 2016-01-08 DIAGNOSIS — N6459 Other signs and symptoms in breast: Secondary | ICD-10-CM | POA: Diagnosis not present

## 2016-01-09 DIAGNOSIS — I10 Essential (primary) hypertension: Secondary | ICD-10-CM | POA: Diagnosis not present

## 2016-01-09 DIAGNOSIS — E559 Vitamin D deficiency, unspecified: Secondary | ICD-10-CM | POA: Diagnosis not present

## 2016-01-27 ENCOUNTER — Encounter (HOSPITAL_COMMUNITY): Payer: Self-pay | Admitting: *Deleted

## 2016-01-27 ENCOUNTER — Emergency Department (HOSPITAL_COMMUNITY)
Admission: EM | Admit: 2016-01-27 | Discharge: 2016-01-27 | Disposition: A | Discharge status: Home / Self Care | Payer: PPO | Attending: Emergency Medicine | Admitting: Emergency Medicine

## 2016-01-27 DIAGNOSIS — E119 Type 2 diabetes mellitus without complications: Secondary | ICD-10-CM | POA: Diagnosis not present

## 2016-01-27 DIAGNOSIS — Z79899 Other long term (current) drug therapy: Secondary | ICD-10-CM | POA: Insufficient documentation

## 2016-01-27 DIAGNOSIS — Z7952 Long term (current) use of systemic steroids: Secondary | ICD-10-CM | POA: Insufficient documentation

## 2016-01-27 DIAGNOSIS — K0889 Other specified disorders of teeth and supporting structures: Secondary | ICD-10-CM | POA: Diagnosis not present

## 2016-01-27 DIAGNOSIS — Z7984 Long term (current) use of oral hypoglycemic drugs: Secondary | ICD-10-CM | POA: Insufficient documentation

## 2016-01-27 DIAGNOSIS — Z7982 Long term (current) use of aspirin: Secondary | ICD-10-CM | POA: Insufficient documentation

## 2016-01-27 DIAGNOSIS — K002 Abnormalities of size and form of teeth: Secondary | ICD-10-CM | POA: Insufficient documentation

## 2016-01-27 DIAGNOSIS — Z8709 Personal history of other diseases of the respiratory system: Secondary | ICD-10-CM | POA: Insufficient documentation

## 2016-01-27 DIAGNOSIS — I10 Essential (primary) hypertension: Secondary | ICD-10-CM | POA: Diagnosis not present

## 2016-01-27 DIAGNOSIS — Z794 Long term (current) use of insulin: Secondary | ICD-10-CM | POA: Diagnosis not present

## 2016-01-27 DIAGNOSIS — Z791 Long term (current) use of non-steroidal anti-inflammatories (NSAID): Secondary | ICD-10-CM | POA: Diagnosis not present

## 2016-01-27 DIAGNOSIS — K029 Dental caries, unspecified: Secondary | ICD-10-CM | POA: Insufficient documentation

## 2016-01-27 MED ORDER — PENICILLIN V POTASSIUM 500 MG PO TABS: 500.0000 mg | ORAL_TABLET | Freq: Four times a day (QID) | ORAL | Status: AC

## 2016-01-27 MED ORDER — HYDROCODONE-ACETAMINOPHEN 5-325 MG PO TABS: 1.0000 | ORAL_TABLET | Freq: Once | ORAL | Status: DC

## 2016-01-27 MED ORDER — OXYCODONE-ACETAMINOPHEN 5-325 MG PO TABS: 1.0000 | ORAL_TABLET | Freq: Three times a day (TID) | ORAL | Status: DC | PRN

## 2016-01-27 NOTE — ED Provider Notes (Signed)
CSN: [id number]     Arrival date & time 01/27/16  0451 History   First MD Initiated Contact with Patient 01/27/16 [phone]     Chief Complaint  Patient presents with  . Dental Pain   (Consider location/radiation/quality/duration/timing/severity/associated sxs/prior Treatment) HPI 70 y.o. female presents to the Emergency Department today complaining of right lower dental pain since this weekend. No aggravating factors/trauma. States that the pain is throbbing and 10/10. Given analgesia in ED and pain has subsided. Notes no fevers. States that she had a filling placed on her tooth several years ago and states that it is "acting up again." Has full ROM of neck. Able to tolerate PO. Able to speak without difficulty. No other symptoms noted.   Past Medical History  Diagnosis Date  . Diabetes mellitus   . Hypertension   . Allergic rhinitis   . Hyperlipidemia    Past Surgical History  Procedure Laterality Date  . Joint replacement     Family History  Problem Relation Age of Onset  . Prostate cancer Father    Social History  Substance Use Topics  . Smoking status: Never Smoker   . Smokeless tobacco: Never Used  . Alcohol Use: No   OB History    No data available     Review of Systems  Constitutional: Negative for fever.  HENT: Positive for dental problem. Negative for sore throat and trouble swallowing.   Musculoskeletal: Negative for neck pain and neck stiffness.   Allergies  Aspirin; Hydrochlorothiazide; Ibuprofen; and Tramadol  Home Medications   Prior to Admission medications   Medication Sig Start Date End Date Taking? Authorizing Provider  acetaminophen-codeine (TYLENOL #4) 300-60 MG tablet Take 1 tablet by mouth 2 (two) times daily as needed for moderate pain. 09/16/15   Charlett Blake, MD  albuterol (PROVENTIL HFA;VENTOLIN HFA) 108 (90 BASE) MCG/ACT inhaler Inhale 2 puffs into the lungs every 6 (six) hours as needed for wheezing.    Historical Provider, MD  aspirin EC  81 MG tablet Take 81 mg by mouth daily.    Historical Provider, MD  Azelastine-Fluticasone (DYMISTA) 137-50 MCG/ACT SUSP Place 2 sprays into both nostrils at bedtime. 06/26/13   Deneise Lever, MD  diclofenac (VOLTAREN) 75 MG EC tablet TAKE 1 TABLET BY MOUTH TWICE DAILY 09/16/15   Charlett Blake, MD  Diclofenac-Misoprostol (ARTHROTEC) 75-0.2 MG TBEC Take 1 tablet by mouth 2 (two) times daily. 09/16/15   Charlett Blake, MD  Docusate Calcium (STOOL SOFTENER PO) Take 1 tablet by mouth as directed.    Historical Provider, MD  exenatide (BYETTA) 5 MCG/0.02ML SOPN injection Inject 5 mcg into the skin 2 (two) times daily with a meal.    Historical Provider, MD  famotidine (PEPCID) 20 MG tablet Take 20 mg by mouth 2 (two) times daily.    Historical Provider, MD  gabapentin (NEURONTIN) 300 MG capsule TAKE 1 CAPSULE(300 MG) BY MOUTH THREE TIMES DAILY 08/14/15   Charlett Blake, MD  insulin aspart protamine-insulin aspart (NOVOLOG 70/30) (70-30) 100 UNIT/ML injection Inject 40 Units into the skin 2 (two) times daily.     Historical Provider, MD  lisinopril-hydrochlorothiazide (PRINZIDE,ZESTORETIC) 20-25 MG per tablet Take 1 tablet by mouth daily.    Historical Provider, MD  magnesium citrate SOLN Take 1 Bottle by mouth once.    Historical Provider, MD  metFORMIN (GLUCOPHAGE) 1000 MG tablet Take 1,000 mg by mouth 2 (two) times daily with a meal.    Historical Provider, MD  misoprostol (CYTOTEC)  200 MCG tablet TAKE 1 TABLET BY MOUTH TWICE DAILY 09/16/15   Charlett Blake, MD  mometasone (NASONEX) 50 MCG/ACT nasal spray Place 1 spray into the nose 2 (two) times daily.    Historical Provider, MD  tolterodine (DETROL) 2 MG tablet Take 0.5 tablets (1 mg total) by mouth 2 (two) times daily. 05/07/15   Charlett Blake, MD   BP 146/69 mmHg  Pulse 89  Temp(Src) 98.1 F (36.7 C) (Oral)  Resp 16  SpO2 97%   Physical Exam  Constitutional: She is oriented to person, place, and time. She appears  well-developed and well-nourished.  HENT:  Head: Normocephalic and atraumatic.  Mouth/Throat: Uvula is midline, oropharynx is clear and moist and mucous membranes are normal. She does not have dentures. No trismus in the jaw. Abnormal dentition. No dental abscesses, uvula swelling or dental caries. No oropharyngeal exudate, posterior oropharyngeal edema, posterior oropharyngeal erythema or tonsillar abscesses.    Pt without upper teeth (wears dentures). Missing bilateral bottom molars. No Trismus. No Ludwig's Angina. Full ROM of neck without pain. No signs of infection/erythema.    Eyes: EOM are normal.  Cardiovascular: Normal rate and regular rhythm.   Pulmonary/Chest: Effort normal.  Abdominal: Soft.  Musculoskeletal: Normal range of motion.  Neurological: She is alert and oriented to person, place, and time.  Skin: Skin is warm and dry.  Psychiatric: She has a normal mood and affect. Her behavior is normal. Thought content normal.  Nursing note and vitals reviewed.  ED Course  Procedures (including critical care time) Labs Review Labs Reviewed - No data to display  Imaging Review No results found. I have personally reviewed and evaluated these images and lab results as part of my medical decision-making.   EKG Interpretation None      MDM  I have reviewed the relevant previous healthcare records. I obtained HPI from historian.  ED Course:  Assessment: Dental pain associated with dental cary but no signs or symptoms of dental abscess with patient afebrile, non toxic appearing and swallowing secretions well. Exam unconcerning for Ludwig's angina or other deep tissue infection in neck. Given Rx Penicillin. Will treat with pain medication. Rx Percocet #5  I gave patient referral to dentist and stressed the importance of dental follow up for ultimate management of dental pain. Patient voices understanding and is agreeable to plan.  Disposition/Plan:  DC Home Additional Verbal  discharge instructions given and discussed with patient.  Pt Instructed to f/u with Dentist in the next week for evaluation and treatment of symptoms. Return precautions given Pt acknowledges and agrees with plan  Supervising Physician April Palumbo, MD   Final diagnoses:  Pain, dental    Shary Decamp, PA-C 01/27/16 [id number]  April Palumbo, MD 01/27/16 [phone]

## 2016-01-27 NOTE — ED Notes (Signed)
Patient presents with c/o dental pain to the right bottom but also hurts to put in her top dentures

## 2016-01-27 NOTE — ED Notes (Signed)
See EDP assessment 

## 2016-01-27 NOTE — Discharge Instructions (Signed)
Please read and follow all provided instructions.  Your diagnoses today include:  1. Pain, dental    Tests performed today include:  Vital signs. See below for your results today.   Medications prescribed:   Take as prescribed   Home care instructions:  Follow any educational materials contained in this packet.  Follow-up instructions: Please follow-up with a dentist (see below) for further evaluation of symptoms and treatment   Return instructions:   Please return to the Emergency Department if you do not get better, if you get worse, or new symptoms OR  - Fever (temperature greater than 101.71F)  - Bleeding that does not stop with holding pressure to the area    -Severe pain (please note that you may be more sore the day after your accident)  - Chest Pain  - Difficulty breathing  - Severe nausea or vomiting  - Inability to tolerate food and liquids  - Passing out  - Skin becoming red around your wounds  - Change in mental status (confusion or lethargy)  - New numbness or weakness     Please return if you have any other emergent concerns.  Additional Information:  Your vital signs today were: BP 146/69 mmHg   Pulse 89   Temp(Src) 98.1 F (36.7 C) (Oral)   Resp 16   SpO2 97% If your blood pressure (BP) was elevated above 135/85 this visit, please have this repeated by your doctor within one month. ---------------   [address] is a great source of information about community services available.  Access by dialing 2-1-1 from anywhere in [place], or by website -  [employer].   Other Local Resources (Updated 09/2015)  Dental  Care   Services    Phone Number and Address  Cost  [facility] For children 18 - 43 years of age:   Cleaning  Tooth brushing/flossing instruction  Sealants, fillings, crowns  Extractions  Emergency treatment  [phone] [address], [place] [address] Charges based on family income.  Medicaid and some insurance plans accepted.     [facility] - [facility], fillings, crowns  Extractions  Emergency treatment [phone] [address], [place]  Pregnant women 63 years of age or older with a Medicaid card  [facility] - [place]  Cleaning  Sealants, fillings, crowns  Extractions  Emergency treatment [phone] [address] [place], [place] Pregnant women 68 years of age or older with a Medicaid card  [facility] For children 69 - 24 years of age:   Cleaning  Tooth brushing/flossing instruction  Sealants, fillings, crowns  Extractions  Emergency treatment Limited orthodontic services for patients with Medicaid [phone], [place] [address] Medicaid and [facility] Health Choice cover for children up to age 46 and pregnant women.  Parents of children up to age 30 without Medicaid pay a reduced fee at time of service.  [facility] For children 18 - 53 years of age:   Cleaning  Tooth brushing/flossing instruction  Sealants, fillings, crowns  Extractions  Emergency treatment Limited orthodontic services for patients with Medicaid [phone] [place], [place].  Medicaid and  Health Choice cover for children up to age 50 and pregnant women.  Parents of children up to age 71  without Medicaid pay a reduced fee.  [facility]  Sealants, fillings, crowns  Extractions  Hours: Tuesdays and Thursdays, 4:15 - 8 pm [phone] [address] [place], [place] [address] Services free of charge to [facility] residents ages 18-64 who do not have health insurance, Medicare, [place], or [place] benefits and fall within  federal poverty guidelines  [place] care in addition to primary medical care, nutritional counseling, and pharmacy:  Engineer, drilling, fillings, crowns  Extractions                  [phone] [facility], [place], [place], [place], [place], [place] [facility], [place], [facility] [place], [place], [place], most insurance.  Also provides services available to all with fees adjusted based on ability to pay.    [facility]  Cleaning  Tooth brushing/flossing instruction  Sealants, fillings, crowns  Extractions  Emergency treatment Hours: Tuesdays, Thursdays, and Fridays from 8 am to 5 pm by appointment only. [phone] [address] [place], [place] [address] [facility] residents with Medicaid (depending on eligibility) and children with [facility] Health Choice - call for more information.  [facility]  Extractions only  Hours: 2nd and 4th Thursday of each month from 6:30 am - 9 am.   [phone] ext. [place], [place] [address] Ages 61 and older only.  Patients are seen on a first come, first served basis.  [employer]  [employer]  Extractions  Orthodontics  Endodontics  Implants/Crowns/Bridges  Complete and partial dentures [phone] [place], [place] Patients must complete an application for services.  There is often a waiting list.

## 2016-01-29 DIAGNOSIS — M069 Rheumatoid arthritis, unspecified: Secondary | ICD-10-CM | POA: Diagnosis not present

## 2016-01-29 DIAGNOSIS — G894 Chronic pain syndrome: Secondary | ICD-10-CM | POA: Diagnosis not present

## 2016-02-05 DIAGNOSIS — E559 Vitamin D deficiency, unspecified: Secondary | ICD-10-CM | POA: Diagnosis not present

## 2016-02-05 DIAGNOSIS — I10 Essential (primary) hypertension: Secondary | ICD-10-CM | POA: Diagnosis not present

## 2016-02-26 DIAGNOSIS — M069 Rheumatoid arthritis, unspecified: Secondary | ICD-10-CM | POA: Diagnosis not present

## 2016-02-26 DIAGNOSIS — G894 Chronic pain syndrome: Secondary | ICD-10-CM | POA: Diagnosis not present

## 2016-03-04 DIAGNOSIS — G894 Chronic pain syndrome: Secondary | ICD-10-CM | POA: Diagnosis not present

## 2016-03-04 DIAGNOSIS — E119 Type 2 diabetes mellitus without complications: Secondary | ICD-10-CM | POA: Diagnosis not present

## 2016-03-04 DIAGNOSIS — I10 Essential (primary) hypertension: Secondary | ICD-10-CM | POA: Diagnosis not present

## 2016-03-04 DIAGNOSIS — E785 Hyperlipidemia, unspecified: Secondary | ICD-10-CM | POA: Diagnosis not present

## 2016-03-04 DIAGNOSIS — I119 Hypertensive heart disease without heart failure: Secondary | ICD-10-CM | POA: Diagnosis not present

## 2016-03-04 DIAGNOSIS — E559 Vitamin D deficiency, unspecified: Secondary | ICD-10-CM | POA: Diagnosis not present

## 2016-03-10 DIAGNOSIS — E119 Type 2 diabetes mellitus without complications: Secondary | ICD-10-CM | POA: Diagnosis not present

## 2016-03-10 DIAGNOSIS — G894 Chronic pain syndrome: Secondary | ICD-10-CM | POA: Diagnosis not present

## 2016-03-10 DIAGNOSIS — I119 Hypertensive heart disease without heart failure: Secondary | ICD-10-CM | POA: Diagnosis not present

## 2016-03-10 DIAGNOSIS — I10 Essential (primary) hypertension: Secondary | ICD-10-CM | POA: Diagnosis not present

## 2016-03-10 DIAGNOSIS — E559 Vitamin D deficiency, unspecified: Secondary | ICD-10-CM | POA: Diagnosis not present

## 2016-03-10 DIAGNOSIS — E785 Hyperlipidemia, unspecified: Secondary | ICD-10-CM | POA: Diagnosis not present

## 2016-03-12 DIAGNOSIS — I10 Essential (primary) hypertension: Secondary | ICD-10-CM | POA: Diagnosis not present

## 2016-03-12 DIAGNOSIS — E559 Vitamin D deficiency, unspecified: Secondary | ICD-10-CM | POA: Diagnosis not present

## 2016-03-18 ENCOUNTER — Encounter: Payer: Self-pay | Admitting: Internal Medicine

## 2016-04-01 DIAGNOSIS — M25519 Pain in unspecified shoulder: Secondary | ICD-10-CM | POA: Diagnosis not present

## 2016-04-01 DIAGNOSIS — M545 Low back pain: Secondary | ICD-10-CM | POA: Diagnosis not present

## 2016-04-09 ENCOUNTER — Other Ambulatory Visit: Payer: Self-pay | Admitting: Obstetrics & Gynecology

## 2016-04-09 DIAGNOSIS — Z6841 Body Mass Index (BMI) 40.0 and over, adult: Secondary | ICD-10-CM | POA: Diagnosis not present

## 2016-04-09 DIAGNOSIS — Z124 Encounter for screening for malignant neoplasm of cervix: Secondary | ICD-10-CM | POA: Diagnosis not present

## 2016-04-09 DIAGNOSIS — Z01419 Encounter for gynecological examination (general) (routine) without abnormal findings: Secondary | ICD-10-CM | POA: Diagnosis not present

## 2016-04-10 DIAGNOSIS — E785 Hyperlipidemia, unspecified: Secondary | ICD-10-CM | POA: Diagnosis not present

## 2016-04-10 DIAGNOSIS — J302 Other seasonal allergic rhinitis: Secondary | ICD-10-CM | POA: Diagnosis not present

## 2016-04-10 DIAGNOSIS — I10 Essential (primary) hypertension: Secondary | ICD-10-CM | POA: Diagnosis not present

## 2016-04-10 DIAGNOSIS — E559 Vitamin D deficiency, unspecified: Secondary | ICD-10-CM | POA: Diagnosis not present

## 2016-04-10 DIAGNOSIS — I119 Hypertensive heart disease without heart failure: Secondary | ICD-10-CM | POA: Diagnosis not present

## 2016-04-10 DIAGNOSIS — E119 Type 2 diabetes mellitus without complications: Secondary | ICD-10-CM | POA: Diagnosis not present

## 2016-04-10 DIAGNOSIS — G894 Chronic pain syndrome: Secondary | ICD-10-CM | POA: Diagnosis not present

## 2016-04-10 LAB — CYTOLOGY - PAP

## 2016-04-13 DIAGNOSIS — E559 Vitamin D deficiency, unspecified: Secondary | ICD-10-CM | POA: Diagnosis not present

## 2016-04-13 DIAGNOSIS — I10 Essential (primary) hypertension: Secondary | ICD-10-CM | POA: Diagnosis not present

## 2016-04-15 DIAGNOSIS — I10 Essential (primary) hypertension: Secondary | ICD-10-CM | POA: Diagnosis not present

## 2016-04-15 DIAGNOSIS — K219 Gastro-esophageal reflux disease without esophagitis: Secondary | ICD-10-CM | POA: Diagnosis not present

## 2016-04-15 DIAGNOSIS — Z86010 Personal history of colonic polyps: Secondary | ICD-10-CM | POA: Diagnosis not present

## 2016-04-27 DIAGNOSIS — G894 Chronic pain syndrome: Secondary | ICD-10-CM | POA: Diagnosis not present

## 2016-04-27 DIAGNOSIS — M069 Rheumatoid arthritis, unspecified: Secondary | ICD-10-CM | POA: Diagnosis not present

## 2016-05-07 DIAGNOSIS — D123 Benign neoplasm of transverse colon: Secondary | ICD-10-CM | POA: Diagnosis not present

## 2016-05-07 DIAGNOSIS — K635 Polyp of colon: Secondary | ICD-10-CM | POA: Diagnosis not present

## 2016-05-07 DIAGNOSIS — D125 Benign neoplasm of sigmoid colon: Secondary | ICD-10-CM | POA: Diagnosis not present

## 2016-05-07 DIAGNOSIS — K5730 Diverticulosis of large intestine without perforation or abscess without bleeding: Secondary | ICD-10-CM | POA: Diagnosis not present

## 2016-05-07 DIAGNOSIS — Z1211 Encounter for screening for malignant neoplasm of colon: Secondary | ICD-10-CM | POA: Diagnosis not present

## 2016-05-07 DIAGNOSIS — Z86010 Personal history of colonic polyps: Secondary | ICD-10-CM | POA: Diagnosis not present

## 2016-05-16 ENCOUNTER — Emergency Department (HOSPITAL_COMMUNITY): Payer: PPO

## 2016-05-16 ENCOUNTER — Emergency Department (HOSPITAL_COMMUNITY)
Admission: EM | Admit: 2016-05-16 | Discharge: 2016-05-17 | Disposition: A | Discharge status: Home / Self Care | Payer: PPO | Attending: Emergency Medicine | Admitting: Emergency Medicine

## 2016-05-16 ENCOUNTER — Encounter (HOSPITAL_COMMUNITY): Payer: Self-pay

## 2016-05-16 DIAGNOSIS — E11319 Type 2 diabetes mellitus with unspecified diabetic retinopathy without macular edema: Secondary | ICD-10-CM | POA: Insufficient documentation

## 2016-05-16 DIAGNOSIS — J45909 Unspecified asthma, uncomplicated: Secondary | ICD-10-CM | POA: Diagnosis not present

## 2016-05-16 DIAGNOSIS — Z7984 Long term (current) use of oral hypoglycemic drugs: Secondary | ICD-10-CM | POA: Diagnosis not present

## 2016-05-16 DIAGNOSIS — Z794 Long term (current) use of insulin: Secondary | ICD-10-CM | POA: Diagnosis not present

## 2016-05-16 DIAGNOSIS — M545 Low back pain: Secondary | ICD-10-CM | POA: Diagnosis not present

## 2016-05-16 DIAGNOSIS — Z91040 Latex allergy status: Secondary | ICD-10-CM | POA: Diagnosis not present

## 2016-05-16 DIAGNOSIS — M5432 Sciatica, left side: Secondary | ICD-10-CM | POA: Diagnosis not present

## 2016-05-16 DIAGNOSIS — R079 Chest pain, unspecified: Secondary | ICD-10-CM

## 2016-05-16 DIAGNOSIS — R1084 Generalized abdominal pain: Secondary | ICD-10-CM | POA: Diagnosis not present

## 2016-05-16 DIAGNOSIS — Z7982 Long term (current) use of aspirin: Secondary | ICD-10-CM | POA: Diagnosis not present

## 2016-05-16 DIAGNOSIS — R1013 Epigastric pain: Secondary | ICD-10-CM

## 2016-05-16 DIAGNOSIS — E1140 Type 2 diabetes mellitus with diabetic neuropathy, unspecified: Secondary | ICD-10-CM | POA: Insufficient documentation

## 2016-05-16 DIAGNOSIS — M5442 Lumbago with sciatica, left side: Secondary | ICD-10-CM | POA: Diagnosis not present

## 2016-05-16 DIAGNOSIS — R0602 Shortness of breath: Secondary | ICD-10-CM | POA: Diagnosis not present

## 2016-05-16 DIAGNOSIS — R0789 Other chest pain: Secondary | ICD-10-CM | POA: Insufficient documentation

## 2016-05-16 LAB — CBC
HCT: 36.2 % (ref 36.0–46.0)
Hemoglobin: 11.4 g/dL — ABNORMAL LOW (ref 12.0–15.0)
MCH: 26.3 pg (ref 26.0–34.0)
MCHC: 31.5 g/dL (ref 30.0–36.0)
MCV: 83.4 fL (ref 78.0–100.0)
Platelets: 277 10*3/uL (ref 150–400)
RBC: 4.34 MIL/uL (ref 3.87–5.11)
RDW: 13.4 % (ref 11.5–15.5)
WBC: 7.6 10*3/uL (ref 4.0–10.5)

## 2016-05-16 LAB — HEPATIC FUNCTION PANEL
ALT: 16 U/L (ref 14–54)
AST: 19 U/L (ref 15–41)
Albumin: 3.7 g/dL (ref 3.5–5.0)
Alkaline Phosphatase: 83 U/L (ref 38–126)
Bilirubin, Direct: <0.1 mg/dL — ABNORMAL LOW (ref 0.1–0.5)
Total Bilirubin: 0.3 mg/dL (ref 0.3–1.2)
Total Protein: 7.6 g/dL (ref 6.5–8.1)

## 2016-05-16 LAB — BASIC METABOLIC PANEL
Anion gap: 9 (ref 5–15)
BUN: 16 mg/dL (ref 6–20)
CO2: 23 mmol/L (ref 22–32)
Calcium: 11.1 mg/dL — ABNORMAL HIGH (ref 8.9–10.3)
Chloride: 104 mmol/L (ref 101–111)
Creatinine, Ser: 0.86 mg/dL (ref 0.44–1.00)
GFR calc Af Amer: >60 mL/min (ref >60)
GFR calc non Af Amer: >60 mL/min (ref >60)
Glucose, Bld: 376 mg/dL — ABNORMAL HIGH (ref 65–99)
Potassium: 4.3 mmol/L (ref 3.5–5.1)
Sodium: 136 mmol/L (ref 135–145)

## 2016-05-16 LAB — LIPASE, BLOOD: Lipase: 27 U/L (ref 11–51)

## 2016-05-16 LAB — I-STAT TROPONIN, ED: Troponin i, poc: 0 ng/mL (ref 0.00–0.08)

## 2016-05-16 MED ORDER — MORPHINE SULFATE (PF) 4 MG/ML IV SOLN
4.0000 mg | Freq: Once | INTRAVENOUS | Status: AC
  Administered 2016-05-16: 4 mg via INTRAVENOUS
  Filled 2016-05-16: qty 1

## 2016-05-16 MED ORDER — IOPAMIDOL (ISOVUE-370) INJECTION 76%
INTRAVENOUS | Status: AC
  Administered 2016-05-17: 100 mL
  Filled 2016-05-16: qty 100

## 2016-05-16 MED ORDER — SODIUM CHLORIDE 0.9 % IV BOLUS (SEPSIS)
500.0000 mL | Freq: Once | INTRAVENOUS | Status: AC
  Administered 2016-05-16: 500 mL via INTRAVENOUS

## 2016-05-16 NOTE — ED Notes (Signed)
Pt given water 

## 2016-05-16 NOTE — ED Triage Notes (Signed)
Patient complains of epigastric pain, lower back and bilateral leg pain for several days, states that the pain is worse with movement. Has never had abdominal involvement with her sciatica. No CP, no shortness of breath

## 2016-05-16 NOTE — ED Provider Notes (Signed)
[facility] DEPT Provider Note   CSN: [id number] Arrival date & time: 05/16/16  1659     History   Chief Complaint Chief Complaint  Patient presents with  . Back Pain  . Leg Pain    HPI Katelyn [name] is a 70 y.o. female with a hx of sciatica, asthma, diabetic neuropathy, IDDM, HTN, obesity, GERD presents to the Emergency Department complaining of gradual, persistent, progressively worsening anterior thigh and low back pain onset 3 days ago. Associated symptoms include LUQ abd pain described as cramping and radiates into the chest.  Also describes this as deep pressure.  Denies fever, chills, headache, neck pain, jaw pain, SOB, N/V/D, weakness, dizziness, syncope.  Pain is episodic; waxing and waning without resolving.    Nothing makes it better and nothing makes it worse.  Pt reports pain is not similar to previous episodes of GERD.     The history is provided by the patient and medical records. No language interpreter was used.    Past Medical History:  Diagnosis Date  . Allergic rhinitis   . Asthma   . Chronic pain syndrome   . Colon polyp   . Diabetes mellitus   . H. pylori infection   . Hyperlipidemia   . Hyperparathyroidism ([place])   . Hypertension   . Hypertensive heart disease without heart failure   . Morbid obesity ([place])   . Sleep apnea   . Vitamin D deficiency     Patient Active Problem List   Diagnosis Date Noted  . Diabetic neuropathy ([place]) 05/07/2015  . Achilles tendinitis of left lower extremity 03/30/2014  . Spinal stenosis, lumbar region, with neurogenic claudication 02/16/2014  . Other chronic postoperative pain 02/16/2014  . Bilateral knee contractures 02/16/2014  . Obstructive sleep apnea 06/26/2013  . TRIGGER FINGER 04/29/2010  . Allergic rhinitis, cause unspecified 03/24/2010  . OSTEOARTHRITIS 02/10/2010  . DENTAL CARIES 09/24/2009  . DIABETIC  RETINOPATHY 04/29/2009  . ONYCHOMYCOSIS, TOENAILS 04/08/2009  . DYSLIPIDEMIA 04/08/2009  .  EDEMA 04/08/2009  . HYPERTENSION 12/24/2008  . ELECTROCARDIOGRAM, ABNORMAL 12/17/2008  . SCIATICA, RIGHT 07/11/2007  . Type II or unspecified type diabetes mellitus without mention of complication, not stated as uncontrolled 04/11/2007  . ANEMIA DUE TO DIETARY IRON DEFICIENCY 04/11/2007  . HYPERTENSION, BENIGN ESSENTIAL 04/11/2007  . OSTEOARTHRITIS, GENERALIZED 04/11/2007    Past Surgical History:  Procedure Laterality Date  . JOINT REPLACEMENT      OB History    No data available       Home Medications    Prior to Admission medications   Medication Sig Start Date End Date Taking? Authorizing Provider  acetaminophen (TYLENOL) 500 MG tablet Take 1,000 mg by mouth 2 (two) times daily as needed for moderate pain.   Yes Historical Provider, MD  acetaminophen-codeine (TYLENOL #4) 300-60 MG tablet Take 1 tablet by mouth every 6 (six) hours as needed for pain.  04/27/16  Yes Historical Provider, MD  albuterol (PROVENTIL HFA;VENTOLIN HFA) 108 (90 BASE) MCG/ACT inhaler Inhale 2 puffs into the lungs daily as needed for wheezing.    Yes Historical Provider, MD  amLODipine (NORVASC) 5 MG tablet Take 5 mg by mouth daily.   Yes Historical Provider, MD  aspirin EC 81 MG tablet Take 81 mg by mouth every morning.    Yes Historical Provider, MD  Azelastine-Fluticasone (DYMISTA) 137-50 MCG/ACT SUSP Place 2 sprays into both nostrils at bedtime. Patient taking differently: Place 2 sprays into both nostrils at bedtime as needed (for sinus  congestion).  06/26/13  Yes Deneise Lever, MD  Diclofenac-Misoprostol 75-0.2 MG TBEC Take 1 tablet by mouth 2 (two) times daily.  05/09/16  Yes Historical Provider, MD  exenatide (BYETTA) 5 MCG/0.02ML SOPN injection Inject 5 mcg into the skin 2 (two) times daily with a meal.   Yes Historical Provider, MD  famotidine (PEPCID) 20 MG tablet Take 20 mg by mouth 2 (two) times daily.   Yes Historical Provider, MD  gabapentin (NEURONTIN) 300 MG capsule TAKE 1 CAPSULE(300 MG)  BY MOUTH THREE TIMES DAILY Patient taking differently: TAKE 1 CAPSULE (300 MG) BY MOUTH THREE TIMES DAILY 08/14/15  Yes Charlett Blake, MD  insulin aspart protamine-insulin aspart (NOVOLOG 70/30) (70-30) 100 UNIT/ML injection Inject 50 Units into the skin 2 (two) times daily.    Yes Historical Provider, MD  lisinopril-hydrochlorothiazide (PRINZIDE,ZESTORETIC) 20-25 MG per tablet Take 1 tablet by mouth daily.   Yes Historical Provider, MD  metFORMIN (GLUCOPHAGE) 1000 MG tablet Take 1,000 mg by mouth 2 (two) times daily with a meal.   Yes Historical Provider, MD  mometasone (NASONEX) 50 MCG/ACT nasal spray Place 1 spray into the nose 2 (two) times daily.   Yes Historical Provider, MD  Multiple Vitamin (MULTIVITAMIN) tablet Take 1 tablet by mouth daily.   Yes Historical Provider, MD  oxyCODONE-acetaminophen (PERCOCET/ROXICET) 5-325 MG tablet Take 1 tablet by mouth every 8 (eight) hours as needed for severe pain. 01/27/16  Yes Shary Decamp, PA-C  methocarbamol (ROBAXIN) 500 MG tablet Take 1 tablet (500 mg total) by mouth 2 (two) times daily. 05/17/16   Delayna Sparlin, PA-C  misoprostol (CYTOTEC) 200 MCG tablet TAKE 1 TABLET BY MOUTH TWICE DAILY Patient not taking: Reported on 05/16/2016 09/16/15   Charlett Blake, MD  tolterodine (DETROL) 2 MG tablet Take 0.5 tablets (1 mg total) by mouth 2 (two) times daily. Patient not taking: Reported on 05/16/2016 05/07/15   Charlett Blake, MD    Family History Family History  Problem Relation Age of Onset  . Prostate cancer Father     Social History Social History  Substance Use Topics  . Smoking status: Never Smoker  . Smokeless tobacco: Never Used  . Alcohol use No     Allergies   Aspirin; Hydrochlorothiazide; Ibuprofen; Latex; and Tramadol   Review of Systems Review of Systems  Respiratory: Positive for chest tightness.   Cardiovascular: Positive for chest pain.  Gastrointestinal: Positive for abdominal pain.  Musculoskeletal:  Positive for arthralgias ( bilateral leg pain) and back pain.  All other systems reviewed and are negative.    Physical Exam Updated Vital Signs BP 130/76 (BP Location: Right Arm)   Pulse 89   Temp 97.9 F (36.6 C) (Oral)   Resp 16   Ht 5\' 5"  (1.651 m)   Wt 86.2 kg   SpO2 100%   BMI 31.62 kg/m   Physical Exam  Constitutional: She appears well-developed and well-nourished. No distress.  Awake, alert, nontoxic appearance  HENT:  Head: Normocephalic and atraumatic.  Mouth/Throat: Oropharynx is clear and moist. No oropharyngeal exudate.  Eyes: Conjunctivae are normal. No scleral icterus.  Neck: Normal range of motion. Neck supple.  Full ROM without pain  Cardiovascular: Normal rate, regular rhythm and intact distal pulses.   Pulmonary/Chest: Effort normal and breath sounds normal. No respiratory distress. She has no wheezes.  Equal chest expansion  Abdominal: Soft. Bowel sounds are normal. She exhibits no distension and no mass. There is no tenderness. There is no rebound and no  guarding.  Musculoskeletal: Normal range of motion. She exhibits no edema.  Full range of motion of the T-spine and L-spine Mild midline tenderness to the lower L-spine with tenderness to palpation of the left paraspinous muscles.  No TTP of the midline of T spine or paraspinal muscles Well healed incisions from previous bilateral knee arthroplasty  Lymphadenopathy:    She has no cervical adenopathy.  Neurological: She is alert. She has normal reflexes.  Reflex Scores:      Bicep reflexes are 2+ on the right side and 2+ on the left side.      Brachioradialis reflexes are 2+ on the right side and 2+ on the left side.      Patellar reflexes are 2+ on the right side and 2+ on the left side.      Achilles reflexes are 2+ on the right side and 2+ on the left side. Speech is clear and goal oriented, follows commands Normal 5/5 strength in upper and lower extremities bilaterally including dorsiflexion and  plantar flexion, strong and equal grip strength Sensation normal to light and sharp touch Moves extremities without ataxia, coordination intact Normal gait Normal balance No Clonus   Skin: Skin is warm and dry. No rash noted. She is not diaphoretic. No erythema.  Psychiatric: She has a normal mood and affect. Her behavior is normal.  Nursing note and vitals reviewed.    ED Treatments / Results  Labs (all labs ordered are listed, but only abnormal results are displayed) Labs Reviewed  BASIC METABOLIC PANEL - Abnormal; Notable for the following:       Result Value   Glucose, Bld 376 (*)    Calcium 11.1 (*)    All other components within normal limits  CBC - Abnormal; Notable for the following:    Hemoglobin 11.4 (*)    All other components within normal limits  HEPATIC FUNCTION PANEL - Abnormal; Notable for the following:    Bilirubin, Direct <0.1 (*)    All other components within normal limits  LIPASE, BLOOD  I-STAT TROPOININ, ED    EKG  EKG Interpretation  Date/Time:  Saturday May 16 2016 17:11:50 EDT Ventricular Rate:  102 PR Interval:  158 QRS Duration: 134 QT Interval:  376 QTC Calculation: 490 R Axis:   -93 Text Interpretation:  Sinus tachycardia Right bundle branch block Abnormal ECG No significant change Confirmed by Jeneen Rinks  MD, [place] ([id number]) on 05/17/2016 1:03:06 AM       Radiology Dg Chest Port 1 View  Result Date: 05/16/2016 CLINICAL DATA:  LEFT side chest pain, pain under LEFT breast, and shortness of breath for 3 days, history hypertension, diabetes mellitus EXAM: PORTABLE CHEST 1 VIEW COMPARISON:  Portable exam 2310 hours compared to 04/15/2011 FINDINGS: Borderline enlargement of cardiac silhouette. Mediastinal contours and pulmonary vascularity normal. Aortic atherosclerotic calcification. Lungs clear. No pleural effusion or pneumothorax. Bones unremarkable. IMPRESSION: No acute abnormalities. Aortic atherosclerosis. Electronically Signed   By: Lavonia Dana M.D.   On: 05/16/2016 23:18   Ct Angio Chest/abd/pel For Dissection W And/or Wo Contrast  Result Date: 05/17/2016 CLINICAL DATA:  Acute onset of generalized chest pain and tachycardia. Generalized abdominal pain and back pain. Initial encounter. EXAM: CT ANGIOGRAPHY CHEST, ABDOMEN AND PELVIS TECHNIQUE: Multidetector CT imaging through the chest, abdomen and pelvis was performed using the standard protocol during bolus administration of intravenous contrast. Multiplanar reconstructed images and MIPs were obtained and reviewed to evaluate the vascular anatomy. CONTRAST:  100 mL of Isovue 370  IV contrast COMPARISON:  None. FINDINGS: CTA CHEST FINDINGS There is no evidence of aortic dissection. There is no evidence of aneurysmal dilatation. Minimal calcification is noted along the distal aortic arch. The great vessels are unremarkable in appearance. There is no evidence of significant pulmonary embolus. Minimal hazy opacities are noted bilaterally at the lower lung zones, which may reflect atelectasis or possibly a mild infectious process. There is no evidence of pleural effusion or pneumothorax. No masses are identified; no abnormal focal contrast enhancement is seen. The mediastinum is unremarkable in appearance. No mediastinal lymphadenopathy is seen. No pericardial effusion is identified. No axillary lymphadenopathy is seen. The visualized portions of the thyroid gland are unremarkable in appearance. No acute osseous abnormalities are seen. Review of the MIP images confirms the above findings. CTA ABDOMEN AND PELVIS FINDINGS There is no evidence of aortic dissection. There is no evidence of aneurysmal dilatation. Minimal calcification is noted along the distal abdominal aorta. The celiac trunk, superior mesenteric artery, bilateral renal arteries and inferior mesenteric artery remain patent. The inferior vena cava is grossly unremarkable in appearance, though not fully assessed given the phase of contrast  enhancement. The liver and spleen are unremarkable in appearance. The gallbladder is within normal limits. The pancreas and adrenal glands are unremarkable. The kidneys are unremarkable in appearance. There is no evidence of hydronephrosis. No renal or ureteral stones are seen. Mild nonspecific perinephric stranding is noted bilaterally. No free fluid is identified. The small bowel is unremarkable in appearance. The stomach is within normal limits. No acute vascular abnormalities are seen. The appendix is normal in caliber, without evidence of appendicitis. The colon is grossly unremarkable in appearance. The bladder is mildly distended and grossly unremarkable. The uterus is unremarkable in appearance. The ovaries are relatively symmetric. No suspicious adnexal masses are seen. No inguinal lymphadenopathy is seen. No acute osseous abnormalities are identified. Vacuum phenomenon is noted at T11-T12. Review of the MIP images confirms the above findings. IMPRESSION: 1. No evidence of aortic dissection. No evidence of aneurysmal dilatation. Minimal calcific atherosclerotic disease noted. 2. No evidence of significant pulmonary embolus. 3. Minimal hazy opacities at both medial lower lung zones may reflect atelectasis or possibly a mild infectious process. Electronically Signed   By: Garald Balding M.D.   On: 05/17/2016 01:11    Procedures Procedures (including critical care time)  Medications Ordered in ED Medications  morphine 4 MG/ML injection 4 mg (4 mg Intravenous Given 05/16/16 2312)  sodium chloride 0.9 % bolus 500 mL (0 mLs Intravenous Stopped 05/17/16 0125)  iopamidol (ISOVUE-370) 76 % injection (100 mLs  Contrast Given 05/17/16 0003)  methocarbamol (ROBAXIN) tablet 500 mg (500 mg Oral Given 05/17/16 0145)  oxyCODONE (Oxy IR/ROXICODONE) immediate release tablet 5 mg (5 mg Oral Given 05/17/16 0145)     Initial Impression / Assessment and Plan / ED Course  I have reviewed the triage vital signs and the  nursing notes.  Pertinent labs & imaging results that were available during my care of the patient were reviewed by me and considered in my medical decision making (see chart for details).  Clinical Course  Value Comment By Time  Troponin i, poc: 0.00 Neg Cadyn Rodger, PA-C 09/16 2310  WBC: 7.6 No Leukocytosis; Mild anemia Abigail Butts, PA-C 09/16 2310  Creatinine: 0.86 Normal Luane Rochon, PA-C 09/16 2310  Glucose: (!) 376 Elevated with normal anion gap Abigail Butts, PA-C 09/16 2310  CT Angio Chest/Abd/Pel for Dissection W and/or Wo Contrast No evidence of dissection  or PE Abigail Butts, PA-C 09/17 0230   Patient is ambulatory here in the emergency department without difficulty. No gait disturbance. Jarrett Soho Laiyah Exline, PA-C 09/17 0230  BP: 120/69 Vital signs have remained stable Abigail Butts, PA-C 09/17 0231    Patient presents with back pain consistent with previous episodes of sciatica. It does radiate from her low back down her left leg however she is able to ambulate without gait disturbance. No neurologic deficits. She is also complaining of upper abdominal pain and chest pain. CT angio shows no evidence of PE or aortic dissection. Labs are reassuring. Glucose is elevated however she is an insulin-dependent diabetic. No anion gap or evidence of DKA.  Troponin is negative. EKG is reassuring. Highly doubt ACS.  Patient has pain medications at home as she already sees a pain clinic. Will add Robaxin to her regimen.  The patient was discussed with and seen by Dr. Jeneen Rinks who agrees with the treatment plan.   Final Clinical Impressions(s) / ED Diagnoses   Final diagnoses:  Sciatica of left side  Chest pain, unspecified chest pain type  Epigastric abdominal pain    New Prescriptions Discharge Medication List as of 05/17/2016  2:30 AM    START taking these medications   Details  methocarbamol (ROBAXIN) 500 MG tablet Take 1 tablet (500 mg total)  by mouth 2 (two) times daily., Starting Sun 05/17/2016, Print         Kriston Pasquarello, PA-C 05/17/16 [id number]    Tanna Furry, MD 05/19/16 1459

## 2016-05-17 DIAGNOSIS — R1084 Generalized abdominal pain: Secondary | ICD-10-CM | POA: Diagnosis not present

## 2016-05-17 DIAGNOSIS — R0789 Other chest pain: Secondary | ICD-10-CM | POA: Diagnosis not present

## 2016-05-17 MED ORDER — METHOCARBAMOL 500 MG PO TABS
500.0000 mg | ORAL_TABLET | Freq: Once | ORAL | Status: AC
  Administered 2016-05-17: 500 mg via ORAL
  Filled 2016-05-17: qty 1

## 2016-05-17 MED ORDER — OXYCODONE HCL 5 MG PO TABS
5.0000 mg | ORAL_TABLET | Freq: Once | ORAL | Status: AC
  Administered 2016-05-17: 5 mg via ORAL
  Filled 2016-05-17: qty 1

## 2016-05-17 MED ORDER — METHOCARBAMOL 500 MG PO TABS: 500.0000 mg | ORAL_TABLET | Freq: Two times a day (BID) | ORAL | 0 refills | Status: DC

## 2016-05-17 NOTE — ED Provider Notes (Signed)
Pt seen and evaluated.  Chest with PA [facility]. Patient reports exacerbation of her typical sciatica pain. However she has some different pain in her left lower chest. It hurts to move touch go from laying to sitting. Clearly musculoskeletal. However she complains of some pain in her scapula as well. I agree with CT imaging of her aorta. Her EKG is unchanged and her first enzyme is normal. With normal CT normal enzyme I would agree with discharge home with simple pain control measures.    Tanna Furry, MD 05/17/16 [phone]

## 2016-05-17 NOTE — Discharge Instructions (Signed)
1. Medications: robaxin, in addition to usual home medications 2. Treatment: rest, drink plenty of fluids, gentle stretching as discussed, alternate ice and heat 3. Follow Up: Please followup with your primary doctor in 3 days for discussion of your diagnoses and further evaluation after today's visit; if you do not have a primary care doctor use the resource guide provided to find one;  Return to the ER for worsening back pain, difficulty walking, loss of bowel or bladder control or other concerning symptoms

## 2016-05-21 ENCOUNTER — Ambulatory Visit: Payer: PPO | Admitting: Internal Medicine

## 2016-05-25 DIAGNOSIS — G894 Chronic pain syndrome: Secondary | ICD-10-CM | POA: Diagnosis not present

## 2016-05-25 DIAGNOSIS — M069 Rheumatoid arthritis, unspecified: Secondary | ICD-10-CM | POA: Diagnosis not present

## 2016-05-25 DIAGNOSIS — M5432 Sciatica, left side: Secondary | ICD-10-CM | POA: Diagnosis not present

## 2016-06-09 DIAGNOSIS — E785 Hyperlipidemia, unspecified: Secondary | ICD-10-CM | POA: Diagnosis not present

## 2016-06-09 DIAGNOSIS — E559 Vitamin D deficiency, unspecified: Secondary | ICD-10-CM | POA: Diagnosis not present

## 2016-06-09 DIAGNOSIS — I10 Essential (primary) hypertension: Secondary | ICD-10-CM | POA: Diagnosis not present

## 2016-06-09 DIAGNOSIS — G894 Chronic pain syndrome: Secondary | ICD-10-CM | POA: Diagnosis not present

## 2016-06-09 DIAGNOSIS — Z23 Encounter for immunization: Secondary | ICD-10-CM | POA: Diagnosis not present

## 2016-06-09 DIAGNOSIS — I119 Hypertensive heart disease without heart failure: Secondary | ICD-10-CM | POA: Diagnosis not present

## 2016-06-09 DIAGNOSIS — E119 Type 2 diabetes mellitus without complications: Secondary | ICD-10-CM | POA: Diagnosis not present

## 2016-06-09 DIAGNOSIS — J302 Other seasonal allergic rhinitis: Secondary | ICD-10-CM | POA: Diagnosis not present

## 2016-07-22 DIAGNOSIS — M069 Rheumatoid arthritis, unspecified: Secondary | ICD-10-CM | POA: Diagnosis not present

## 2016-07-22 DIAGNOSIS — G894 Chronic pain syndrome: Secondary | ICD-10-CM | POA: Diagnosis not present

## 2016-07-22 DIAGNOSIS — M5432 Sciatica, left side: Secondary | ICD-10-CM | POA: Diagnosis not present

## 2016-08-06 DIAGNOSIS — G894 Chronic pain syndrome: Secondary | ICD-10-CM | POA: Diagnosis not present

## 2016-08-06 DIAGNOSIS — I119 Hypertensive heart disease without heart failure: Secondary | ICD-10-CM | POA: Diagnosis not present

## 2016-08-06 DIAGNOSIS — E119 Type 2 diabetes mellitus without complications: Secondary | ICD-10-CM | POA: Diagnosis not present

## 2016-08-06 DIAGNOSIS — I10 Essential (primary) hypertension: Secondary | ICD-10-CM | POA: Diagnosis not present

## 2016-08-06 DIAGNOSIS — J302 Other seasonal allergic rhinitis: Secondary | ICD-10-CM | POA: Diagnosis not present

## 2016-08-06 DIAGNOSIS — E559 Vitamin D deficiency, unspecified: Secondary | ICD-10-CM | POA: Diagnosis not present

## 2016-08-06 DIAGNOSIS — E785 Hyperlipidemia, unspecified: Secondary | ICD-10-CM | POA: Diagnosis not present

## 2016-08-14 DIAGNOSIS — G894 Chronic pain syndrome: Secondary | ICD-10-CM | POA: Diagnosis not present

## 2016-08-14 DIAGNOSIS — E119 Type 2 diabetes mellitus without complications: Secondary | ICD-10-CM | POA: Diagnosis not present

## 2016-08-14 DIAGNOSIS — I10 Essential (primary) hypertension: Secondary | ICD-10-CM | POA: Diagnosis not present

## 2016-08-14 DIAGNOSIS — I119 Hypertensive heart disease without heart failure: Secondary | ICD-10-CM | POA: Diagnosis not present

## 2016-08-14 DIAGNOSIS — E785 Hyperlipidemia, unspecified: Secondary | ICD-10-CM | POA: Diagnosis not present

## 2016-08-14 DIAGNOSIS — J302 Other seasonal allergic rhinitis: Secondary | ICD-10-CM | POA: Diagnosis not present

## 2016-08-14 DIAGNOSIS — E559 Vitamin D deficiency, unspecified: Secondary | ICD-10-CM | POA: Diagnosis not present

## 2016-08-19 DIAGNOSIS — M069 Rheumatoid arthritis, unspecified: Secondary | ICD-10-CM | POA: Diagnosis not present

## 2016-08-19 DIAGNOSIS — G894 Chronic pain syndrome: Secondary | ICD-10-CM | POA: Diagnosis not present

## 2016-08-19 DIAGNOSIS — M5432 Sciatica, left side: Secondary | ICD-10-CM | POA: Diagnosis not present

## 2016-09-14 DIAGNOSIS — G894 Chronic pain syndrome: Secondary | ICD-10-CM | POA: Diagnosis not present

## 2016-09-14 DIAGNOSIS — M5432 Sciatica, left side: Secondary | ICD-10-CM | POA: Diagnosis not present

## 2016-09-14 DIAGNOSIS — M069 Rheumatoid arthritis, unspecified: Secondary | ICD-10-CM | POA: Diagnosis not present

## 2016-10-13 DIAGNOSIS — G894 Chronic pain syndrome: Secondary | ICD-10-CM | POA: Diagnosis not present

## 2016-10-13 DIAGNOSIS — M5432 Sciatica, left side: Secondary | ICD-10-CM | POA: Diagnosis not present

## 2016-10-13 DIAGNOSIS — M069 Rheumatoid arthritis, unspecified: Secondary | ICD-10-CM | POA: Diagnosis not present

## 2016-10-27 DIAGNOSIS — Z96653 Presence of artificial knee joint, bilateral: Secondary | ICD-10-CM | POA: Diagnosis not present

## 2016-10-27 DIAGNOSIS — M25552 Pain in left hip: Secondary | ICD-10-CM | POA: Diagnosis not present

## 2016-10-27 DIAGNOSIS — Z96651 Presence of right artificial knee joint: Secondary | ICD-10-CM | POA: Diagnosis not present

## 2016-10-27 DIAGNOSIS — Z96652 Presence of left artificial knee joint: Secondary | ICD-10-CM | POA: Diagnosis not present

## 2016-10-27 DIAGNOSIS — Z471 Aftercare following joint replacement surgery: Secondary | ICD-10-CM | POA: Diagnosis not present

## 2016-10-29 DIAGNOSIS — E119 Type 2 diabetes mellitus without complications: Secondary | ICD-10-CM | POA: Diagnosis not present

## 2016-10-29 DIAGNOSIS — I119 Hypertensive heart disease without heart failure: Secondary | ICD-10-CM | POA: Diagnosis not present

## 2016-10-29 DIAGNOSIS — G894 Chronic pain syndrome: Secondary | ICD-10-CM | POA: Diagnosis not present

## 2016-10-29 DIAGNOSIS — Z0000 Encounter for general adult medical examination without abnormal findings: Secondary | ICD-10-CM | POA: Diagnosis not present

## 2016-10-29 DIAGNOSIS — J302 Other seasonal allergic rhinitis: Secondary | ICD-10-CM | POA: Diagnosis not present

## 2016-10-29 DIAGNOSIS — E785 Hyperlipidemia, unspecified: Secondary | ICD-10-CM | POA: Diagnosis not present

## 2016-10-29 DIAGNOSIS — E559 Vitamin D deficiency, unspecified: Secondary | ICD-10-CM | POA: Diagnosis not present

## 2016-10-29 DIAGNOSIS — I10 Essential (primary) hypertension: Secondary | ICD-10-CM | POA: Diagnosis not present

## 2016-11-04 DIAGNOSIS — M5416 Radiculopathy, lumbar region: Secondary | ICD-10-CM | POA: Diagnosis not present

## 2016-11-09 DIAGNOSIS — G894 Chronic pain syndrome: Secondary | ICD-10-CM | POA: Diagnosis not present

## 2016-11-09 DIAGNOSIS — M545 Low back pain: Secondary | ICD-10-CM | POA: Diagnosis not present

## 2016-11-10 ENCOUNTER — Other Ambulatory Visit (HOSPITAL_COMMUNITY): Payer: Self-pay | Admitting: Orthopedic Surgery

## 2016-11-10 DIAGNOSIS — Z471 Aftercare following joint replacement surgery: Secondary | ICD-10-CM

## 2016-11-10 DIAGNOSIS — Z96652 Presence of left artificial knee joint: Secondary | ICD-10-CM

## 2016-11-23 ENCOUNTER — Encounter (HOSPITAL_COMMUNITY): Payer: PPO | Attending: Orthopedic Surgery

## 2016-11-23 ENCOUNTER — Encounter (HOSPITAL_COMMUNITY): Admission: RE | Admit: 2016-11-23 | Payer: PPO | Source: Ambulatory Visit

## 2016-11-24 DIAGNOSIS — M8588 Other specified disorders of bone density and structure, other site: Secondary | ICD-10-CM | POA: Diagnosis not present

## 2016-11-24 DIAGNOSIS — M48061 Spinal stenosis, lumbar region without neurogenic claudication: Secondary | ICD-10-CM | POA: Diagnosis not present

## 2016-11-24 DIAGNOSIS — M4316 Spondylolisthesis, lumbar region: Secondary | ICD-10-CM | POA: Diagnosis not present

## 2016-11-24 DIAGNOSIS — M5416 Radiculopathy, lumbar region: Secondary | ICD-10-CM | POA: Diagnosis not present

## 2016-12-07 DIAGNOSIS — M545 Low back pain: Secondary | ICD-10-CM | POA: Diagnosis not present

## 2016-12-07 DIAGNOSIS — G894 Chronic pain syndrome: Secondary | ICD-10-CM | POA: Diagnosis not present

## 2017-01-04 ENCOUNTER — Ambulatory Visit: Payer: Self-pay | Admitting: Internal Medicine

## 2017-01-04 DIAGNOSIS — G894 Chronic pain syndrome: Secondary | ICD-10-CM | POA: Diagnosis not present

## 2017-01-04 DIAGNOSIS — M545 Low back pain: Secondary | ICD-10-CM | POA: Diagnosis not present

## 2017-01-09 ENCOUNTER — Emergency Department (HOSPITAL_COMMUNITY): Payer: PPO

## 2017-01-09 ENCOUNTER — Encounter (HOSPITAL_COMMUNITY): Payer: Self-pay | Admitting: Emergency Medicine

## 2017-01-09 ENCOUNTER — Emergency Department (HOSPITAL_COMMUNITY)
Admission: EM | Admit: 2017-01-09 | Discharge: 2017-01-10 | Disposition: A | Discharge status: Home / Self Care | Payer: PPO | Attending: Emergency Medicine | Admitting: Emergency Medicine

## 2017-01-09 DIAGNOSIS — I119 Hypertensive heart disease without heart failure: Secondary | ICD-10-CM | POA: Insufficient documentation

## 2017-01-09 DIAGNOSIS — R1012 Left upper quadrant pain: Secondary | ICD-10-CM | POA: Diagnosis not present

## 2017-01-09 DIAGNOSIS — J45909 Unspecified asthma, uncomplicated: Secondary | ICD-10-CM | POA: Diagnosis not present

## 2017-01-09 DIAGNOSIS — R1010 Upper abdominal pain, unspecified: Secondary | ICD-10-CM | POA: Diagnosis not present

## 2017-01-09 DIAGNOSIS — E11319 Type 2 diabetes mellitus with unspecified diabetic retinopathy without macular edema: Secondary | ICD-10-CM | POA: Insufficient documentation

## 2017-01-09 DIAGNOSIS — Z7982 Long term (current) use of aspirin: Secondary | ICD-10-CM | POA: Diagnosis not present

## 2017-01-09 DIAGNOSIS — R109 Unspecified abdominal pain: Secondary | ICD-10-CM

## 2017-01-09 DIAGNOSIS — Z9689 Presence of other specified functional implants: Secondary | ICD-10-CM | POA: Diagnosis not present

## 2017-01-09 DIAGNOSIS — E1140 Type 2 diabetes mellitus with diabetic neuropathy, unspecified: Secondary | ICD-10-CM | POA: Diagnosis not present

## 2017-01-09 DIAGNOSIS — R079 Chest pain, unspecified: Secondary | ICD-10-CM | POA: Diagnosis not present

## 2017-01-09 DIAGNOSIS — Z91040 Latex allergy status: Secondary | ICD-10-CM | POA: Diagnosis not present

## 2017-01-09 DIAGNOSIS — M5442 Lumbago with sciatica, left side: Secondary | ICD-10-CM | POA: Diagnosis not present

## 2017-01-09 DIAGNOSIS — R1013 Epigastric pain: Secondary | ICD-10-CM | POA: Diagnosis not present

## 2017-01-09 DIAGNOSIS — M5432 Sciatica, left side: Secondary | ICD-10-CM

## 2017-01-09 DIAGNOSIS — Z794 Long term (current) use of insulin: Secondary | ICD-10-CM | POA: Diagnosis not present

## 2017-01-09 DIAGNOSIS — Z79899 Other long term (current) drug therapy: Secondary | ICD-10-CM | POA: Diagnosis not present

## 2017-01-09 DIAGNOSIS — R0602 Shortness of breath: Secondary | ICD-10-CM | POA: Diagnosis not present

## 2017-01-09 LAB — CBC
HCT: 37.3 % (ref 36.0–46.0)
Hemoglobin: 12 g/dL (ref 12.0–15.0)
MCH: 26.8 pg (ref 26.0–34.0)
MCHC: 32.2 g/dL (ref 30.0–36.0)
MCV: 83.3 fL (ref 78.0–100.0)
Platelets: 252 10*3/uL (ref 150–400)
RBC: 4.48 MIL/uL (ref 3.87–5.11)
RDW: 13 % (ref 11.5–15.5)
WBC: 6.2 10*3/uL (ref 4.0–10.5)

## 2017-01-09 LAB — BASIC METABOLIC PANEL
Anion gap: 9 (ref 5–15)
BUN: 14 mg/dL (ref 6–20)
CO2: 22 mmol/L (ref 22–32)
Calcium: 11 mg/dL — ABNORMAL HIGH (ref 8.9–10.3)
Chloride: 107 mmol/L (ref 101–111)
Creatinine, Ser: 0.61 mg/dL (ref 0.44–1.00)
GFR calc Af Amer: >60 mL/min (ref >60)
GFR calc non Af Amer: >60 mL/min (ref >60)
Glucose, Bld: 153 mg/dL — ABNORMAL HIGH (ref 65–99)
Potassium: 4.6 mmol/L (ref 3.5–5.1)
Sodium: 138 mmol/L (ref 135–145)

## 2017-01-09 LAB — HEPATIC FUNCTION PANEL
ALT: 26 U/L (ref 14–54)
AST: 26 U/L (ref 15–41)
Albumin: 3.5 g/dL (ref 3.5–5.0)
Alkaline Phosphatase: 63 U/L (ref 38–126)
Bilirubin, Direct: 0.1 mg/dL (ref 0.1–0.5)
Indirect Bilirubin: 0.5 mg/dL (ref 0.3–0.9)
Total Bilirubin: 0.6 mg/dL (ref 0.3–1.2)
Total Protein: 7.5 g/dL (ref 6.5–8.1)

## 2017-01-09 LAB — I-STAT TROPONIN, ED
Troponin i, poc: 0 ng/mL (ref 0.00–0.08)
Troponin i, poc: 0 ng/mL (ref 0.00–0.08)

## 2017-01-09 LAB — LIPASE, BLOOD: Lipase: 22 U/L (ref 11–51)

## 2017-01-09 MED ORDER — IOPAMIDOL (ISOVUE-300) INJECTION 61%
INTRAVENOUS | Status: AC
  Administered 2017-01-09: 100 mL
  Filled 2017-01-09: qty 100

## 2017-01-09 NOTE — ED Notes (Signed)
This writer attempted IV x2, once in right wrist, once in left AC. Pt states she is refusing any additional sticks.

## 2017-01-09 NOTE — ED Provider Notes (Signed)
[facility] DEPT Provider Note   CSN: [id number] Arrival date & time: 01/09/17  1658     History   Chief Complaint Chief Complaint  Patient presents with  . Sciatica  . Back Pain  . Chest Pain    HPI Katelyn [name] is a 71 y.o. female.  The history is provided by the patient and medical records.  Back Pain   Associated symptoms include chest pain.  Chest Pain   Associated symptoms include back pain.    71 year old female with history of allergic rhinitis, asthma, chronic pain syndrome, diabetes, hyperlipidemia, hyperparathyroidism, hypertension, obesity, sleep apnea, vitamin D deficiency, presenting to the ED with abdominal pain. Patient reports this is been waxing and waning over the past few months. States localized to her upper abdomen in the middle and off to the left. States over the past 6-7 months she has lost approximately 90 pounds unintentionally. States she has been trying to eat at regular intervals, however is only able to use small amounts and she feels very full afterwards.  States this is new over the past few months as well.  She denies any fever or chills.  No vomiting.  States she has had colonoscopy a few years ago, told she had polyps then.  States she has met with her doctor a few times since her symptoms began, but has not had any formal imaging of her abdomen.  States BM's have been normal.  Was briefly constipated because she was put on pain medication for her back but forgot to take stool softeners, this issue has since resolved.  States she has not had any chest pain or SOB recently.    Patient also reports some ongoing back pain. Today she has chronic issues with sciatica and it seems to be bothering her again. States she feels pain in her left lower back which radiates into the left buttock and down the left posterior thigh. She denies any numbness or weakness of her legs. No bowel or bladder incontinence. She's not had any recent falls or trauma.     Past Medical History:  Diagnosis Date  . Allergic rhinitis   . Asthma   . Chronic pain syndrome   . Colon polyp   . Diabetes mellitus   . H. pylori infection   . Hyperlipidemia   . Hyperparathyroidism ([place])   . Hypertension   . Hypertensive heart disease without heart failure   . Morbid obesity ([place])   . Sleep apnea   . Vitamin D deficiency     Patient Active Problem List   Diagnosis Date Noted  . Diabetic neuropathy ([place]) 05/07/2015  . Achilles tendinitis of left lower extremity 03/30/2014  . Spinal stenosis, lumbar region, with neurogenic claudication 02/16/2014  . Other chronic postoperative pain 02/16/2014  . Bilateral knee contractures 02/16/2014  . Obstructive sleep apnea 06/26/2013  . TRIGGER FINGER 04/29/2010  . Allergic rhinitis, cause unspecified 03/24/2010  . OSTEOARTHRITIS 02/10/2010  . DENTAL CARIES 09/24/2009  . DIABETIC  RETINOPATHY 04/29/2009  . ONYCHOMYCOSIS, TOENAILS 04/08/2009  . DYSLIPIDEMIA 04/08/2009  . EDEMA 04/08/2009  . HYPERTENSION 12/24/2008  . ELECTROCARDIOGRAM, ABNORMAL 12/17/2008  . SCIATICA, RIGHT 07/11/2007  . Type II or unspecified type diabetes mellitus without mention of complication, not stated as uncontrolled 04/11/2007  . ANEMIA DUE TO DIETARY IRON DEFICIENCY 04/11/2007  . HYPERTENSION, BENIGN ESSENTIAL 04/11/2007  . OSTEOARTHRITIS, GENERALIZED 04/11/2007    Past Surgical History:  Procedure Laterality Date  . JOINT REPLACEMENT  OB History    No data available       Home Medications    Prior to Admission medications   Medication Sig Start Date End Date Taking? Authorizing Provider  acetaminophen (TYLENOL) 500 MG tablet Take 1,000 mg by mouth 2 (two) times daily as needed for moderate pain.    [provider]  acetaminophen-codeine (TYLENOL #4) 300-60 MG tablet Take 1 tablet by mouth every 6 (six) hours as needed for pain.  04/27/16   [provider]  albuterol (PROVENTIL HFA;VENTOLIN HFA) 108 (90  BASE) MCG/ACT inhaler Inhale 2 puffs into the lungs daily as needed for wheezing.     [provider]  amLODipine (NORVASC) 5 MG tablet Take 5 mg by mouth daily.    [provider]  aspirin EC 81 MG tablet Take 81 mg by mouth every morning.     [provider]  Azelastine-Fluticasone (DYMISTA) 137-50 MCG/ACT SUSP Place 2 sprays into both nostrils at bedtime. Patient taking differently: Place 2 sprays into both nostrils at bedtime as needed (for sinus congestion).  06/26/13   Young, Tarri Fuller D, MD  Diclofenac-Misoprostol 75-0.2 MG TBEC Take 1 tablet by mouth 2 (two) times daily.  05/09/16   [provider]  exenatide (BYETTA) 5 MCG/0.02ML SOPN injection Inject 5 mcg into the skin 2 (two) times daily with a meal.    [provider]  famotidine (PEPCID) 20 MG tablet Take 20 mg by mouth 2 (two) times daily.    [provider]  gabapentin (NEURONTIN) 300 MG capsule TAKE 1 CAPSULE(300 MG) BY MOUTH THREE TIMES DAILY Patient taking differently: TAKE 1 CAPSULE (300 MG) BY MOUTH THREE TIMES DAILY 08/14/15   Kirsteins, Luanna Salk, MD  insulin aspart protamine-insulin aspart (NOVOLOG 70/30) (70-30) 100 UNIT/ML injection Inject 50 Units into the skin 2 (two) times daily.     [provider]  lisinopril-hydrochlorothiazide (PRINZIDE,ZESTORETIC) 20-25 MG per tablet Take 1 tablet by mouth daily.    [provider]  metFORMIN (GLUCOPHAGE) 1000 MG tablet Take 1,000 mg by mouth 2 (two) times daily with a meal.    [provider]  methocarbamol (ROBAXIN) 500 MG tablet Take 1 tablet (500 mg total) by mouth 2 (two) times daily. 05/17/16   Muthersbaugh, Jarrett Soho, PA-C  misoprostol (CYTOTEC) 200 MCG tablet TAKE 1 TABLET BY MOUTH TWICE DAILY Patient not taking: Reported on 05/16/2016 09/16/15   Charlett Blake, MD  mometasone (NASONEX) 50 MCG/ACT nasal spray Place 1 spray into the nose 2 (two) times daily.    [provider]  Multiple  Vitamin (MULTIVITAMIN) tablet Take 1 tablet by mouth daily.    [provider]  oxyCODONE-acetaminophen (PERCOCET/ROXICET) 5-325 MG tablet Take 1 tablet by mouth every 8 (eight) hours as needed for severe pain. 01/27/16   Shary Decamp, PA-C  tolterodine (DETROL) 2 MG tablet Take 0.5 tablets (1 mg total) by mouth 2 (two) times daily. Patient not taking: Reported on 05/16/2016 05/07/15   Charlett Blake, MD    Family History Family History  Problem Relation Age of Onset  . Prostate cancer Father     Social History Social History  Substance Use Topics  . Smoking status: Never Smoker  . Smokeless tobacco: Never Used  . Alcohol use No     Allergies   Aspirin; Hydrochlorothiazide; Ibuprofen; Latex; and Tramadol   Review of Systems Review of Systems  Cardiovascular: Positive for chest pain.  Musculoskeletal: Positive for back pain.  All other systems reviewed and  are negative.    Physical Exam Updated Vital Signs BP (!) 171/101 (BP Location: Right Arm)   Pulse 87   Temp 98.1 F (36.7 C) (Oral)   Resp 16   Ht 5' 5"  (1.651 m)   Wt 94.3 kg   SpO2 100%   BMI 34.61 kg/m   Physical Exam  Constitutional: She is oriented to person, place, and time. She appears well-developed and well-nourished.  HENT:  Head: Normocephalic and atraumatic.  Mouth/Throat: Oropharynx is clear and moist.  Eyes: Conjunctivae and EOM are normal. Pupils are equal, round, and reactive to light.  Neck: Normal range of motion.  Cardiovascular: Normal rate, regular rhythm and normal heart sounds.   Pulmonary/Chest: Effort normal and breath sounds normal. No respiratory distress. She has no wheezes.  Abdominal: Soft. Bowel sounds are normal. There is tenderness in the epigastric area and left upper quadrant.  Musculoskeletal: Normal range of motion.  Neurological: She is alert and oriented to person, place, and time.  Skin: Skin is warm and dry.  Psychiatric: She has a normal mood and affect.    Nursing note and vitals reviewed.    ED Treatments / Results  Labs (all labs ordered are listed, but only abnormal results are displayed) Labs Reviewed  BASIC METABOLIC PANEL - Abnormal; Notable for the following:       Result Value   Glucose, Bld 153 (*)    Calcium 11.0 (*)    All other components within normal limits  CBC  HEPATIC FUNCTION PANEL  LIPASE, BLOOD  I-STAT TROPOININ, ED  Randolm Idol, ED     Radiology Dg Chest 2 View  Result Date: 01/09/2017 CLINICAL DATA:  Left chest pain and shortness of breath for 4 days. EXAM: CHEST  2 VIEW COMPARISON:  05/16/2016 and prior exams FINDINGS: The cardiomediastinal silhouette is unremarkable. There is no evidence of focal airspace disease, pulmonary edema, suspicious pulmonary nodule/mass, pleural effusion, or pneumothorax. No acute bony abnormalities are identified. IMPRESSION: No active cardiopulmonary disease. Electronically Signed   By: Margarette Canada M.D.   On: 01/09/2017 19:08   Ct Abdomen Pelvis W Contrast  Result Date: 01/10/2017 CLINICAL DATA:  Upper abdominal pain.  90 pound weight loss. EXAM: CT ABDOMEN AND PELVIS WITH CONTRAST TECHNIQUE: Multidetector CT imaging of the abdomen and pelvis was performed using the standard protocol following bolus administration of intravenous contrast. CONTRAST:  100 mL ISOVUE-300 IOPAMIDOL (ISOVUE-300) INJECTION 61% COMPARISON:  None. FINDINGS: Lower chest: Lung bases are clear. Hepatobiliary: No focal liver abnormality is seen. No gallstones, gallbladder wall thickening, or biliary dilatation. Pancreas: Unremarkable. No pancreatic ductal dilatation or surrounding inflammatory changes. Spleen: Normal in size without focal abnormality. Adrenals/Urinary Tract: Adrenal glands are unremarkable. Kidneys are normal, without renal calculi, focal lesion, or hydronephrosis. Bladder is unremarkable. Stomach/Bowel: Stomach, small bowel, and colon are mostly decompressed. Scattered stool in the colon. No  wall thickening is appreciated. Appendix is normal. Vascular/Lymphatic: Aortic atherosclerosis. No enlarged abdominal or pelvic lymph nodes. Reproductive: Uterus is retroverted. Calcified and enhancing nodules consistent with fibroids. Ovaries are not enlarged. Other: No free air or free fluid in the abdomen. There is a midline ventral abdominal wall hernia containing fat. Musculoskeletal: Degenerative changes in the spine. No destructive bone lesions. IMPRESSION: No acute process demonstrated in the abdomen or pelvis. No evidence of bowel obstruction or inflammation. Uterine fibroids. Aortic atherosclerosis. Electronically Signed   By: Lucienne Capers M.D.   On: 01/10/2017 00:01    Procedures Procedures (including critical care time)  Medications Ordered in ED Medications - No data to display   Initial Impression / Assessment and Plan / ED Course  I have reviewed the triage vital signs and the nursing notes.  Pertinent labs & imaging results that were available during my care of the patient were reviewed by me and considered in my medical decision making (see chart for details).  71 year old female here with back pain and abdominal pain. Triage note reports chest pain, however is actually upper abdominal pain.  Denies chest pain or SOB.  1.  Abdominal pain-- epigastric and LUQ with associated early satiety. Recent 90 lb unintentional weight loss over the past 6-7 months.  Prior colonoscopy in 2011 did show polyps, was supposed to have this redone a few years ago but never followed up.  Abdomen is soft but there is tenderness in the epigastric and left upper quadrant. No peritonitis. Labwork is overall reassuring. CT scan was obtained given her weight loss without any acute findings. Recommend she follow-up with her PCP as this is a significant amount of weight loss in short time frame.  Can also see her GI doctor.  2.  Back pain-- has chronic issues with sciatica. She has no focal neurologic  deficits here. No signs or symptoms concerning for cauda equina. Based on chart review patient was recently dismissed from her pain management clinic secondary to abusing alcohol while taking narcotics. She has no new symptoms here, she can continue conservative therapy at home.  Follow-up with PCP.  Discussed plan with patient, she acknowledged understanding and agreed with plan of care.  Return precautions given for new or worsening symptoms.  Final Clinical Impressions(s) / ED Diagnoses   Final diagnoses:  Abdominal pain, unspecified abdominal location  Sciatica of left side    New Prescriptions New Prescriptions   No medications on file     Kathryne Hitch 01/10/17 0033    Lacretia Leigh, MD 01/10/17 2104

## 2017-01-09 NOTE — ED Triage Notes (Signed)
Pt c/o chest pain- under left breast hurts with movement -- and chronic sciatica on left side radiating down left leg-- no diaphoresis, no nausea.

## 2017-01-09 NOTE — ED Notes (Signed)
Patient transported to CT 

## 2017-01-09 NOTE — ED Notes (Signed)
Pt returned from CT °

## 2017-01-10 NOTE — ED Notes (Signed)
Pt understood dc material. NAD noted. 

## 2017-01-10 NOTE — Discharge Instructions (Signed)
As we discussed, your lab work and imaging today looked good. I do recommend to follow-up with your doctor about your weight loss. Return here for any new/worsening symptoms.

## 2017-02-01 DIAGNOSIS — E785 Hyperlipidemia, unspecified: Secondary | ICD-10-CM | POA: Diagnosis not present

## 2017-02-01 DIAGNOSIS — R0602 Shortness of breath: Secondary | ICD-10-CM | POA: Diagnosis not present

## 2017-02-01 DIAGNOSIS — M545 Low back pain: Secondary | ICD-10-CM | POA: Diagnosis not present

## 2017-02-01 DIAGNOSIS — E559 Vitamin D deficiency, unspecified: Secondary | ICD-10-CM | POA: Diagnosis not present

## 2017-02-01 DIAGNOSIS — R42 Dizziness and giddiness: Secondary | ICD-10-CM | POA: Diagnosis not present

## 2017-02-01 DIAGNOSIS — I119 Hypertensive heart disease without heart failure: Secondary | ICD-10-CM | POA: Diagnosis not present

## 2017-02-01 DIAGNOSIS — J302 Other seasonal allergic rhinitis: Secondary | ICD-10-CM | POA: Diagnosis not present

## 2017-02-01 DIAGNOSIS — I10 Essential (primary) hypertension: Secondary | ICD-10-CM | POA: Diagnosis not present

## 2017-02-01 DIAGNOSIS — E119 Type 2 diabetes mellitus without complications: Secondary | ICD-10-CM | POA: Diagnosis not present

## 2017-02-01 DIAGNOSIS — G894 Chronic pain syndrome: Secondary | ICD-10-CM | POA: Diagnosis not present

## 2017-02-12 DIAGNOSIS — I10 Essential (primary) hypertension: Secondary | ICD-10-CM | POA: Diagnosis not present

## 2017-02-12 DIAGNOSIS — I119 Hypertensive heart disease without heart failure: Secondary | ICD-10-CM | POA: Diagnosis not present

## 2017-03-08 DIAGNOSIS — R42 Dizziness and giddiness: Secondary | ICD-10-CM | POA: Diagnosis not present

## 2017-03-08 DIAGNOSIS — I119 Hypertensive heart disease without heart failure: Secondary | ICD-10-CM | POA: Diagnosis not present

## 2017-03-08 DIAGNOSIS — E559 Vitamin D deficiency, unspecified: Secondary | ICD-10-CM | POA: Diagnosis not present

## 2017-03-08 DIAGNOSIS — G894 Chronic pain syndrome: Secondary | ICD-10-CM | POA: Diagnosis not present

## 2017-03-08 DIAGNOSIS — J302 Other seasonal allergic rhinitis: Secondary | ICD-10-CM | POA: Diagnosis not present

## 2017-03-08 DIAGNOSIS — I10 Essential (primary) hypertension: Secondary | ICD-10-CM | POA: Diagnosis not present

## 2017-03-08 DIAGNOSIS — E785 Hyperlipidemia, unspecified: Secondary | ICD-10-CM | POA: Diagnosis not present

## 2017-03-08 DIAGNOSIS — E119 Type 2 diabetes mellitus without complications: Secondary | ICD-10-CM | POA: Diagnosis not present

## 2017-03-08 DIAGNOSIS — R110 Nausea: Secondary | ICD-10-CM | POA: Diagnosis not present

## 2017-03-19 DIAGNOSIS — G894 Chronic pain syndrome: Secondary | ICD-10-CM | POA: Diagnosis not present

## 2017-03-19 DIAGNOSIS — I119 Hypertensive heart disease without heart failure: Secondary | ICD-10-CM | POA: Diagnosis not present

## 2017-03-19 DIAGNOSIS — E785 Hyperlipidemia, unspecified: Secondary | ICD-10-CM | POA: Diagnosis not present

## 2017-03-19 DIAGNOSIS — R52 Pain, unspecified: Secondary | ICD-10-CM | POA: Diagnosis not present

## 2017-03-19 DIAGNOSIS — I10 Essential (primary) hypertension: Secondary | ICD-10-CM | POA: Diagnosis not present

## 2017-03-19 DIAGNOSIS — E559 Vitamin D deficiency, unspecified: Secondary | ICD-10-CM | POA: Diagnosis not present

## 2017-03-19 DIAGNOSIS — J302 Other seasonal allergic rhinitis: Secondary | ICD-10-CM | POA: Diagnosis not present

## 2017-03-19 DIAGNOSIS — E119 Type 2 diabetes mellitus without complications: Secondary | ICD-10-CM | POA: Diagnosis not present

## 2017-04-02 DIAGNOSIS — Z0000 Encounter for general adult medical examination without abnormal findings: Secondary | ICD-10-CM | POA: Diagnosis not present

## 2017-04-02 DIAGNOSIS — I10 Essential (primary) hypertension: Secondary | ICD-10-CM | POA: Diagnosis not present

## 2017-04-02 DIAGNOSIS — E559 Vitamin D deficiency, unspecified: Secondary | ICD-10-CM | POA: Diagnosis not present

## 2017-04-02 DIAGNOSIS — I119 Hypertensive heart disease without heart failure: Secondary | ICD-10-CM | POA: Diagnosis not present

## 2017-04-02 DIAGNOSIS — E119 Type 2 diabetes mellitus without complications: Secondary | ICD-10-CM | POA: Diagnosis not present

## 2017-04-02 DIAGNOSIS — E785 Hyperlipidemia, unspecified: Secondary | ICD-10-CM | POA: Diagnosis not present

## 2017-04-02 DIAGNOSIS — J302 Other seasonal allergic rhinitis: Secondary | ICD-10-CM | POA: Diagnosis not present

## 2017-04-13 ENCOUNTER — Ambulatory Visit: Payer: PPO | Admitting: Registered"

## 2017-07-15 ENCOUNTER — Inpatient Hospital Stay (HOSPITAL_COMMUNITY)
Admission: EM | Admit: 2017-07-15 | Discharge: 2017-07-17 | DRG: 072 | Disposition: A | Discharge status: Home / Self Care | Payer: PPO | Attending: Internal Medicine | Admitting: Internal Medicine

## 2017-07-15 ENCOUNTER — Emergency Department (HOSPITAL_COMMUNITY): Payer: PPO

## 2017-07-15 ENCOUNTER — Encounter (HOSPITAL_COMMUNITY): Payer: Self-pay

## 2017-07-15 ENCOUNTER — Other Ambulatory Visit: Payer: Self-pay

## 2017-07-15 DIAGNOSIS — R4182 Altered mental status, unspecified: Secondary | ICD-10-CM | POA: Diagnosis not present

## 2017-07-15 DIAGNOSIS — Z794 Long term (current) use of insulin: Secondary | ICD-10-CM

## 2017-07-15 DIAGNOSIS — M1990 Unspecified osteoarthritis, unspecified site: Secondary | ICD-10-CM | POA: Diagnosis not present

## 2017-07-15 DIAGNOSIS — Z86010 Personal history of colonic polyps: Secondary | ICD-10-CM

## 2017-07-15 DIAGNOSIS — R404 Transient alteration of awareness: Secondary | ICD-10-CM | POA: Diagnosis not present

## 2017-07-15 DIAGNOSIS — R627 Adult failure to thrive: Secondary | ICD-10-CM | POA: Diagnosis present

## 2017-07-15 DIAGNOSIS — E213 Hyperparathyroidism, unspecified: Secondary | ICD-10-CM | POA: Diagnosis present

## 2017-07-15 DIAGNOSIS — Z888 Allergy status to other drugs, medicaments and biological substances status: Secondary | ICD-10-CM | POA: Diagnosis not present

## 2017-07-15 DIAGNOSIS — E1143 Type 2 diabetes mellitus with diabetic autonomic (poly)neuropathy: Secondary | ICD-10-CM | POA: Diagnosis not present

## 2017-07-15 DIAGNOSIS — R1012 Left upper quadrant pain: Secondary | ICD-10-CM | POA: Diagnosis not present

## 2017-07-15 DIAGNOSIS — E1140 Type 2 diabetes mellitus with diabetic neuropathy, unspecified: Secondary | ICD-10-CM | POA: Diagnosis present

## 2017-07-15 DIAGNOSIS — G9341 Metabolic encephalopathy: Secondary | ICD-10-CM | POA: Diagnosis present

## 2017-07-15 DIAGNOSIS — E11319 Type 2 diabetes mellitus with unspecified diabetic retinopathy without macular edema: Secondary | ICD-10-CM | POA: Diagnosis present

## 2017-07-15 DIAGNOSIS — G894 Chronic pain syndrome: Secondary | ICD-10-CM | POA: Diagnosis not present

## 2017-07-15 DIAGNOSIS — J45909 Unspecified asthma, uncomplicated: Secondary | ICD-10-CM | POA: Diagnosis present

## 2017-07-15 DIAGNOSIS — K3184 Gastroparesis: Secondary | ICD-10-CM | POA: Diagnosis not present

## 2017-07-15 DIAGNOSIS — E559 Vitamin D deficiency, unspecified: Secondary | ICD-10-CM | POA: Diagnosis not present

## 2017-07-15 DIAGNOSIS — Z886 Allergy status to analgesic agent status: Secondary | ICD-10-CM | POA: Diagnosis not present

## 2017-07-15 DIAGNOSIS — G4733 Obstructive sleep apnea (adult) (pediatric): Secondary | ICD-10-CM | POA: Diagnosis present

## 2017-07-15 DIAGNOSIS — K219 Gastro-esophageal reflux disease without esophagitis: Secondary | ICD-10-CM | POA: Diagnosis not present

## 2017-07-15 DIAGNOSIS — E876 Hypokalemia: Secondary | ICD-10-CM | POA: Diagnosis present

## 2017-07-15 DIAGNOSIS — R634 Abnormal weight loss: Secondary | ICD-10-CM | POA: Diagnosis not present

## 2017-07-15 DIAGNOSIS — D649 Anemia, unspecified: Secondary | ICD-10-CM | POA: Diagnosis present

## 2017-07-15 DIAGNOSIS — R1030 Lower abdominal pain, unspecified: Secondary | ICD-10-CM | POA: Diagnosis not present

## 2017-07-15 DIAGNOSIS — Z7982 Long term (current) use of aspirin: Secondary | ICD-10-CM | POA: Diagnosis not present

## 2017-07-15 DIAGNOSIS — Z885 Allergy status to narcotic agent status: Secondary | ICD-10-CM | POA: Diagnosis not present

## 2017-07-15 DIAGNOSIS — K5730 Diverticulosis of large intestine without perforation or abscess without bleeding: Secondary | ICD-10-CM | POA: Diagnosis not present

## 2017-07-15 DIAGNOSIS — E860 Dehydration: Secondary | ICD-10-CM | POA: Diagnosis present

## 2017-07-15 DIAGNOSIS — Z6833 Body mass index (BMI) 33.0-33.9, adult: Secondary | ICD-10-CM

## 2017-07-15 DIAGNOSIS — R410 Disorientation, unspecified: Secondary | ICD-10-CM

## 2017-07-15 DIAGNOSIS — E861 Hypovolemia: Secondary | ICD-10-CM | POA: Diagnosis present

## 2017-07-15 DIAGNOSIS — E785 Hyperlipidemia, unspecified: Secondary | ICD-10-CM | POA: Diagnosis present

## 2017-07-15 DIAGNOSIS — M48062 Spinal stenosis, lumbar region with neurogenic claudication: Secondary | ICD-10-CM | POA: Diagnosis not present

## 2017-07-15 DIAGNOSIS — R911 Solitary pulmonary nodule: Secondary | ICD-10-CM | POA: Diagnosis not present

## 2017-07-15 DIAGNOSIS — R9431 Abnormal electrocardiogram [ECG] [EKG]: Secondary | ICD-10-CM | POA: Diagnosis not present

## 2017-07-15 DIAGNOSIS — Z8042 Family history of malignant neoplasm of prostate: Secondary | ICD-10-CM | POA: Diagnosis not present

## 2017-07-15 DIAGNOSIS — Z91040 Latex allergy status: Secondary | ICD-10-CM

## 2017-07-15 DIAGNOSIS — L0292 Furuncle, unspecified: Secondary | ICD-10-CM | POA: Diagnosis present

## 2017-07-15 DIAGNOSIS — E119 Type 2 diabetes mellitus without complications: Secondary | ICD-10-CM | POA: Diagnosis not present

## 2017-07-15 DIAGNOSIS — I10 Essential (primary) hypertension: Secondary | ICD-10-CM | POA: Diagnosis not present

## 2017-07-15 DIAGNOSIS — R109 Unspecified abdominal pain: Secondary | ICD-10-CM | POA: Diagnosis present

## 2017-07-15 DIAGNOSIS — Z7951 Long term (current) use of inhaled steroids: Secondary | ICD-10-CM

## 2017-07-15 LAB — COMPREHENSIVE METABOLIC PANEL
ALT: 13 U/L — ABNORMAL LOW (ref 14–54)
AST: 17 U/L (ref 15–41)
Albumin: 3.7 g/dL (ref 3.5–5.0)
Alkaline Phosphatase: 102 U/L (ref 38–126)
Anion gap: 7 (ref 5–15)
BUN: 15 mg/dL (ref 6–20)
CO2: 23 mmol/L (ref 22–32)
Calcium: 10.7 mg/dL — ABNORMAL HIGH (ref 8.9–10.3)
Chloride: 105 mmol/L (ref 101–111)
Creatinine, Ser: 0.63 mg/dL (ref 0.44–1.00)
GFR calc Af Amer: >60 mL/min (ref >60)
GFR calc non Af Amer: >60 mL/min (ref >60)
Glucose, Bld: 337 mg/dL — ABNORMAL HIGH (ref 65–99)
Potassium: 3.8 mmol/L (ref 3.5–5.1)
Sodium: 135 mmol/L (ref 135–145)
Total Bilirubin: 0.6 mg/dL (ref 0.3–1.2)
Total Protein: 8 g/dL (ref 6.5–8.1)

## 2017-07-15 LAB — URINALYSIS, ROUTINE W REFLEX MICROSCOPIC
Bacteria, UA: NONE SEEN
Bilirubin Urine: NEGATIVE
Glucose, UA: >=500 mg/dL — AB
Hgb urine dipstick: NEGATIVE
Ketones, ur: NEGATIVE mg/dL
Leukocytes, UA: NEGATIVE
Nitrite: NEGATIVE
Protein, ur: NEGATIVE mg/dL
Specific Gravity, Urine: >1.046 — ABNORMAL HIGH (ref 1.005–1.030)
pH: 6 (ref 5.0–8.0)

## 2017-07-15 LAB — CBG MONITORING, ED
Glucose-Capillary: 314 mg/dL — ABNORMAL HIGH (ref 65–99)
Glucose-Capillary: 83 mg/dL (ref 65–99)

## 2017-07-15 LAB — CBC
HCT: 38.8 % (ref 36.0–46.0)
Hemoglobin: 12.8 g/dL (ref 12.0–15.0)
MCH: 27.4 pg (ref 26.0–34.0)
MCHC: 33 g/dL (ref 30.0–36.0)
MCV: 83.1 fL (ref 78.0–100.0)
Platelets: 245 10*3/uL (ref 150–400)
RBC: 4.67 MIL/uL (ref 3.87–5.11)
RDW: 12.4 % (ref 11.5–15.5)
WBC: 6.4 10*3/uL (ref 4.0–10.5)

## 2017-07-15 LAB — RAPID URINE DRUG SCREEN, HOSP PERFORMED
Amphetamines: NOT DETECTED
Barbiturates: NOT DETECTED
Benzodiazepines: NOT DETECTED
Cocaine: NOT DETECTED
Opiates: NOT DETECTED
Tetrahydrocannabinol: NOT DETECTED

## 2017-07-15 LAB — I-STAT CG4 LACTIC ACID, ED
Lactic Acid, Venous: 2.2 mmol/L (ref 0.5–1.9)
Lactic Acid, Venous: 1.05 mmol/L (ref 0.5–1.9)

## 2017-07-15 LAB — ETHANOL: Alcohol, Ethyl (B): <10 mg/dL (ref <10)

## 2017-07-15 LAB — AMMONIA: Ammonia: 35 umol/L (ref 9–35)

## 2017-07-15 LAB — ACETAMINOPHEN LEVEL: Acetaminophen (Tylenol), Serum: <10 ug/mL — ABNORMAL LOW (ref 10–30)

## 2017-07-15 LAB — SALICYLATE LEVEL: Salicylate Lvl: <7 mg/dL (ref 2.8–30.0)

## 2017-07-15 LAB — LIPASE, BLOOD: Lipase: 32 U/L (ref 11–51)

## 2017-07-15 MED ORDER — FLUTICASONE PROPIONATE 50 MCG/ACT NA SUSP: 1.0000 | Freq: Every day | NASAL | Status: DC

## 2017-07-15 MED ORDER — SODIUM CHLORIDE 0.9 % IV BOLUS (SEPSIS)
1000.0000 mL | Freq: Once | INTRAVENOUS | Status: AC
  Administered 2017-07-15: 1000 mL via INTRAVENOUS

## 2017-07-15 MED ORDER — HYDROXYZINE HCL 25 MG PO TABS
25.0000 mg | ORAL_TABLET | Freq: Once | ORAL | Status: AC
  Administered 2017-07-15: 25 mg via ORAL
  Filled 2017-07-15: qty 1

## 2017-07-15 MED ORDER — SODIUM CHLORIDE 0.9 % IV SOLN
INTRAVENOUS | Status: DC
  Administered 2017-07-16 – 2017-07-17 (×3): via INTRAVENOUS

## 2017-07-15 MED ORDER — SODIUM CHLORIDE 0.9 % IV BOLUS (SEPSIS): 1000.0000 mL | Freq: Once | INTRAVENOUS | Status: DC

## 2017-07-15 MED ORDER — ASPIRIN EC 81 MG PO TBEC
81.0000 mg | DELAYED_RELEASE_TABLET | Freq: Every day | ORAL | Status: DC
  Administered 2017-07-16 – 2017-07-17 (×2): 81 mg via ORAL
  Filled 2017-07-15 (×2): qty 1

## 2017-07-15 MED ORDER — AZELASTINE-FLUTICASONE 137-50 MCG/ACT NA SUSP: 2.0000 | Freq: Every day | NASAL | Status: DC

## 2017-07-15 MED ORDER — ACETAMINOPHEN 325 MG PO TABS: 650.0000 mg | ORAL_TABLET | Freq: Two times a day (BID) | ORAL | Status: DC | PRN

## 2017-07-15 MED ORDER — AMLODIPINE BESYLATE 5 MG PO TABS
5.0000 mg | ORAL_TABLET | Freq: Every day | ORAL | Status: DC
  Administered 2017-07-16 – 2017-07-17 (×2): 5 mg via ORAL
  Filled 2017-07-15 (×2): qty 1

## 2017-07-15 MED ORDER — IOPAMIDOL (ISOVUE-300) INJECTION 61%
INTRAVENOUS | Status: AC
  Administered 2017-07-15: 100 mL
  Filled 2017-07-15: qty 100

## 2017-07-15 MED ORDER — INSULIN ASPART PROT & ASPART (70-30 MIX) 100 UNIT/ML ~~LOC~~ SUSP
50.0000 [IU] | Freq: Two times a day (BID) | SUBCUTANEOUS | Status: DC
  Administered 2017-07-15: 50 [IU] via SUBCUTANEOUS
  Filled 2017-07-15: qty 10

## 2017-07-15 MED ORDER — DICLOFENAC SODIUM 1 % TD GEL: 1.0000 "application " | Freq: Two times a day (BID) | TRANSDERMAL | Status: DC

## 2017-07-15 MED ORDER — FAMOTIDINE 20 MG PO TABS
20.0000 mg | ORAL_TABLET | Freq: Two times a day (BID) | ORAL | Status: DC
  Administered 2017-07-16 – 2017-07-17 (×4): 20 mg via ORAL
  Filled 2017-07-15 (×4): qty 1

## 2017-07-15 MED ORDER — HYDROCODONE-ACETAMINOPHEN 10-325 MG PO TABS: 1.0000 | ORAL_TABLET | Freq: Four times a day (QID) | ORAL | Status: DC | PRN

## 2017-07-15 MED ORDER — ADULT MULTIVITAMIN W/MINERALS CH
1.0000 | ORAL_TABLET | Freq: Every day | ORAL | Status: DC
  Administered 2017-07-16 – 2017-07-17 (×2): 1 via ORAL
  Filled 2017-07-15 (×2): qty 1

## 2017-07-15 NOTE — ED Provider Notes (Signed)
[name] Provider Note   CSN: [id number] Arrival date & time: 07/15/17  1518 History   Chief Complaint Chief Complaint  Patient presents with  . Dysuria  . Abdominal Pain  . Altered Mental Status   HPI K[name] is a 71 y.o. female with past medical history of diabetes hypertension who presents with increasing confusion and urinary incontinence.  Patient sister is at bedside and states that over the past 4 weeks the patient has been more forgetful repeatedly asking same question.  Sister states that she noted the patient had been urinating on herself.  Per the sister the patient is normally very independent and still able to drive.  Patient is able to perform all her ADLs get her own groceries and cooking her own meals.  However more recently she has been very forgetful not taking her medications and appears confused at times.  Both the sister and the patient denies any recent falls.  The patient complains of pain in her left lower abdomen.  She has not had any recent fevers or chills.  Patient denies any chest pain or shortness of breath.  Nothing seems to make the patient's symptoms better or worse.  Symptoms have been gradually getting worse over the past 4 weeks. Patient's sister at bedside states that she is concerned that patient's daughter has not been caring for the patient and the patient has lost a large amount of weight recently.   HPI  Past Medical History:  Diagnosis Date  . Allergic rhinitis   . Asthma   . Chronic pain syndrome   . Colon polyp   . Diabetes mellitus   . H. pylori infection   . Hyperlipidemia   . Hyperparathyroidism ([place])   . Hypertension   . Hypertensive heart disease without heart failure   . Morbid obesity ([place])   . Sleep apnea   . Vitamin D deficiency    Patient Active Problem List   Diagnosis Date Noted  . Diabetic neuropathy ([place]) 05/07/2015  . Achilles tendinitis of left lower extremity  03/30/2014  . Spinal stenosis, lumbar region, with neurogenic claudication 02/16/2014  . Other chronic postoperative pain 02/16/2014  . Bilateral knee contractures 02/16/2014  . Obstructive sleep apnea 06/26/2013  . TRIGGER FINGER 04/29/2010  . Allergic rhinitis, cause unspecified 03/24/2010  . OSTEOARTHRITIS 02/10/2010  . DENTAL CARIES 09/24/2009  . DIABETIC  RETINOPATHY 04/29/2009  . ONYCHOMYCOSIS, TOENAILS 04/08/2009  . DYSLIPIDEMIA 04/08/2009  . EDEMA 04/08/2009  . HYPERTENSION 12/24/2008  . ELECTROCARDIOGRAM, ABNORMAL 12/17/2008  . SCIATICA, RIGHT 07/11/2007  . Type II or unspecified type diabetes mellitus without mention of complication, not stated as uncontrolled 04/11/2007  . ANEMIA DUE TO DIETARY IRON DEFICIENCY 04/11/2007  . HYPERTENSION, BENIGN ESSENTIAL 04/11/2007  . OSTEOARTHRITIS, GENERALIZED 04/11/2007   Past Surgical History:  Procedure Laterality Date  . JOINT REPLACEMENT     OB History    No data available     Home Medications    Prior to Admission medications   Medication Sig Start Date End Date Taking? Authorizing Provider  acetaminophen (TYLENOL) 500 MG tablet Take 1,000 mg by mouth 2 (two) times daily as needed for moderate pain.    [provider]  albuterol (PROVENTIL HFA;VENTOLIN HFA) 108 (90 BASE) MCG/ACT inhaler Inhale 2 puffs into the lungs daily as needed for wheezing.     [provider]  amLODipine (NORVASC) 5 MG tablet Take 5 mg by mouth daily.    [provider]  aspirin EC 81 MG tablet Take 81 mg by mouth daily.     [provider]  Azelastine-Fluticasone (DYMISTA) 137-50 MCG/ACT SUSP Place 2 sprays into both nostrils at bedtime. Patient taking differently: Place 2 sprays into both nostrils at bedtime as needed (for sinus congestion).  06/26/13   Baird Lyons D, MD  diclofenac sodium (VOLTAREN) 1 % GEL Apply 1 application topically 2 (two) times daily. 10/30/16   [provider]    Diclofenac-Misoprostol 75-0.2 MG TBEC Take 1 tablet by mouth 2 (two) times daily.  05/09/16   [provider]  exenatide (BYETTA) 5 MCG/0.02ML SOPN injection Inject 5 mcg into the skin 2 (two) times daily with a meal.    [provider]  famotidine (PEPCID) 20 MG tablet Take 20 mg by mouth 2 (two) times daily.    [provider]  gabapentin (NEURONTIN) 300 MG capsule TAKE 1 CAPSULE(300 MG) BY MOUTH THREE TIMES DAILY Patient taking differently: TAKE 1 CAPSULE (300 MG) BY MOUTH THREE TIMES DAILY 08/14/15   Kirsteins, Luanna Salk, MD  glimepiride (AMARYL) 4 MG tablet Take 4 mg by mouth daily. 10/29/16   [provider]  HYDROcodone-acetaminophen (NORCO) 10-325 MG tablet Take 1 tablet by mouth every 6 (six) hours as needed (pain).  01/06/17   [provider]  ibuprofen (ADVIL,MOTRIN) 200 MG tablet Take 200 mg by mouth every 6 (six) hours as needed for headache (pain).    [provider]  insulin aspart protamine-insulin aspart (NOVOLOG 70/30) (70-30) 100 UNIT/ML injection Inject 50 Units into the skin 2 (two) times daily.     [provider]  lisinopril-hydrochlorothiazide (PRINZIDE,ZESTORETIC) 20-12.5 MG tablet Take 1 tablet by mouth daily.    [provider]  metFORMIN (GLUCOPHAGE) 1000 MG tablet Take 1,000 mg by mouth 2 (two) times daily with a meal.    [provider]  methocarbamol (ROBAXIN) 500 MG tablet Take 1 tablet (500 mg total) by mouth 2 (two) times daily. 05/17/16   Muthersbaugh, Jarrett Soho, PA-C  misoprostol (CYTOTEC) 200 MCG tablet TAKE 1 TABLET BY MOUTH TWICE DAILY Patient not taking: Reported on 05/16/2016 09/16/15   Charlett Blake, MD  mometasone (NASONEX) 50 MCG/ACT nasal spray Place 1 spray into the nose 2 (two) times daily.    [provider]  Multiple Vitamin (MULTIVITAMIN) tablet Take 1 tablet by mouth daily.    [provider]  oxyCODONE-acetaminophen (PERCOCET/ROXICET) 5-325 MG tablet Take 1  tablet by mouth every 8 (eight) hours as needed for severe pain. 01/27/16   Shary Decamp, PA-C  tolterodine (DETROL) 2 MG tablet Take 0.5 tablets (1 mg total) by mouth 2 (two) times daily. Patient not taking: Reported on 05/16/2016 05/07/15   Charlett Blake, MD    Family History Family History  Problem Relation Age of Onset  . Prostate cancer Father    Social History Social History   Tobacco Use  . Smoking status: Never Smoker  . Smokeless tobacco: Never Used  Substance Use Topics  . Alcohol use: No  . Drug use: No   Allergies   Aspirin; Hydrochlorothiazide; Ibuprofen; Latex; and Tramadol  Review of Systems Review of Systems  Constitutional: Negative for chills and fever.  HENT: Negative for ear pain and sore throat.   Eyes: Negative for pain and visual disturbance.  Respiratory: Negative for cough and shortness of breath.   Cardiovascular: Negative for chest pain and palpitations.  Gastrointestinal: Negative for abdominal pain and vomiting.  Genitourinary: Positive for dysuria  and urgency. Negative for hematuria.  Musculoskeletal: Negative for arthralgias and back pain.  Skin: Negative for color change and rash.  Neurological: Positive for weakness. Negative for seizures and syncope.  Psychiatric/Behavioral: Positive for confusion.  All other systems reviewed and are negative.  Physical Exam Updated Vital Signs BP (!) 177/112 (BP Location: Left Arm)   Pulse 100   Temp 98.7 F (37.1 C) (Oral)   Resp 18   Ht 5\' 5"  (1.651 m)   Wt 90.7 kg (200 lb)   SpO2 100%   BMI 33.28 kg/m   Physical Exam  Constitutional: She appears well-developed and well-nourished. No distress.  HENT:  Head: Normocephalic and atraumatic.  Eyes: Conjunctivae and EOM are normal. Pupils are equal, round, and reactive to light.  Neck: Neck supple.  Cardiovascular: Normal rate and regular rhythm.  No murmur heard. Pulmonary/Chest: Effort normal and breath sounds normal. No respiratory  distress.  Abdominal: Soft. Normal appearance and bowel sounds are normal. She exhibits no distension. There is tenderness (left side diffusely).  Musculoskeletal: She exhibits no edema.  Neurological: She is alert.  Skin: Skin is warm and dry.  Psychiatric: She has a normal mood and affect.  Nursing note and vitals reviewed.  ED Treatments / Results  Labs (all labs ordered are listed, but only abnormal results are displayed) Labs Reviewed  COMPREHENSIVE METABOLIC PANEL - Abnormal; Notable for the following components:      Result Value   Glucose, Bld 337 (*)    Calcium 10.7 (*)    ALT 13 (*)    All other components within normal limits  URINALYSIS, ROUTINE W REFLEX MICROSCOPIC - Abnormal; Notable for the following components:   Specific Gravity, Urine >1.046 (*)    Glucose, UA >=500 (*)    Squamous Epithelial / LPF 0-5 (*)    All other components within normal limits  ACETAMINOPHEN LEVEL - Abnormal; Notable for the following components:   Acetaminophen (Tylenol), Serum <10 (*)    All other components within normal limits  I-STAT CG4 LACTIC ACID, ED - Abnormal; Notable for the following components:   Lactic Acid, Venous 2.20 (*)    All other components within normal limits  CBG MONITORING, ED - Abnormal; Notable for the following components:   Glucose-Capillary 314 (*)    All other components within normal limits  CULTURE, BLOOD (ROUTINE X 2)  CULTURE, BLOOD (ROUTINE X 2)  LIPASE, BLOOD  CBC  AMMONIA  RAPID URINE DRUG SCREEN, HOSP PERFORMED  ETHANOL  SALICYLATE LEVEL  PARATHYROID HORMONE, INTACT (NO CA)  BASIC METABOLIC PANEL  CBC  GLUCOSE, CAPILLARY  I-STAT CG4 LACTIC ACID, ED  CBG MONITORING, ED   EKG  EKG Interpretation  Date/Time:  Thursday July 15 2017 15:53:49 EST Ventricular Rate:  106 PR Interval:  156 QRS Duration: 124 QT Interval:  368 QTC Calculation: 488 R Axis:   -87 Text Interpretation:  Sinus tachycardia Right bundle branch block Left  anterior fascicular block, Bifascicular block : Possible Lateral infarct , age undetermined Abnormal ECG tachycardia new from previous, T wave inversion V3 Confirmed by Theotis Burrow [phone]) on 07/15/2017 4:50:37 PM      Radiology Ct Head Wo Contrast  Result Date: 07/15/2017 CLINICAL DATA:  Mental status changes.  Disorientation. EXAM: CT HEAD WITHOUT CONTRAST TECHNIQUE: Contiguous axial images were obtained from the base of the skull through the vertex without intravenous contrast. COMPARISON:  None. FINDINGS: Brain: There is no evidence for acute hemorrhage, hydrocephalus, mass lesion, or abnormal extra-axial fluid  collection. No definite CT evidence for acute infarction. Vascular: No hyperdense vessel or unexpected calcification. Skull: No evidence for fracture. No worrisome lytic or sclerotic lesion. Sinuses/Orbits: The visualized paranasal sinuses and mastoid air cells are clear. Visualized portions of the globes and intraorbital fat are unremarkable. Other: None. IMPRESSION: No acute intracranial abnormality. Electronically Signed   By: Misty Stanley M.D.   On: 07/15/2017 18:24   Ct Chest W Contrast  Result Date: 07/15/2017 CLINICAL DATA:  71 year old female with history of lung cancer presenting with left flank pain. History of colonic polyp. EXAM: CT CHEST, ABDOMEN, AND PELVIS WITH CONTRAST TECHNIQUE: Multidetector CT imaging of the chest, abdomen and pelvis was performed following the standard protocol during bolus administration of intravenous contrast. CONTRAST:  162mL ISOVUE-300 IOPAMIDOL (ISOVUE-300) INJECTION 61% COMPARISON:  Chest radiograph dated 01/09/2017 and CT dated 05/17/2016 FINDINGS: CT CHEST FINDINGS Cardiovascular: There is no cardiomegaly or pericardial effusion. There is mild atherosclerotic calcification of the thoracic aorta. No aneurysmal dilatation or evidence of dissection. The origins of the great vessels of the aortic arch appear patent. The main pulmonary trunk and  central pulmonary arteries appear unremarkable. Mediastinum/Nodes: There is no hilar or mediastinal adenopathy. The esophagus and the thyroid gland are grossly unremarkable. Lungs/Pleura: There is minimal bronchiectatic changes of the lower lobes. There is a 3 mm nodular density versus a vessel on end in the right upper lobe (series 6, image 62. A faint 7 mm ground-glass focus in the right lower lobe medially (series 6 image 89 and series 7, image 95) most likely an area of scarring and residual from ground-glass density noted in this area on the prior CT. Areas of subpleural scarring noted more anteriorly as seen on the prior CT. There is no focal consolidation, pleural effusion, or pneumothorax. The central airways are patent. Musculoskeletal: There is focal area of skin thickening in the upper back at the level of T2-T3 vertebra. There is a 12 x 12 mm low attenuating collection with induration and thickening of the surrounding subcutaneous soft tissues in this area in the midline. Similar skin thickening was seen on the prior CT. However, the fluid collection is new since the prior study. If there has been recent incision or biopsy findings may represent postprocedural seroma otherwise findings are concerning for an abscess. Correlation with clinical exam recommended. Ultrasound may provide better evaluation of this fluid collection. Left rotator cuff anchor pins noted. There is degenerative changes of the spine. No acute osseous pathology. CT ABDOMEN PELVIS FINDINGS There is no intra-abdominal free air or free fluid. Hepatobiliary: No focal liver abnormality is seen. No gallstones, gallbladder wall thickening, or biliary dilatation. Pancreas: Unremarkable. No pancreatic ductal dilatation or surrounding inflammatory changes. Spleen: Normal in size without focal abnormality. Adrenals/Urinary Tract: The adrenal glands are unremarkable. The kidneys, visualized ureters, and urinary bladder appear unremarkable as  well. There is symmetric enhancement and excretion of contrast by both kidneys. Stomach/Bowel: There is moderate amount of stool throughout the colon. There is no bowel obstruction or active inflammation. Nondistended fecalized loops of distal small bowel noted which may represent increased transit time or small intestine bacterial overgrowth. Clinical correlation is recommended. There are small scattered colonic diverticula without active inflammatory changes. No evidence of bowel obstruction. Normal appendix. Vascular/Lymphatic: Mild aortoiliac atherosclerotic disease. The origins of the celiac axis, SMA, IMA appear patent. No portal venous gas. There is no adenopathy. Reproductive: The uterus is retroflexed. There multiple uterine fibroids with the largest measuring 2.5 cm exophytic from the posterior  body of the uterus. The ovaries are grossly unremarkable as visualized. Other: There is a 2.3 cm supraumbilical peritoneal defect with fat containing hernia. No fluid collection or inflammatory changes. Musculoskeletal: There is degenerative changes of the spine. There is a transitional vertebra. There is grade 1 L5-S1 anterolisthesis. No acute osseous pathology. IMPRESSION: 1. Skin thickening and induration of the underlying subcutaneous soft tissues of the upper back in the midline at the level of T2/T3 with a small loculated fluid collection which may represent a postprocedural seroma or an abscess. Clinical correlation is recommended. Ultrasound may provide better evaluation of the fluid collection. 2. No acute intrathoracic, abdominal, or pelvic pathology. 3. A 7 mm ground-glass focus in the medial basal segment of the right lower lobe, likely residual scarring from prior CT. Overall there has been interval improvement of the ground-glass densities noted over this area on the prior CT. 4. No adenopathy or evidence of metastatic disease. 5. Scattered colonic diverticula without active inflammatory changes. No  bowel obstruction. Normal appendix. 6.  Aortic Atherosclerosis (ICD10-I70.0). Electronically Signed   By: Anner Crete M.D.   On: 07/15/2017 20:21   Ct Abdomen Pelvis W Contrast  Result Date: 07/15/2017 CLINICAL DATA:  71 year old female with history of lung cancer presenting with left flank pain. History of colonic polyp. EXAM: CT CHEST, ABDOMEN, AND PELVIS WITH CONTRAST TECHNIQUE: Multidetector CT imaging of the chest, abdomen and pelvis was performed following the standard protocol during bolus administration of intravenous contrast. CONTRAST:  120mL ISOVUE-300 IOPAMIDOL (ISOVUE-300) INJECTION 61% COMPARISON:  Chest radiograph dated 01/09/2017 and CT dated 05/17/2016 FINDINGS: CT CHEST FINDINGS Cardiovascular: There is no cardiomegaly or pericardial effusion. There is mild atherosclerotic calcification of the thoracic aorta. No aneurysmal dilatation or evidence of dissection. The origins of the great vessels of the aortic arch appear patent. The main pulmonary trunk and central pulmonary arteries appear unremarkable. Mediastinum/Nodes: There is no hilar or mediastinal adenopathy. The esophagus and the thyroid gland are grossly unremarkable. Lungs/Pleura: There is minimal bronchiectatic changes of the lower lobes. There is a 3 mm nodular density versus a vessel on end in the right upper lobe (series 6, image 62. A faint 7 mm ground-glass focus in the right lower lobe medially (series 6 image 89 and series 7, image 95) most likely an area of scarring and residual from ground-glass density noted in this area on the prior CT. Areas of subpleural scarring noted more anteriorly as seen on the prior CT. There is no focal consolidation, pleural effusion, or pneumothorax. The central airways are patent. Musculoskeletal: There is focal area of skin thickening in the upper back at the level of T2-T3 vertebra. There is a 12 x 12 mm low attenuating collection with induration and thickening of the surrounding  subcutaneous soft tissues in this area in the midline. Similar skin thickening was seen on the prior CT. However, the fluid collection is new since the prior study. If there has been recent incision or biopsy findings may represent postprocedural seroma otherwise findings are concerning for an abscess. Correlation with clinical exam recommended. Ultrasound may provide better evaluation of this fluid collection. Left rotator cuff anchor pins noted. There is degenerative changes of the spine. No acute osseous pathology. CT ABDOMEN PELVIS FINDINGS There is no intra-abdominal free air or free fluid. Hepatobiliary: No focal liver abnormality is seen. No gallstones, gallbladder wall thickening, or biliary dilatation. Pancreas: Unremarkable. No pancreatic ductal dilatation or surrounding inflammatory changes. Spleen: Normal in size without focal abnormality. Adrenals/Urinary Tract: The  adrenal glands are unremarkable. The kidneys, visualized ureters, and urinary bladder appear unremarkable as well. There is symmetric enhancement and excretion of contrast by both kidneys. Stomach/Bowel: There is moderate amount of stool throughout the colon. There is no bowel obstruction or active inflammation. Nondistended fecalized loops of distal small bowel noted which may represent increased transit time or small intestine bacterial overgrowth. Clinical correlation is recommended. There are small scattered colonic diverticula without active inflammatory changes. No evidence of bowel obstruction. Normal appendix. Vascular/Lymphatic: Mild aortoiliac atherosclerotic disease. The origins of the celiac axis, SMA, IMA appear patent. No portal venous gas. There is no adenopathy. Reproductive: The uterus is retroflexed. There multiple uterine fibroids with the largest measuring 2.5 cm exophytic from the posterior body of the uterus. The ovaries are grossly unremarkable as visualized. Other: There is a 2.3 cm supraumbilical peritoneal defect  with fat containing hernia. No fluid collection or inflammatory changes. Musculoskeletal: There is degenerative changes of the spine. There is a transitional vertebra. There is grade 1 L5-S1 anterolisthesis. No acute osseous pathology. IMPRESSION: 1. Skin thickening and induration of the underlying subcutaneous soft tissues of the upper back in the midline at the level of T2/T3 with a small loculated fluid collection which may represent a postprocedural seroma or an abscess. Clinical correlation is recommended. Ultrasound may provide better evaluation of the fluid collection. 2. No acute intrathoracic, abdominal, or pelvic pathology. 3. A 7 mm ground-glass focus in the medial basal segment of the right lower lobe, likely residual scarring from prior CT. Overall there has been interval improvement of the ground-glass densities noted over this area on the prior CT. 4. No adenopathy or evidence of metastatic disease. 5. Scattered colonic diverticula without active inflammatory changes. No bowel obstruction. Normal appendix. 6.  Aortic Atherosclerosis (ICD10-I70.0). Electronically Signed   By: Anner Crete M.D.   On: 07/15/2017 20:21   Procedures Procedures (including critical care time)  Medications Ordered in ED Medications  0.9 %  sodium chloride infusion ( Intravenous Transfusing/Transfer 07/16/17 0102)  acetaminophen (TYLENOL) tablet 650 mg (not administered)  amLODipine (NORVASC) tablet 5 mg (not administered)  aspirin EC tablet 81 mg (not administered)  famotidine (PEPCID) tablet 20 mg (20 mg Oral Given 07/16/17 0059)  multivitamin with minerals tablet 1 tablet (not administered)  HYDROcodone-acetaminophen (NORCO/VICODIN) 5-325 MG per tablet 1 tablet (not administered)  lisinopril (PRINIVIL,ZESTRIL) tablet 20 mg (not administered)  insulin glargine (LANTUS) injection 5 Units (not administered)  insulin aspart (novoLOG) injection 0-9 Units (not administered)  albuterol (PROVENTIL) (2.5 MG/3ML)  0.083% nebulizer solution 2.5 mg (not administered)  enoxaparin (LOVENOX) injection 40 mg (not administered)  ondansetron (ZOFRAN) tablet 4 mg (not administered)    Or  ondansetron (ZOFRAN) injection 4 mg (not administered)  hydrALAZINE (APRESOLINE) injection 5 mg (not administered)  zolpidem (AMBIEN) tablet 5 mg (not administered)  azelastine (ASTELIN) 0.1 % nasal spray 2 spray (2 sprays Each Nare Given 07/16/17 0146)    And  fluticasone (FLONASE) 50 MCG/ACT nasal spray 2 spray (2 sprays Each Nare Given 07/16/17 0146)  sodium chloride 0.9 % bolus 1,000 mL (0 mLs Intravenous Stopped 07/15/17 2040)  iopamidol (ISOVUE-300) 61 % injection (100 mLs  Contrast Given 07/15/17 1748)  hydrOXYzine (ATARAX/VISTARIL) tablet 25 mg (25 mg Oral Given 07/15/17 2130)   Initial Impression / Assessment and Plan / ED Course  I have reviewed the triage vital signs and the nursing notes.  Pertinent labs & imaging results that were available during my care of the patient were reviewed  by me and considered in my medical decision making (see chart for details).  Pt is a 71 y.o. female with  pertinent PMHX HTN, DM, and HLD who presents w/ altered mental status and urinary incontinence.  Lab studies and imaging ordered Labs remarkable for: hyperglycemia glucose 314, glucosuria, UA without evidence of UTI, elevated lactic acid of 2.20 Imaging: No acute intrathoracic, abdominal, or pelvic pathology  Other possible causes for the patient's symptoms that were considered include infectious causes (encephalopathy, UTI, pneumonia), tumor/abscess, toxic causes (hypoglycemia, renal failure, hepatic failure, toxic ingestion), other neurologic disorders (TIA, CA, normal pressure hydrocephalous), and trauma (ICH, SAH, SDH).   Labs and imaging reviewed by myself and considered in medical decision making if ordered.  Imaging interpreted by radiology.  Discussed admission with the hospitalist due to rapid onset altered mental  status and confusion in a patient without history of dementia.   Patient admitted to hospitalist in stable condition.   Pt was discussed with my attending, Dr. Rex Kras.  Final Clinical Impressions(s) / ED Diagnoses   Final diagnoses:  Transient alteration of awareness  Confusion  Loss of weight  Failure to thrive in adult   ED Discharge Orders    None       Fenton Foy, MD 07/17/17 0024    Rex Kras Wenda [place], MD 07/19/17 [phone]

## 2017-07-15 NOTE — ED Notes (Signed)
Patient transported to CT 

## 2017-07-15 NOTE — H&P (Signed)
History and Physical    Katelyn [name] LPF:[id number] DOB: 1946-02-18 DOA: 07/15/2017  Referring MD/NP/PA:   PCP: Mack Hook, MD   Patient coming from:  The patient is coming from home.  At baseline, pt is partially dependent for most of ADL.   Chief Complaint: AMS and lower abdominal pain  HPI: Katelyn [name] is a 71 y.o. female with medical history significant of hypertension, hyperlipidemia, diabetes mellitus, asthma, GERD, OSA not on CPAP, obesity, hyperparathyroidism, chronic pain syndrome, who presents with altered mental status and lower abdominal pain.  Patient has AMS and is a poor historian.The history is obtained by asking pt and also by discussing the case with ED physician, per EMS report, and with the nursing staff. Per report, patient is normally independent and still able to drive. Pt's sister reported to EDP that over the past 4 weeks the patient has been confused. She repeatedly asks the same question. Pt was noted to had been urinating on herself. Both the sister and the patient denies any recent falls. Pt states that she has some lower abdominal pain, but cannot characterize the pain did tell. No nausea vomiting or diarrhea. Patient does not have fever or chills. Patient denies any chest pain, shortness breath, cough. She moves all extremities normally. No slurred speech, facial droop noted. Pt has a small boil-like bump in the mid upper back.  ED Course: pt was found to have WBC 6.4, Ca 10.7, negative urinalysis, lactic acid 1.05, INR 1.05, negative UDS, lipase 32, Tylenol less than 10, salicylate level less than 7, alcohol level less than 10, ammonia level 35, electrolytes renal function okay, temperature normal, tachycardia, oxygen saturation 98% on room air. CT head is negative for acute intracranial abnormalities. Pt is placed on tele bed for obs.  CT of abdomen and CT of chest: 1. Skin thickening and induration of the underlying subcutaneous soft tissues  of the upper back in the midline at the level of T2/T3 with a small loculated fluid collection which may represent a postprocedural seroma or an abscess.  2. No acute intrathoracic, abdominal, or pelvic pathology. 3. A 7 mm ground-glass focus in the medial basal segment of the right lower lobe, likely residual scarring from prior CT. Overall there has been interval improvement of the ground-glass densities noted over this area on the prior CT.  Review of Systems:   General: no fevers, chills, no body weight gain, has poor appetite, has fatigue HEENT: no blurry vision, hearing changes or sore throat Respiratory: no dyspnea, coughing, wheezing CV: no chest pain, no palpitations GI: no nausea, vomiting, has abdominal pain, no diarrhea, constipation GU: no dysuria, burning on urination, increased urinary frequency, hematuria  Ext: no leg edema Neuro: no unilateral weakness, numbness, or tingling, no vision change or hearing loss. Has AMS. Skin: has a small bump in mid upper back MSK: No muscle spasm, no deformity, no limitation of range of movement in spin Heme: No easy bruising.  Travel history: No recent long distant travel.  Allergy:  Allergies  Allergen Reactions  . Aspirin Other (See Comments)    Stomach irritation  . Hydrochlorothiazide     REACTION: 2 trials of HCTZ with pt intolerance secondary to dizzines  . Ibuprofen Other (See Comments)     Causes "shakiness"  . Latex Itching    Irritates, Redness  . Tramadol     Stomach upset    Past Medical History:  Diagnosis Date  . Allergic rhinitis   .  Asthma   . Chronic pain syndrome   . Colon polyp   . Diabetes mellitus   . H. pylori infection   . Hyperlipidemia   . Hyperparathyroidism ([place])   . Hypertension   . Hypertensive heart disease without heart failure   . Morbid obesity ([place])   . Sleep apnea   . Vitamin D deficiency     Past Surgical History:  Procedure Laterality Date  . JOINT REPLACEMENT      Social  History:  reports that  has never smoked. she has never used smokeless tobacco. She reports that she does not drink alcohol or use drugs.  Family History:  Family History  Problem Relation Age of Onset  . Prostate cancer Father      Prior to Admission medications   Medication Sig Start Date End Date Taking? Authorizing Provider  acetaminophen (TYLENOL) 500 MG tablet Take 1,000 mg by mouth 2 (two) times daily as needed for moderate pain.    [provider]  albuterol (PROVENTIL HFA;VENTOLIN HFA) 108 (90 BASE) MCG/ACT inhaler Inhale 2 puffs into the lungs daily as needed for wheezing.     [provider]  amLODipine (NORVASC) 5 MG tablet Take 5 mg by mouth daily.    [provider]  aspirin EC 81 MG tablet Take 81 mg by mouth daily.     [provider]  Azelastine-Fluticasone (DYMISTA) 137-50 MCG/ACT SUSP Place 2 sprays into both nostrils at bedtime. Patient not taking: Reported on 07/15/2017 06/26/13   Baird Lyons D, MD  diclofenac sodium (VOLTAREN) 1 % GEL Apply 1 application topically 2 (two) times daily. 10/30/16   [provider]  Diclofenac-Misoprostol 75-0.2 MG TBEC Take 1 tablet by mouth 2 (two) times daily.  05/09/16   [provider]  exenatide (BYETTA) 5 MCG/0.02ML SOPN injection Inject 5 mcg into the skin 2 (two) times daily with a meal.    [provider]  famotidine (PEPCID) 20 MG tablet Take 20 mg by mouth 2 (two) times daily.    [provider]  gabapentin (NEURONTIN) 300 MG capsule TAKE 1 CAPSULE(300 MG) BY MOUTH THREE TIMES DAILY Patient not taking: Reported on 07/15/2017 08/14/15   Charlett Blake, MD  glimepiride (AMARYL) 4 MG tablet Take 4 mg by mouth daily. 10/29/16   [provider]  HYDROcodone-acetaminophen (NORCO) 10-325 MG tablet Take 1 tablet by mouth every 6 (six) hours as needed (pain).  01/06/17   [provider]  ibuprofen (ADVIL,MOTRIN) 200 MG tablet Take 200 mg by mouth  every 6 (six) hours as needed for headache (pain).    [provider]  insulin aspart protamine-insulin aspart (NOVOLOG 70/30) (70-30) 100 UNIT/ML injection Inject 50 Units into the skin 2 (two) times daily.     [provider]  lisinopril-hydrochlorothiazide (PRINZIDE,ZESTORETIC) 20-12.5 MG tablet Take 1 tablet by mouth daily.    [provider]  metFORMIN (GLUCOPHAGE) 1000 MG tablet Take 1,000 mg by mouth 2 (two) times daily with a meal.    [provider]  methocarbamol (ROBAXIN) 500 MG tablet Take 1 tablet (500 mg total) by mouth 2 (two) times daily. Patient not taking: Reported on 07/15/2017 05/17/16   Muthersbaugh, Jarrett Soho, PA-C  misoprostol (CYTOTEC) 200 MCG tablet TAKE 1 TABLET BY MOUTH TWICE DAILY Patient not taking: Reported on 05/16/2016 09/16/15   Charlett Blake, MD  mometasone (NASONEX) 50 MCG/ACT nasal spray Place 1 spray into the nose 2 (two) times daily.    [provider]  Multiple Vitamin (MULTIVITAMIN) tablet Take 1 tablet by mouth daily.    [provider]  oxyCODONE-acetaminophen (PERCOCET/ROXICET) 5-325 MG tablet Take 1 tablet by mouth every 8 (eight) hours as needed for severe pain. Patient not taking: Reported on 07/15/2017 01/27/16   Shary Decamp, PA-C  tolterodine (DETROL) 2 MG tablet Take 0.5 tablets (1 mg total) by mouth 2 (two) times daily. Patient not taking: Reported on 05/16/2016 05/07/15   Charlett Blake, MD    Physical Exam: Vitals:   07/15/17 2200 07/15/17 2230 07/15/17 2302 07/15/17 2330  BP: (!) 167/92 (!) 165/95 (!) 157/75 (!) 167/74  Pulse: 99 (!) 103 95 91  Resp: 20 15 13 13   Temp:      TempSrc:      SpO2: 100% 100% 100% 98%  Weight:      Height:       General: Not in acute distress HEENT:       Eyes: PERRL, EOMI, no scleral icterus.       ENT: No discharge from the ears and nose, no pharynx injection, no tonsillar enlargement.        Neck: No JVD, no bruit, no mass felt. Heme: No neck  lymph node enlargement. Cardiac: S1/S2, RRR, No murmurs, No gallops or rubs. Respiratory:  No rales, wheezing, rhonchi or rubs. GI: Soft, nondistended, has tenderness in lower abdomen, no rebound pain, no organomegaly, BS present. GU: No hematuria Ext: No pitting leg edema bilaterally. 2+DP/PT pulse bilaterally. Musculoskeletal: No joint deformities, No joint redness or warmth, no limitation of ROM in spin. Skin: has small boil-like bump, crusted, dark colored, approximately 11.5 cm in size Neuro: Confused, know her own name, knows that she is in hospital, not oriented to time. Cranial nerves II-XII grossly intact, moves all extremities normally.  Psych: Patient is not psychotic, no suicidal or hemocidal ideation.  Labs on Admission: I have personally reviewed following labs and imaging studies  CBC: Recent Labs  Lab 07/15/17 1611  WBC 6.4  HGB 12.8  HCT 38.8  MCV 83.1  PLT 782   Basic Metabolic Panel: Recent Labs  Lab 07/15/17 1611  NA 135  K 3.8  CL 105  CO2 23  GLUCOSE 337*  BUN 15  CREATININE 0.63  CALCIUM 10.7*   GFR: Estimated Creatinine Clearance: 72.8 mL/min (by C-G formula based on SCr of 0.63 mg/dL). Liver Function Tests: Recent Labs  Lab 07/15/17 1611  AST 17  ALT 13*  ALKPHOS 102  BILITOT 0.6  PROT 8.0  ALBUMIN 3.7   Recent Labs  Lab 07/15/17 1611  LIPASE 32   Recent Labs  Lab 07/15/17 1907  AMMONIA 35   Coagulation Profile: No results for input(s): INR, PROTIME in the last 168 hours. Cardiac Enzymes: No results for input(s): CKTOTAL, CKMB, CKMBINDEX, TROPONINI in the last 168 hours. BNP (last 3 results) No results for input(s): PROBNP in the last 8760 hours. HbA1C: No results for input(s): HGBA1C in the last 72 hours. CBG: Recent Labs  Lab 07/15/17 1559 07/15/17 2327  GLUCAP 314* 83   Lipid Profile: No results for input(s): CHOL, HDL, LDLCALC, TRIG, CHOLHDL, LDLDIRECT in the last 72 hours. Thyroid Function Tests: No results  for input(s): TSH, T4TOTAL, FREET4, T3FREE, THYROIDAB in the last 72 hours. Anemia Panel: No results for input(s): VITAMINB12, FOLATE, FERRITIN, TIBC, IRON, RETICCTPCT in the last 72 hours. Urine analysis:    Component Value Date/Time   COLORURINE YELLOW 07/15/2017 2136   APPEARANCEUR CLEAR 07/15/2017 2136   LABSPEC >  1.046 (H) 07/15/2017 2136   PHURINE 6.0 07/15/2017 2136   GLUCOSEU >=500 (A) 07/15/2017 2136   HGBUR NEGATIVE 07/15/2017 2136   HGBUR negative 12/23/2009 1008   BILIRUBINUR NEGATIVE 07/15/2017 2136   KETONESUR NEGATIVE 07/15/2017 2136   PROTEINUR NEGATIVE 07/15/2017 2136   UROBILINOGEN 0.2 04/15/2011 0951   NITRITE NEGATIVE 07/15/2017 2136   LEUKOCYTESUR NEGATIVE 07/15/2017 2136   Sepsis Labs: @LABRCNTIP (procalcitonin:4,lacticidven:4) )No results found for this or any previous visit (from the past 240 hour(s)).   Radiological Exams on Admission: Ct Head Wo Contrast  Result Date: 07/15/2017 CLINICAL DATA:  Mental status changes.  Disorientation. EXAM: CT HEAD WITHOUT CONTRAST TECHNIQUE: Contiguous axial images were obtained from the base of the skull through the vertex without intravenous contrast. COMPARISON:  None. FINDINGS: Brain: There is no evidence for acute hemorrhage, hydrocephalus, mass lesion, or abnormal extra-axial fluid collection. No definite CT evidence for acute infarction. Vascular: No hyperdense vessel or unexpected calcification. Skull: No evidence for fracture. No worrisome lytic or sclerotic lesion. Sinuses/Orbits: The visualized paranasal sinuses and mastoid air cells are clear. Visualized portions of the globes and intraorbital fat are unremarkable. Other: None. IMPRESSION: No acute intracranial abnormality. Electronically Signed   By: Misty Stanley M.D.   On: 07/15/2017 18:24   Ct Chest W Contrast  Result Date: 07/15/2017 CLINICAL DATA:  71 year old female with history of lung cancer presenting with left flank pain. History of colonic polyp. EXAM:  CT CHEST, ABDOMEN, AND PELVIS WITH CONTRAST TECHNIQUE: Multidetector CT imaging of the chest, abdomen and pelvis was performed following the standard protocol during bolus administration of intravenous contrast. CONTRAST:  137mL ISOVUE-300 IOPAMIDOL (ISOVUE-300) INJECTION 61% COMPARISON:  Chest radiograph dated 01/09/2017 and CT dated 05/17/2016 FINDINGS: CT CHEST FINDINGS Cardiovascular: There is no cardiomegaly or pericardial effusion. There is mild atherosclerotic calcification of the thoracic aorta. No aneurysmal dilatation or evidence of dissection. The origins of the great vessels of the aortic arch appear patent. The main pulmonary trunk and central pulmonary arteries appear unremarkable. Mediastinum/Nodes: There is no hilar or mediastinal adenopathy. The esophagus and the thyroid gland are grossly unremarkable. Lungs/Pleura: There is minimal bronchiectatic changes of the lower lobes. There is a 3 mm nodular density versus a vessel on end in the right upper lobe (series 6, image 62. A faint 7 mm ground-glass focus in the right lower lobe medially (series 6 image 89 and series 7, image 95) most likely an area of scarring and residual from ground-glass density noted in this area on the prior CT. Areas of subpleural scarring noted more anteriorly as seen on the prior CT. There is no focal consolidation, pleural effusion, or pneumothorax. The central airways are patent. Musculoskeletal: There is focal area of skin thickening in the upper back at the level of T2-T3 vertebra. There is a 12 x 12 mm low attenuating collection with induration and thickening of the surrounding subcutaneous soft tissues in this area in the midline. Similar skin thickening was seen on the prior CT. However, the fluid collection is new since the prior study. If there has been recent incision or biopsy findings may represent postprocedural seroma otherwise findings are concerning for an abscess. Correlation with clinical exam recommended.  Ultrasound may provide better evaluation of this fluid collection. Left rotator cuff anchor pins noted. There is degenerative changes of the spine. No acute osseous pathology. CT ABDOMEN PELVIS FINDINGS There is no intra-abdominal free air or free fluid. Hepatobiliary: No focal liver abnormality is seen. No gallstones, gallbladder wall thickening, or  biliary dilatation. Pancreas: Unremarkable. No pancreatic ductal dilatation or surrounding inflammatory changes. Spleen: Normal in size without focal abnormality. Adrenals/Urinary Tract: The adrenal glands are unremarkable. The kidneys, visualized ureters, and urinary bladder appear unremarkable as well. There is symmetric enhancement and excretion of contrast by both kidneys. Stomach/Bowel: There is moderate amount of stool throughout the colon. There is no bowel obstruction or active inflammation. Nondistended fecalized loops of distal small bowel noted which may represent increased transit time or small intestine bacterial overgrowth. Clinical correlation is recommended. There are small scattered colonic diverticula without active inflammatory changes. No evidence of bowel obstruction. Normal appendix. Vascular/Lymphatic: Mild aortoiliac atherosclerotic disease. The origins of the celiac axis, SMA, IMA appear patent. No portal venous gas. There is no adenopathy. Reproductive: The uterus is retroflexed. There multiple uterine fibroids with the largest measuring 2.5 cm exophytic from the posterior body of the uterus. The ovaries are grossly unremarkable as visualized. Other: There is a 2.3 cm supraumbilical peritoneal defect with fat containing hernia. No fluid collection or inflammatory changes. Musculoskeletal: There is degenerative changes of the spine. There is a transitional vertebra. There is grade 1 L5-S1 anterolisthesis. No acute osseous pathology. IMPRESSION: 1. Skin thickening and induration of the underlying subcutaneous soft tissues of the upper back in the  midline at the level of T2/T3 with a small loculated fluid collection which may represent a postprocedural seroma or an abscess. Clinical correlation is recommended. Ultrasound may provide better evaluation of the fluid collection. 2. No acute intrathoracic, abdominal, or pelvic pathology. 3. A 7 mm ground-glass focus in the medial basal segment of the right lower lobe, likely residual scarring from prior CT. Overall there has been interval improvement of the ground-glass densities noted over this area on the prior CT. 4. No adenopathy or evidence of metastatic disease. 5. Scattered colonic diverticula without active inflammatory changes. No bowel obstruction. Normal appendix. 6.  Aortic Atherosclerosis (ICD10-I70.0). Electronically Signed   By: Anner Crete M.D.   On: 07/15/2017 20:21   Ct Abdomen Pelvis W Contrast  Result Date: 07/15/2017 CLINICAL DATA:  71 year old female with history of lung cancer presenting with left flank pain. History of colonic polyp. EXAM: CT CHEST, ABDOMEN, AND PELVIS WITH CONTRAST TECHNIQUE: Multidetector CT imaging of the chest, abdomen and pelvis was performed following the standard protocol during bolus administration of intravenous contrast. CONTRAST:  162mL ISOVUE-300 IOPAMIDOL (ISOVUE-300) INJECTION 61% COMPARISON:  Chest radiograph dated 01/09/2017 and CT dated 05/17/2016 FINDINGS: CT CHEST FINDINGS Cardiovascular: There is no cardiomegaly or pericardial effusion. There is mild atherosclerotic calcification of the thoracic aorta. No aneurysmal dilatation or evidence of dissection. The origins of the great vessels of the aortic arch appear patent. The main pulmonary trunk and central pulmonary arteries appear unremarkable. Mediastinum/Nodes: There is no hilar or mediastinal adenopathy. The esophagus and the thyroid gland are grossly unremarkable. Lungs/Pleura: There is minimal bronchiectatic changes of the lower lobes. There is a 3 mm nodular density versus a vessel on end  in the right upper lobe (series 6, image 62. A faint 7 mm ground-glass focus in the right lower lobe medially (series 6 image 89 and series 7, image 95) most likely an area of scarring and residual from ground-glass density noted in this area on the prior CT. Areas of subpleural scarring noted more anteriorly as seen on the prior CT. There is no focal consolidation, pleural effusion, or pneumothorax. The central airways are patent. Musculoskeletal: There is focal area of skin thickening in the upper back at the  level of T2-T3 vertebra. There is a 12 x 12 mm low attenuating collection with induration and thickening of the surrounding subcutaneous soft tissues in this area in the midline. Similar skin thickening was seen on the prior CT. However, the fluid collection is new since the prior study. If there has been recent incision or biopsy findings may represent postprocedural seroma otherwise findings are concerning for an abscess. Correlation with clinical exam recommended. Ultrasound may provide better evaluation of this fluid collection. Left rotator cuff anchor pins noted. There is degenerative changes of the spine. No acute osseous pathology. CT ABDOMEN PELVIS FINDINGS There is no intra-abdominal free air or free fluid. Hepatobiliary: No focal liver abnormality is seen. No gallstones, gallbladder wall thickening, or biliary dilatation. Pancreas: Unremarkable. No pancreatic ductal dilatation or surrounding inflammatory changes. Spleen: Normal in size without focal abnormality. Adrenals/Urinary Tract: The adrenal glands are unremarkable. The kidneys, visualized ureters, and urinary bladder appear unremarkable as well. There is symmetric enhancement and excretion of contrast by both kidneys. Stomach/Bowel: There is moderate amount of stool throughout the colon. There is no bowel obstruction or active inflammation. Nondistended fecalized loops of distal small bowel noted which may represent increased transit time or  small intestine bacterial overgrowth. Clinical correlation is recommended. There are small scattered colonic diverticula without active inflammatory changes. No evidence of bowel obstruction. Normal appendix. Vascular/Lymphatic: Mild aortoiliac atherosclerotic disease. The origins of the celiac axis, SMA, IMA appear patent. No portal venous gas. There is no adenopathy. Reproductive: The uterus is retroflexed. There multiple uterine fibroids with the largest measuring 2.5 cm exophytic from the posterior body of the uterus. The ovaries are grossly unremarkable as visualized. Other: There is a 2.3 cm supraumbilical peritoneal defect with fat containing hernia. No fluid collection or inflammatory changes. Musculoskeletal: There is degenerative changes of the spine. There is a transitional vertebra. There is grade 1 L5-S1 anterolisthesis. No acute osseous pathology. IMPRESSION: 1. Skin thickening and induration of the underlying subcutaneous soft tissues of the upper back in the midline at the level of T2/T3 with a small loculated fluid collection which may represent a postprocedural seroma or an abscess. Clinical correlation is recommended. Ultrasound may provide better evaluation of the fluid collection. 2. No acute intrathoracic, abdominal, or pelvic pathology. 3. A 7 mm ground-glass focus in the medial basal segment of the right lower lobe, likely residual scarring from prior CT. Overall there has been interval improvement of the ground-glass densities noted over this area on the prior CT. 4. No adenopathy or evidence of metastatic disease. 5. Scattered colonic diverticula without active inflammatory changes. No bowel obstruction. Normal appendix. 6.  Aortic Atherosclerosis (ICD10-I70.0). Electronically Signed   By: Anner Crete M.D.   On: 07/15/2017 20:21     EKG: Independently reviewed.  Sinus rhythm, QTC 488, biphasicular block   Assessment/Plan Principal Problem:   Acute metabolic  encephalopathy Active Problems:   HYPERTENSION, BENIGN ESSENTIAL   Asthma   Diabetes mellitus without complication (HCC)   GERD (gastroesophageal reflux disease)   Hyperparathyroidism ([place])   Abdominal pain   Dehydration   Hypercalcemia   Acute metabolic encephalopathy: Etiology is not clear. CT head is negative for acute intracranial abnormalities. Patient does not have focal neurologic findings, does not seem to have stroke. UA negative. Likely due to multifactorial etiology, including hyercalcemia, dehydration, possible delirium. Her UDS is negative, indicating less likely to have pain medication overdose.  -will place on tele bed for obs -IVF: 2L NS, then 125 cc/h -decreas dose  of Norco from 10/325 mg to 5/325 mg every 6 hours. -Frequent neuro check -consult to SW and CM  Hypercalcemia and hx of hyperparathyroidism: Patient calcium is 10.7, which was 11.0 on 01/09/17. Patient is on Prinzide, the side effects of HCTZ may have also contributed partially. -Discontinue Prinzide, switch to lisinopril for hypertension -IV fluid as above -Follow-up by BMP -Check a PTH  Dehydration: Patient is clinically dry. -IV fluid as above  HTN: -switched prinzide to lisinopril -Continue amlodipine -IV hydralazine when necessary  Asthma: stab;e -prn albuterol nebs  GERD: -Pepcid   Diabetes mellitus without complication ([place]): Last A1c 11.1, poorly controled. Patient is taking metformin, byetta and Amaryl at home. Blood sugar is 337 -start Lantus 5 U daily -SSI -Check A1c  Abdominal pain: Etiology is not clear. CT abdomen/pelvis is not impressive. Lipase normal.  -When necessary Norco   Small bump in mid upper back: looks like erupted boil. No fever or chills. No leukocytosis. -Observe now  DVT ppx:  SQ Lovenox Code Status: Full code Family Communication: None at bed side.   Disposition Plan:  Anticipate discharge back to previous home environment Consults called:   none Admission status:  Inpatient/tele     Date of Service 07/16/2017    Ivor Costa [facility] Pager [phone]  If 7PM-7AM, please contact night-coverage [email or web address] Password [email or web address] 07/16/2017, 12:16 AM
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
## 2017-07-15 NOTE — ED Notes (Signed)
Pt noted by family to be pulling off her cardiac leads. This RN able to replace and pt willing to follow commands. Pt seems occupied with itching to her R underarm, though nothing noted to same area.
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
## 2017-07-15 NOTE — ED Notes (Signed)
Pt given sandwich meal, broth, and water to eat & drink. Family member assisting her with eating. Will depart soon after.

## 2017-07-16 DIAGNOSIS — K219 Gastro-esophageal reflux disease without esophagitis: Secondary | ICD-10-CM | POA: Diagnosis present

## 2017-07-16 DIAGNOSIS — Z8042 Family history of malignant neoplasm of prostate: Secondary | ICD-10-CM | POA: Diagnosis not present

## 2017-07-16 DIAGNOSIS — E1143 Type 2 diabetes mellitus with diabetic autonomic (poly)neuropathy: Secondary | ICD-10-CM | POA: Diagnosis present

## 2017-07-16 DIAGNOSIS — E119 Type 2 diabetes mellitus without complications: Secondary | ICD-10-CM

## 2017-07-16 DIAGNOSIS — E785 Hyperlipidemia, unspecified: Secondary | ICD-10-CM | POA: Diagnosis present

## 2017-07-16 DIAGNOSIS — R1012 Left upper quadrant pain: Secondary | ICD-10-CM | POA: Diagnosis not present

## 2017-07-16 DIAGNOSIS — E11319 Type 2 diabetes mellitus with unspecified diabetic retinopathy without macular edema: Secondary | ICD-10-CM | POA: Diagnosis present

## 2017-07-16 DIAGNOSIS — R627 Adult failure to thrive: Secondary | ICD-10-CM

## 2017-07-16 DIAGNOSIS — G9341 Metabolic encephalopathy: Secondary | ICD-10-CM | POA: Diagnosis present

## 2017-07-16 DIAGNOSIS — E1140 Type 2 diabetes mellitus with diabetic neuropathy, unspecified: Secondary | ICD-10-CM | POA: Diagnosis present

## 2017-07-16 DIAGNOSIS — J45909 Unspecified asthma, uncomplicated: Secondary | ICD-10-CM

## 2017-07-16 DIAGNOSIS — E860 Dehydration: Secondary | ICD-10-CM | POA: Diagnosis not present

## 2017-07-16 DIAGNOSIS — Z888 Allergy status to other drugs, medicaments and biological substances status: Secondary | ICD-10-CM | POA: Diagnosis not present

## 2017-07-16 DIAGNOSIS — Z886 Allergy status to analgesic agent status: Secondary | ICD-10-CM | POA: Diagnosis not present

## 2017-07-16 DIAGNOSIS — R634 Abnormal weight loss: Secondary | ICD-10-CM

## 2017-07-16 DIAGNOSIS — Z7982 Long term (current) use of aspirin: Secondary | ICD-10-CM | POA: Diagnosis not present

## 2017-07-16 DIAGNOSIS — Z86010 Personal history of colonic polyps: Secondary | ICD-10-CM | POA: Diagnosis not present

## 2017-07-16 DIAGNOSIS — Z6833 Body mass index (BMI) 33.0-33.9, adult: Secondary | ICD-10-CM | POA: Diagnosis not present

## 2017-07-16 DIAGNOSIS — Z91040 Latex allergy status: Secondary | ICD-10-CM | POA: Diagnosis not present

## 2017-07-16 DIAGNOSIS — E213 Hyperparathyroidism, unspecified: Secondary | ICD-10-CM

## 2017-07-16 DIAGNOSIS — R404 Transient alteration of awareness: Secondary | ICD-10-CM | POA: Diagnosis present

## 2017-07-16 DIAGNOSIS — M1990 Unspecified osteoarthritis, unspecified site: Secondary | ICD-10-CM | POA: Diagnosis present

## 2017-07-16 DIAGNOSIS — K3184 Gastroparesis: Secondary | ICD-10-CM

## 2017-07-16 DIAGNOSIS — I10 Essential (primary) hypertension: Secondary | ICD-10-CM | POA: Diagnosis not present

## 2017-07-16 DIAGNOSIS — G894 Chronic pain syndrome: Secondary | ICD-10-CM | POA: Diagnosis present

## 2017-07-16 DIAGNOSIS — R410 Disorientation, unspecified: Secondary | ICD-10-CM | POA: Diagnosis not present

## 2017-07-16 DIAGNOSIS — E559 Vitamin D deficiency, unspecified: Secondary | ICD-10-CM | POA: Diagnosis present

## 2017-07-16 DIAGNOSIS — M48062 Spinal stenosis, lumbar region with neurogenic claudication: Secondary | ICD-10-CM | POA: Diagnosis present

## 2017-07-16 DIAGNOSIS — G4733 Obstructive sleep apnea (adult) (pediatric): Secondary | ICD-10-CM | POA: Diagnosis present

## 2017-07-16 DIAGNOSIS — Z794 Long term (current) use of insulin: Secondary | ICD-10-CM | POA: Diagnosis not present

## 2017-07-16 DIAGNOSIS — Z885 Allergy status to narcotic agent status: Secondary | ICD-10-CM | POA: Diagnosis not present

## 2017-07-16 LAB — HEMOGLOBIN A1C
Hgb A1c MFr Bld: 9.2 % — ABNORMAL HIGH (ref 4.8–5.6)
Mean Plasma Glucose: 217.34 mg/dL

## 2017-07-16 LAB — CBC
HCT: 36 % (ref 36.0–46.0)
Hemoglobin: 11.8 g/dL — ABNORMAL LOW (ref 12.0–15.0)
MCH: 26.9 pg (ref 26.0–34.0)
MCHC: 32.8 g/dL (ref 30.0–36.0)
MCV: 82.2 fL (ref 78.0–100.0)
Platelets: 237 10*3/uL (ref 150–400)
RBC: 4.38 MIL/uL (ref 3.87–5.11)
RDW: 12.4 % (ref 11.5–15.5)
WBC: 8.1 10*3/uL (ref 4.0–10.5)

## 2017-07-16 LAB — BASIC METABOLIC PANEL
Anion gap: 11 (ref 5–15)
BUN: 15 mg/dL (ref 6–20)
CO2: 18 mmol/L — ABNORMAL LOW (ref 22–32)
Calcium: 10.3 mg/dL (ref 8.9–10.3)
Chloride: 105 mmol/L (ref 101–111)
Creatinine, Ser: 0.6 mg/dL (ref 0.44–1.00)
GFR calc Af Amer: >60 mL/min (ref >60)
GFR calc non Af Amer: >60 mL/min (ref >60)
Glucose, Bld: 21 mg/dL — CL (ref 65–99)
Potassium: 3 mmol/L — ABNORMAL LOW (ref 3.5–5.1)
Sodium: 134 mmol/L — ABNORMAL LOW (ref 135–145)

## 2017-07-16 LAB — GLUCOSE, CAPILLARY
Glucose-Capillary: 117 mg/dL — ABNORMAL HIGH (ref 65–99)
Glucose-Capillary: 61 mg/dL — ABNORMAL LOW (ref 65–99)
Glucose-Capillary: 141 mg/dL — ABNORMAL HIGH (ref 65–99)
Glucose-Capillary: 76 mg/dL (ref 65–99)
Glucose-Capillary: 74 mg/dL (ref 65–99)
Glucose-Capillary: 86 mg/dL (ref 65–99)
Glucose-Capillary: 169 mg/dL — ABNORMAL HIGH (ref 65–99)
Glucose-Capillary: 164 mg/dL — ABNORMAL HIGH (ref 65–99)

## 2017-07-16 LAB — GLUCOSE, RANDOM: Glucose, Bld: 80 mg/dL (ref 65–99)

## 2017-07-16 MED ORDER — AZELASTINE HCL 0.1 % NA SOLN
2.0000 | Freq: Every day | NASAL | Status: DC
  Administered 2017-07-16 (×2): 2 via NASAL
  Filled 2017-07-16: qty 30

## 2017-07-16 MED ORDER — SODIUM CHLORIDE 0.9 % IV BOLUS (SEPSIS)
1000.0000 mL | Freq: Once | INTRAVENOUS | Status: AC
  Administered 2017-07-16: 1000 mL via INTRAVENOUS

## 2017-07-16 MED ORDER — POTASSIUM CHLORIDE 10 MEQ/100ML IV SOLN
INTRAVENOUS | Status: AC
  Administered 2017-07-16: 12:00:00
  Filled 2017-07-16: qty 100

## 2017-07-16 MED ORDER — HYDRALAZINE HCL 20 MG/ML IJ SOLN: 5.0000 mg | INTRAMUSCULAR | Status: DC | PRN

## 2017-07-16 MED ORDER — ALBUTEROL SULFATE (2.5 MG/3ML) 0.083% IN NEBU: 2.5000 mg | INHALATION_SOLUTION | RESPIRATORY_TRACT | Status: DC | PRN

## 2017-07-16 MED ORDER — LISINOPRIL 20 MG PO TABS
20.0000 mg | ORAL_TABLET | Freq: Every day | ORAL | Status: DC
  Administered 2017-07-16 – 2017-07-17 (×2): 20 mg via ORAL
  Filled 2017-07-16 (×2): qty 1

## 2017-07-16 MED ORDER — METOCLOPRAMIDE HCL 5 MG PO TABS
5.0000 mg | ORAL_TABLET | Freq: Three times a day (TID) | ORAL | Status: DC
  Administered 2017-07-16 – 2017-07-17 (×3): 5 mg via ORAL
  Filled 2017-07-16 (×3): qty 1

## 2017-07-16 MED ORDER — HYDROCODONE-ACETAMINOPHEN 5-325 MG PO TABS
1.0000 | ORAL_TABLET | Freq: Four times a day (QID) | ORAL | Status: DC | PRN
  Administered 2017-07-17: 1 via ORAL
  Filled 2017-07-16: qty 1

## 2017-07-16 MED ORDER — INSULIN GLARGINE 100 UNIT/ML ~~LOC~~ SOLN
5.0000 [IU] | Freq: Every day | SUBCUTANEOUS | Status: DC
  Administered 2017-07-17: 5 [IU] via SUBCUTANEOUS
  Filled 2017-07-16 (×2): qty 0.05

## 2017-07-16 MED ORDER — INSULIN ASPART 100 UNIT/ML ~~LOC~~ SOLN
0.0000 [IU] | Freq: Three times a day (TID) | SUBCUTANEOUS | Status: DC
  Administered 2017-07-16: 2 [IU] via SUBCUTANEOUS

## 2017-07-16 MED ORDER — ZOLPIDEM TARTRATE 5 MG PO TABS: 5.0000 mg | ORAL_TABLET | Freq: Every evening | ORAL | Status: DC | PRN

## 2017-07-16 MED ORDER — FLUTICASONE PROPIONATE 50 MCG/ACT NA SUSP
2.0000 | Freq: Every day | NASAL | Status: DC
  Administered 2017-07-16 (×2): 2 via NASAL
  Filled 2017-07-16: qty 16

## 2017-07-16 MED ORDER — POTASSIUM CHLORIDE 10 MEQ/100ML IV SOLN
10.0000 meq | INTRAVENOUS | Status: AC
  Administered 2017-07-16 (×4): 10 meq via INTRAVENOUS
  Filled 2017-07-16 (×3): qty 100

## 2017-07-16 MED ORDER — ENOXAPARIN SODIUM 40 MG/0.4ML ~~LOC~~ SOLN
40.0000 mg | SUBCUTANEOUS | Status: DC
  Administered 2017-07-16: 40 mg via SUBCUTANEOUS
  Filled 2017-07-16: qty 0.4

## 2017-07-16 MED ORDER — ONDANSETRON HCL 4 MG PO TABS: 4.0000 mg | ORAL_TABLET | Freq: Four times a day (QID) | ORAL | Status: DC | PRN

## 2017-07-16 MED ORDER — ONDANSETRON HCL 4 MG/2ML IJ SOLN: 4.0000 mg | Freq: Four times a day (QID) | INTRAMUSCULAR | Status: DC | PRN

## 2017-07-16 MED ORDER — DEXTROSE 50 % IV SOLN
INTRAVENOUS | Status: AC
  Administered 2017-07-16: 25 mL
  Filled 2017-07-16: qty 50

## 2017-07-16 NOTE — Progress Notes (Signed)
Lab glucose ordered for per NP for better CBG accuracy. Potassium IV 4 runs ordered for Potassium of 3.0 per NP.  Will continue to monitor.  Talina Pleitez, RN

## 2017-07-16 NOTE — Progress Notes (Signed)
PROGRESS NOTE  Katelyn [name] bossiere VOH:[id number] DOB: [birth year] DOA: 07/15/2017 PCP: Katelyn Hook, MD  HPI/Recap of past 24 hours: Katelyn [name] bossiere is a 71 y.o. female with medical history significant of hypertension, hyperlipidemia, diabetes mellitus, asthma, GERD, OSA not on CPAP, obesity, hyperparathyroidism, chronic pain syndrome, who presents with altered mental status and left upper abdominal pain. Suspect gastroparesis with no sign of inflammation from scattered colonic diverticuli noted on CT abd pelvis w contrast. U/A (-), CT head (-) acute findings. CT angio chest no evidence of significant pulmonary embolism as reported by radiology.  This morning, patient reports significant abdominal pain left upper quadrant 9/10 sharp, intermittent and non radiating with no improving or worsening factors. A1C 9.2. Denies vomiting or diarrhea, fever or chills.   Assessment/Plan: Principal Problem:   Acute metabolic encephalopathy Active Problems:   HYPERTENSION, BENIGN ESSENTIAL   Asthma   Diabetes mellitus without complication (HCC)   GERD (gastroesophageal reflux disease)   Hyperparathyroidism ([place])   Abdominal pain   Dehydration   Hypercalcemia   Acute metabolic encephalopathy:  - resolved - Etiology is not clear. Likely due to multifactorial etiology, including hyercalcemia, dehydration.  - CT head is negative for acute intracranial abnormalities. - UA negative. Her UDS is negative. - consult to SW and CM for discharge planning  Intractable left upper abdominal pain suspect gastroparesis -left upper Abdominal pain 9/10 sharp -CT abd and pelvis w contrast scattered diverticuli with no sign of inflammation -resume feeding -reglan 5 mg prior to meals TID x 2 days -control blood sugar - A1C 9.2 07/16/17 - lactic acid 1.05 from 2.20 yesterday  Hypercalcemia and hx of hyperparathyroidism: -calcium is 10.7, which was 11.0 on 01/09/17.  -Patient is on Prinzide, the side  effects of HCTZ may have also contributed. -Discontinue Prinzide, switch to lisinopril for hypertension -IV fluid hydration -BMP am  Hypokalemia -k+ 3.0 -replete as indicated -BMP am  Dehydration: -Hypovolemic -IV fluid hydration and encouraged po intake  Normocytic anemia -stable -no sign of obvious bleed -hg 11.8, mcv 82 -CBC am  HTN: -switched prinzide to lisinopril -Continue amlodipine -IV hydralazine prn for SBP>180 or DBP>105  Asthma:  -stable -prn albuterol nebs  GERD: -Pepcid   Diabetes mellitus without complication ([place]):  - Previous A1c 11.1  -A1C 9.2 07/16/17 -poorly controled. Patient is taking metformin, byetta and Amaryl at home.  -Blood sugar is 337 on presentation -labile BS with BS from 337 to 67. Hold off Lantus if BS less than 100. -SSI with hypoglycemia protocol   Soft tissue lipoma vs abscess in mid upper back:  -No fever or chills.  -No leukocytosis. -No sign of acute or active infection -Observe now, follow up with PCP   Code Status: Full   Family Communication: No family member at beside  Disposition Plan: Will stay another midnight until abdominal pain is improved to continue treatment for suspected gastroparesis, hyperglycemia control, and dyselectrolytemia.    Consultants:  None  Procedures:  None  Antimicrobials:  None   DVT prophylaxis:  lovenox 40 mg sq daily   Objective: Vitals:   07/16/17 0000 07/16/17 0030 07/16/17 0150 07/16/17 0536  BP:   (!) 171/82 (!) 162/79  Pulse: 99 89 88 89  Resp: 17 13 16 18   Temp:   98.3 F (36.8 C) 98.2 F (36.8 C)  TempSrc:   Oral Oral  SpO2: 100% 100% 100% 100%  Weight:   92 kg (202 lb 14.4 oz)   Height:   5'  5" (1.651 m)     Intake/Output Summary (Last 24 hours) at 07/16/2017 0736 Last data filed at 07/16/2017 0612 Gross per 24 hour  Intake 2327.08 ml  Output 450 ml  Net 1877.08 ml   Filed Weights   07/15/17 1556 07/16/17 0150  Weight: 90.7 kg (200 lb) 92  kg (202 lb 14.4 oz)    Exam:   General:  71 yo AAF WD WN NAD  Cardiovascular: RRR no rubs or gallops  Respiratory: CTA no wheezes or rales   Abdomen: Soft NT ND NBS x4 quadrants; back has an indurated lesion with dry opening at mid upper back  Musculoskeletal: Moves all limbs, no LE edema  Skin: back has an indurated lesion with dry opening at mid upper back  Psychiatry: Mood is appropriate for condition and setting   Data Reviewed: CBC: Recent Labs  Lab 07/15/17 1611 07/16/17 0212  WBC 6.4 8.1  HGB 12.8 11.8*  HCT 38.8 36.0  MCV 83.1 82.2  PLT 245 599   Basic Metabolic Panel: Recent Labs  Lab 07/15/17 1611 07/16/17 0212  NA 135 134*  K 3.8 3.0*  CL 105 105  CO2 23 18*  GLUCOSE 337* 21*  BUN 15 15  CREATININE 0.63 0.60  CALCIUM 10.7* 10.3   GFR: Estimated Creatinine Clearance: 73.3 mL/min (by C-G formula based on SCr of 0.6 mg/dL). Liver Function Tests: Recent Labs  Lab 07/15/17 1611  AST 17  ALT 13*  ALKPHOS 102  BILITOT 0.6  PROT 8.0  ALBUMIN 3.7   Recent Labs  Lab 07/15/17 1611  LIPASE 32   Recent Labs  Lab 07/15/17 1907  AMMONIA 35   Coagulation Profile: No results for input(s): INR, PROTIME in the last 168 hours. Cardiac Enzymes: No results for input(s): CKTOTAL, CKMB, CKMBINDEX, TROPONINI in the last 168 hours. BNP (last 3 results) No results for input(s): PROBNP in the last 8760 hours. HbA1C: Recent Labs    07/16/17 0401  HGBA1C 9.2*   CBG: Recent Labs  Lab 07/15/17 2327 07/16/17 0141 07/16/17 0315 07/16/17 0339 07/16/17 0647  GLUCAP 83 117* 61* 141* 76   Lipid Profile: No results for input(s): CHOL, HDL, LDLCALC, TRIG, CHOLHDL, LDLDIRECT in the last 72 hours. Thyroid Function Tests: No results for input(s): TSH, T4TOTAL, FREET4, T3FREE, THYROIDAB in the last 72 hours. Anemia Panel: No results for input(s): VITAMINB12, FOLATE, FERRITIN, TIBC, IRON, RETICCTPCT in the last 72 hours. Urine analysis:    Component  Value Date/Time   COLORURINE YELLOW 07/15/2017 2136   APPEARANCEUR CLEAR 07/15/2017 2136   LABSPEC >1.046 (H) 07/15/2017 2136   PHURINE 6.0 07/15/2017 2136   GLUCOSEU >=500 (A) 07/15/2017 2136   HGBUR NEGATIVE 07/15/2017 2136   HGBUR negative 12/23/2009 1008   BILIRUBINUR NEGATIVE 07/15/2017 2136   KETONESUR NEGATIVE 07/15/2017 2136   PROTEINUR NEGATIVE 07/15/2017 2136   UROBILINOGEN 0.2 04/15/2011 0951   NITRITE NEGATIVE 07/15/2017 2136   LEUKOCYTESUR NEGATIVE 07/15/2017 2136   Sepsis Labs: @LABRCNTIP (procalcitonin:4,lacticidven:4)  )No results found for this or any previous visit (from the past 240 hour(s)).    Studies: Ct Head Wo Contrast  Result Date: 07/15/2017 CLINICAL DATA:  Mental status changes.  Disorientation. EXAM: CT HEAD WITHOUT CONTRAST TECHNIQUE: Contiguous axial images were obtained from the base of the skull through the vertex without intravenous contrast. COMPARISON:  None. FINDINGS: Brain: There is no evidence for acute hemorrhage, hydrocephalus, mass lesion, or abnormal extra-axial fluid collection. No definite CT evidence for acute infarction. Vascular: No hyperdense vessel or  unexpected calcification. Skull: No evidence for fracture. No worrisome lytic or sclerotic lesion. Sinuses/Orbits: The visualized paranasal sinuses and mastoid air cells are clear. Visualized portions of the globes and intraorbital fat are unremarkable. Other: None. IMPRESSION: No acute intracranial abnormality. Electronically Signed   By: Misty Stanley M.D.   On: 07/15/2017 18:24   Ct Chest W Contrast  Result Date: 07/15/2017 CLINICAL DATA:  71 year old female with history of lung cancer presenting with left flank pain. History of colonic polyp. EXAM: CT CHEST, ABDOMEN, AND PELVIS WITH CONTRAST TECHNIQUE: Multidetector CT imaging of the chest, abdomen and pelvis was performed following the standard protocol during bolus administration of intravenous contrast. CONTRAST:  165mL ISOVUE-300  IOPAMIDOL (ISOVUE-300) INJECTION 61% COMPARISON:  Chest radiograph dated 01/09/2017 and CT dated 05/17/2016 FINDINGS: CT CHEST FINDINGS Cardiovascular: There is no cardiomegaly or pericardial effusion. There is mild atherosclerotic calcification of the thoracic aorta. No aneurysmal dilatation or evidence of dissection. The origins of the great vessels of the aortic arch appear patent. The main pulmonary trunk and central pulmonary arteries appear unremarkable. Mediastinum/Nodes: There is no hilar or mediastinal adenopathy. The esophagus and the thyroid gland are grossly unremarkable. Lungs/Pleura: There is minimal bronchiectatic changes of the lower lobes. There is a 3 mm nodular density versus a vessel on end in the right upper lobe (series 6, image 62. A faint 7 mm ground-glass focus in the right lower lobe medially (series 6 image 89 and series 7, image 95) most likely an area of scarring and residual from ground-glass density noted in this area on the prior CT. Areas of subpleural scarring noted more anteriorly as seen on the prior CT. There is no focal consolidation, pleural effusion, or pneumothorax. The central airways are patent. Musculoskeletal: There is focal area of skin thickening in the upper back at the level of T2-T3 vertebra. There is a 12 x 12 mm low attenuating collection with induration and thickening of the surrounding subcutaneous soft tissues in this area in the midline. Similar skin thickening was seen on the prior CT. However, the fluid collection is new since the prior study. If there has been recent incision or biopsy findings may represent postprocedural seroma otherwise findings are concerning for an abscess. Correlation with clinical exam recommended. Ultrasound may provide better evaluation of this fluid collection. Left rotator cuff anchor pins noted. There is degenerative changes of the spine. No acute osseous pathology. CT ABDOMEN PELVIS FINDINGS There is no intra-abdominal free air  or free fluid. Hepatobiliary: No focal liver abnormality is seen. No gallstones, gallbladder wall thickening, or biliary dilatation. Pancreas: Unremarkable. No pancreatic ductal dilatation or surrounding inflammatory changes. Spleen: Normal in size without focal abnormality. Adrenals/Urinary Tract: The adrenal glands are unremarkable. The kidneys, visualized ureters, and urinary bladder appear unremarkable as well. There is symmetric enhancement and excretion of contrast by both kidneys. Stomach/Bowel: There is moderate amount of stool throughout the colon. There is no bowel obstruction or active inflammation. Nondistended fecalized loops of distal small bowel noted which may represent increased transit time or small intestine bacterial overgrowth. Clinical correlation is recommended. There are small scattered colonic diverticula without active inflammatory changes. No evidence of bowel obstruction. Normal appendix. Vascular/Lymphatic: Mild aortoiliac atherosclerotic disease. The origins of the celiac axis, SMA, IMA appear patent. No portal venous gas. There is no adenopathy. Reproductive: The uterus is retroflexed. There multiple uterine fibroids with the largest measuring 2.5 cm exophytic from the posterior body of the uterus. The ovaries are grossly unremarkable as visualized. Other: There  is a 2.3 cm supraumbilical peritoneal defect with fat containing hernia. No fluid collection or inflammatory changes. Musculoskeletal: There is degenerative changes of the spine. There is a transitional vertebra. There is grade 1 L5-S1 anterolisthesis. No acute osseous pathology. IMPRESSION: 1. Skin thickening and induration of the underlying subcutaneous soft tissues of the upper back in the midline at the level of T2/T3 with a small loculated fluid collection which may represent a postprocedural seroma or an abscess. Clinical correlation is recommended. Ultrasound may provide better evaluation of the fluid collection. 2. No  acute intrathoracic, abdominal, or pelvic pathology. 3. A 7 mm ground-glass focus in the medial basal segment of the right lower lobe, likely residual scarring from prior CT. Overall there has been interval improvement of the ground-glass densities noted over this area on the prior CT. 4. No adenopathy or evidence of metastatic disease. 5. Scattered colonic diverticula without active inflammatory changes. No bowel obstruction. Normal appendix. 6.  Aortic Atherosclerosis (ICD10-I70.0). Electronically Signed   By: Anner Crete M.D.   On: 07/15/2017 20:21   Ct Abdomen Pelvis W Contrast  Result Date: 07/15/2017 CLINICAL DATA:  71 year old female with history of lung cancer presenting with left flank pain. History of colonic polyp. EXAM: CT CHEST, ABDOMEN, AND PELVIS WITH CONTRAST TECHNIQUE: Multidetector CT imaging of the chest, abdomen and pelvis was performed following the standard protocol during bolus administration of intravenous contrast. CONTRAST:  159mL ISOVUE-300 IOPAMIDOL (ISOVUE-300) INJECTION 61% COMPARISON:  Chest radiograph dated 01/09/2017 and CT dated 05/17/2016 FINDINGS: CT CHEST FINDINGS Cardiovascular: There is no cardiomegaly or pericardial effusion. There is mild atherosclerotic calcification of the thoracic aorta. No aneurysmal dilatation or evidence of dissection. The origins of the great vessels of the aortic arch appear patent. The main pulmonary trunk and central pulmonary arteries appear unremarkable. Mediastinum/Nodes: There is no hilar or mediastinal adenopathy. The esophagus and the thyroid gland are grossly unremarkable. Lungs/Pleura: There is minimal bronchiectatic changes of the lower lobes. There is a 3 mm nodular density versus a vessel on end in the right upper lobe (series 6, image 62. A faint 7 mm ground-glass focus in the right lower lobe medially (series 6 image 89 and series 7, image 95) most likely an area of scarring and residual from ground-glass density noted in this  area on the prior CT. Areas of subpleural scarring noted more anteriorly as seen on the prior CT. There is no focal consolidation, pleural effusion, or pneumothorax. The central airways are patent. Musculoskeletal: There is focal area of skin thickening in the upper back at the level of T2-T3 vertebra. There is a 12 x 12 mm low attenuating collection with induration and thickening of the surrounding subcutaneous soft tissues in this area in the midline. Similar skin thickening was seen on the prior CT. However, the fluid collection is new since the prior study. If there has been recent incision or biopsy findings may represent postprocedural seroma otherwise findings are concerning for an abscess. Correlation with clinical exam recommended. Ultrasound may provide better evaluation of this fluid collection. Left rotator cuff anchor pins noted. There is degenerative changes of the spine. No acute osseous pathology. CT ABDOMEN PELVIS FINDINGS There is no intra-abdominal free air or free fluid. Hepatobiliary: No focal liver abnormality is seen. No gallstones, gallbladder wall thickening, or biliary dilatation. Pancreas: Unremarkable. No pancreatic ductal dilatation or surrounding inflammatory changes. Spleen: Normal in size without focal abnormality. Adrenals/Urinary Tract: The adrenal glands are unremarkable. The kidneys, visualized ureters, and urinary bladder appear unremarkable  as well. There is symmetric enhancement and excretion of contrast by both kidneys. Stomach/Bowel: There is moderate amount of stool throughout the colon. There is no bowel obstruction or active inflammation. Nondistended fecalized loops of distal small bowel noted which may represent increased transit time or small intestine bacterial overgrowth. Clinical correlation is recommended. There are small scattered colonic diverticula without active inflammatory changes. No evidence of bowel obstruction. Normal appendix. Vascular/Lymphatic: Mild  aortoiliac atherosclerotic disease. The origins of the celiac axis, SMA, IMA appear patent. No portal venous gas. There is no adenopathy. Reproductive: The uterus is retroflexed. There multiple uterine fibroids with the largest measuring 2.5 cm exophytic from the posterior body of the uterus. The ovaries are grossly unremarkable as visualized. Other: There is a 2.3 cm supraumbilical peritoneal defect with fat containing hernia. No fluid collection or inflammatory changes. Musculoskeletal: There is degenerative changes of the spine. There is a transitional vertebra. There is grade 1 L5-S1 anterolisthesis. No acute osseous pathology. IMPRESSION: 1. Skin thickening and induration of the underlying subcutaneous soft tissues of the upper back in the midline at the level of T2/T3 with a small loculated fluid collection which may represent a postprocedural seroma or an abscess. Clinical correlation is recommended. Ultrasound may provide better evaluation of the fluid collection. 2. No acute intrathoracic, abdominal, or pelvic pathology. 3. A 7 mm ground-glass focus in the medial basal segment of the right lower lobe, likely residual scarring from prior CT. Overall there has been interval improvement of the ground-glass densities noted over this area on the prior CT. 4. No adenopathy or evidence of metastatic disease. 5. Scattered colonic diverticula without active inflammatory changes. No bowel obstruction. Normal appendix. 6.  Aortic Atherosclerosis (ICD10-I70.0). Electronically Signed   By: Anner Crete M.D.   On: 07/15/2017 20:21    Scheduled Meds: . amLODipine  5 mg Oral Daily  . aspirin EC  81 mg Oral Daily  . azelastine  2 spray Each Nare QHS   And  . fluticasone  2 spray Each Nare QHS  . enoxaparin (LOVENOX) injection  40 mg Subcutaneous Q24H  . famotidine  20 mg Oral BID  . insulin aspart  0-9 Units Subcutaneous TID WC  . insulin glargine  5 Units Subcutaneous Daily  . lisinopril  20 mg Oral Daily    . multivitamin with minerals  1 tablet Oral Daily    Continuous Infusions: . sodium chloride 125 mL/hr at 07/16/17 0059  . potassium chloride Stopped (07/16/17 0659)     LOS: 0 days     Kayleen Memos, MD [facility] Pager [phone]  If 7PM-7AM, please contact night-coverage [email or web address] Password [facility] 07/16/2017, 7:36 AM

## 2017-07-16 NOTE — Progress Notes (Signed)
Patient arrived and was oriented to the unit. Patient only has a cane at bedside. Was unable to complete admission history due to patient's altered mental status.   Drue Flirt, RN

## 2017-07-16 NOTE — Progress Notes (Addendum)
Hypoglycemic Event  CBG: 21  Treatment: D50 IV 25 mL  Symptoms: None  Follow-up CBG: Time:15 minutes later CBG Result:141  Possible Reasons for Event: Medication regimen        Patient arrived to [facility] CBG checked and showed 117. After that blood was drawn and nurse received call that patient venous glucose is 21. Patient stated that she is fine and that she does feel good. Rechecked CBG with machine CBG 61. Since patient is NPO aministered 25 ml od D50% IV. After medication CBG went up to 141. Notified NP on call and requested order for morning glucose to be again placed to double check patient blood sugar.  Will continue to monitor.      Caid Radin

## 2017-07-16 NOTE — Progress Notes (Signed)
Patient not voiding since arrival to [facility]. Bladder scan done, shows greater than 600 ml of urine. Paged NP on call for I and O cath.   Will continue to monitor.  Mostyn Varnell, RN

## 2017-07-16 NOTE — Progress Notes (Signed)
Pt cath'd I & O for 599 mls of clear light yellow urine. Purewick placed after I & O.

## 2017-07-16 NOTE — Progress Notes (Addendum)
CSW received a call from Junius Finner from [facility] at ph: [phone] and is asking that the CSW assigned to the pt and/or responding to the consult contact her regarding the pt.  CSW called pt's RN on 07/16/17 and asked RN to place a CSW consult  And will leave a CSW handoff requesting responding CSW contact the APS worker either on 07/16/17 or before if pt is D/C'ing and leave a message if pt is D/C'ing so APS will be informed.  Otherwise, please update APS on Monday on pt's condition and disposition.  Please reconsult if future social work needs arise.  CSW signing off, as social work intervention is no longer needed.  Katelyn [name]. Katelyn Fountain, LCSW, LCAS, CSI Clinical Social Worker Ph: [phone]

## 2017-07-16 NOTE — Progress Notes (Signed)
Inpatient Diabetes Program Recommendations  AACE/ADA: New Consensus Statement on Inpatient Glycemic Control (2015)  Target Ranges:  Prepandial:   less than 140 mg/dL      Peak postprandial:   less than 180 mg/dL (1-2 hours)      Critically ill patients:  140 - 180 mg/dL  Results for Katelyn [name], Katelyn [name] (MRN [id number]) as of 07/16/2017 09:35  Ref. Range 07/15/2017 15:59 07/15/2017 23:27 07/16/2017 01:41 07/16/2017 03:15 07/16/2017 03:39 07/16/2017 06:47 07/16/2017 08:15  Glucose-Capillary Latest Ref Range: 65 - 99 mg/dL 314 (H)  70/30 50 units @20 :43 83 117 (H) 61 (L) 141 (H) 76 74   Results for Katelyn [name], Katelyn [name] (MRN [id number]) as of 07/16/2017 09:35  Ref. Range 07/15/2017 16:11 07/16/2017 02:12 07/16/2017 08:01  Glucose Latest Ref Range: 65 - 99 mg/dL 337 (H) 21 (LL) 80   Review of Glycemic Control Diabetes history: DM2 Outpatient Diabetes medications: Amaryl 4 mg daily, 70/30 50 units BID, Metformin 1000 mg BID, Byetta 5 mcg BID Current orders for Inpatient glycemic control: Lantus 5 units daily, Novolog 0-9 units TID with meals  Inpatient Diabetes Program Recommendations: Insulin - Basal: Patient received 70/30 50 units at 20:43 on 07/15/17 and lab glucose noted to be 21 mg/dl at 2:12 am today. Patient is currently ordered Lantus 5 units daily. Please consider increasing Lantus to 9 units (based on 92 kg x 0.1 units) and change frequency to QHS.  Correction (SSI): Please consider ordering Novolog 0-5 units QHS for bedtime correction. HgbA1C: A1C 9.2% on 07/16/17 indicating an average glucose of 217 mg/dl over the past 2-3 months.  Thanks, Barnie Alderman, RN, MSN, CDE Diabetes Coordinator Inpatient Diabetes Program [phone] (Team Pager from 8am to 5pm)

## 2017-07-16 NOTE — Progress Notes (Signed)
Awaiting on Physical Therapy eval for disposition needs; CM following for DCP; B Pennie Rushing [phone]

## 2017-07-17 LAB — COMPREHENSIVE METABOLIC PANEL
ALT: 12 U/L — ABNORMAL LOW (ref 14–54)
AST: 17 U/L (ref 15–41)
Albumin: 2.9 g/dL — ABNORMAL LOW (ref 3.5–5.0)
Alkaline Phosphatase: 78 U/L (ref 38–126)
Anion gap: 7 (ref 5–15)
BUN: 8 mg/dL (ref 6–20)
CO2: 22 mmol/L (ref 22–32)
Calcium: 10.1 mg/dL (ref 8.9–10.3)
Chloride: 108 mmol/L (ref 101–111)
Creatinine, Ser: 0.56 mg/dL (ref 0.44–1.00)
GFR calc Af Amer: >60 mL/min (ref >60)
GFR calc non Af Amer: >60 mL/min (ref >60)
Glucose, Bld: 145 mg/dL — ABNORMAL HIGH (ref 65–99)
Potassium: 3.7 mmol/L (ref 3.5–5.1)
Sodium: 137 mmol/L (ref 135–145)
Total Bilirubin: 0.5 mg/dL (ref 0.3–1.2)
Total Protein: 6.7 g/dL (ref 6.5–8.1)

## 2017-07-17 LAB — GLUCOSE, CAPILLARY
Glucose-Capillary: 134 mg/dL — ABNORMAL HIGH (ref 65–99)
Glucose-Capillary: 193 mg/dL — ABNORMAL HIGH (ref 65–99)

## 2017-07-17 LAB — CBC
HCT: 33.7 % — ABNORMAL LOW (ref 36.0–46.0)
Hemoglobin: 10.6 g/dL — ABNORMAL LOW (ref 12.0–15.0)
MCH: 26 pg (ref 26.0–34.0)
MCHC: 31.5 g/dL (ref 30.0–36.0)
MCV: 82.8 fL (ref 78.0–100.0)
Platelets: 209 10*3/uL (ref 150–400)
RBC: 4.07 MIL/uL (ref 3.87–5.11)
RDW: 12.4 % (ref 11.5–15.5)
WBC: 5.3 10*3/uL (ref 4.0–10.5)

## 2017-07-17 LAB — PARATHYROID HORMONE, INTACT (NO CA): PTH: 85 pg/mL — ABNORMAL HIGH (ref 15–65)

## 2017-07-17 MED ORDER — METOCLOPRAMIDE HCL 5 MG PO TABS: 5.0000 mg | ORAL_TABLET | Freq: Three times a day (TID) | ORAL | 0 refills | Status: DC

## 2017-07-17 MED ORDER — POTASSIUM CHLORIDE ER 10 MEQ PO TBCR: 10.0000 meq | EXTENDED_RELEASE_TABLET | Freq: Every day | ORAL | 0 refills | Status: DC

## 2017-07-17 MED ORDER — POTASSIUM CHLORIDE CRYS ER 20 MEQ PO TBCR: 40.0000 meq | EXTENDED_RELEASE_TABLET | Freq: Once | ORAL | Status: DC

## 2017-07-17 NOTE — Discharge Summary (Addendum)
Discharge Summary  Katelyn [name] bossiere JEH:[id number] DOB: February 21, 1946  PCP: Mack Hook, MD  Admit date: 07/15/2017 Discharge date: 07/17/2017  Time spent: 25 minutes  Recommendations for Outpatient Follow-up:  1. Follow up with your PCP within a week 2. Check your blood sugar before insulin administration 3. Take your medications as prescribed 4. Repeat BMP at PCP appointment to check potassium level 5. Physical therapy with home health 6. Fall precaution  Discharge Diagnoses:  Active Hospital Problems   Diagnosis Date Noted  . Acute metabolic encephalopathy 37/85/8850  . Dehydration 07/16/2017  . Hypercalcemia 07/16/2017  . Diabetic gastroparesis associated with type 2 diabetes mellitus ([place]) 07/16/2017  . Asthma 07/15/2017  . Diabetes mellitus without complication ([place]) 27/74/1287  . GERD (gastroesophageal reflux disease) 07/15/2017  . Hyperparathyroidism ([place]) 07/15/2017  . Abdominal pain 07/15/2017  . HYPERTENSION, BENIGN ESSENTIAL 04/11/2007    Resolved Hospital Problems  No resolved problems to display.    Discharge Condition: Stable  Diet recommendation: Cardiac healthy diabetic diet  Vitals:   07/17/17 0537 07/17/17 1109  BP: 132/88 (!) 152/99  Pulse: 94 93  Resp: 18   Temp: 98.9 F (37.2 C)   SpO2: 99% 100%    History of present illness:  Katelyn [name] bossiereis a 71 y.o.femalewith medical history significant ofhypertension, hyperlipidemia, diabetes mellitus, asthma, GERD, OSA not on CPAP, obesity, hyperparathyroidism, chronic pain syndrome, who presents with altered mental statusand left upper abdominal pain. Suspect gastroparesis with no sign of inflammation from scattered colonic diverticuli noted on CT abd pelvis w contrast. U/A (-), CT head (-) acute findings. CT angio chest no evidence of significant pulmonary embolism as reported by radiology.  Hospital course complicated by abdominal pain left upper quadrant which resolved after oral  reglan administration. Denies nausea and tolerating regular consistency diet. A1C 9.2. Patient advised to adhere to a diabetic diet and to avoid simple sugars and to have regular physical activity. Patient understands and agrees to plan.    Hospital Course:  Principal Problem:   Acute metabolic encephalopathy Active Problems:   HYPERTENSION, BENIGN ESSENTIAL   Asthma   Diabetes mellitus without complication (HCC)   GERD (gastroesophageal reflux disease)   Hyperparathyroidism ([place])   Abdominal pain   Dehydration   Hypercalcemia   Diabetic gastroparesis associated with type 2 diabetes mellitus ([place])  Acute metabolic encephalopathy: - resolved - Etiology is not clear. Likely due to multifactorial etiology, including hyercalcemia, dehydration. - Hypercalcemia has resolved - CT head is negative for acute intracranial abnormalities. - UA negative.Her UDS is negative.  Intractable left upper abdominal pain suspect gastroparesis -left upper Abdominal pain 9/10 sharp and non radiating resolved after reglan. -CT abd and pelvis w contrast scattered diverticuli with no sign of inflammation -Tolerated regular diet consistency -reglan 5 mg prior to meals TID x 2 days -control blood sugar - A1C 9.2 07/16/17 - No lactic acidosis  Hypercalcemiaand hx ofhyperparathyroidism: -resolved -calcium 10.1; was 10.7 on admission. 11.0 on5/12/18.  -Patient is on Prinzide, the side effects of HCTZ may have also contributed. -Discontinue Prinzide, switch to lisinopril for hypertension -keep hydrated  Hypokalemia -k+ 3.0 -replete as indicated -potassium pills to take 1 tab of 10 meq daily x 6 days until sees PCP  -Repeat BMP to check K+ at PCP appointment  Dehydration: -Hypovolemic -IV fluid hydration and encouraged po intake -Keep hydrated  Normocytic anemia -stable -no sign of obvious bleed -hg 10.6, mcv 82  HTN: -switched prinzide to lisinopril -Continue  amlodipine  Asthma:  -stable -  prn albuterol nebs  GERD: -Pepcid  Diabetes mellitus without complication ([place]): -I3K 9.2 07/16/17 -poorlycontroled. Patient is takingmetformin, byetta andAmarylat home.  -Blood sugar is 337 on presentation -labile BS with BS from 337 to 67. Hold off Lantus if BS less than 100. -SSI with hypoglycemia protocol  Soft tissue lipoma vs abscess in mid upper back:  -No fever or chills.  -Noleukocytosis. -No sign of acute or active infection -Observe now, follow up with PCP in 1 week  Ambulatory dysfunction -Physical therapy at home with home health -Fall precaution   Procedures:  None  Consultations:  None  Discharge Exam: BP (!) 152/99 (BP Location: Right Arm)   Pulse 93   Temp 98.9 F (37.2 C) (Oral)   Resp 18   Ht 5\' 5"  (1.651 m)   Wt 94.2 kg (207 lb 9.6 oz)   SpO2 100%   BMI 34.55 kg/m   General: 71 yo AAF WD WN NAD  Cardiovascular: RRR no murmurs rubs or gallops Respiratory: CTA no wheezes or rales  Discharge Instructions You were cared for by a hospitalist during your hospital stay. If you have any questions about your discharge medications or the care you received while you were in the hospital after you are discharged, you can call the unit and asked to speak with the hospitalist on call if the hospitalist that took care of you is not available. Once you are discharged, your primary care physician will handle any further medical issues. Please note that NO REFILLS for any discharge medications will be authorized once you are discharged, as it is imperative that you return to your primary care physician (or establish a relationship with a primary care physician if you do not have one) for your aftercare needs so that they can reassess your need for medications and monitor your lab values.   Allergies as of 07/17/2017      Reactions   Aspirin Other (See Comments)   Stomach irritation   Hydrochlorothiazide     REACTION: 2 trials of HCTZ with pt intolerance secondary to dizzines   Ibuprofen Other (See Comments)    Causes "shakiness"   Latex Itching   Irritates, Redness   Tramadol    Stomach upset      Medication List    STOP taking these medications   acetaminophen 500 MG tablet Commonly known as:  TYLENOL   Diclofenac-Misoprostol 75-0.2 MG Tbec   gabapentin 300 MG capsule Commonly known as:  NEURONTIN   HYDROcodone-acetaminophen 10-325 MG tablet Commonly known as:  NORCO   ibuprofen 200 MG tablet Commonly known as:  ADVIL,MOTRIN   lisinopril-hydrochlorothiazide 20-12.5 MG tablet Commonly known as:  PRINZIDE,ZESTORETIC   methocarbamol 500 MG tablet Commonly known as:  ROBAXIN   misoprostol 200 MCG tablet Commonly known as:  CYTOTEC   oxyCODONE-acetaminophen 5-325 MG tablet Commonly known as:  PERCOCET/ROXICET   tolterodine 2 MG tablet Commonly known as:  DETROL     TAKE these medications   albuterol 108 (90 Base) MCG/ACT inhaler Commonly known as:  PROVENTIL HFA;VENTOLIN HFA Inhale 2 puffs into the lungs daily as needed for wheezing.   amLODipine 5 MG tablet Commonly known as:  NORVASC Take 5 mg by mouth daily.   aspirin EC 81 MG tablet Take 81 mg by mouth daily.   Azelastine-Fluticasone 137-50 MCG/ACT Susp Commonly known as:  DYMISTA Place 2 sprays into both nostrils at bedtime.   diclofenac sodium 1 % Gel Commonly known as:  VOLTAREN Apply 1 application topically 2 (  two) times daily.   exenatide 5 MCG/0.02ML Sopn injection Commonly known as:  BYETTA Inject 5 mcg into the skin 2 (two) times daily with a meal.   famotidine 20 MG tablet Commonly known as:  PEPCID Take 20 mg by mouth 2 (two) times daily.   glimepiride 4 MG tablet Commonly known as:  AMARYL Take 4 mg by mouth daily.   insulin aspart protamine- aspart (70-30) 100 UNIT/ML injection Commonly known as:  NOVOLOG MIX 70/30 Inject 50 Units into the skin 2 (two) times daily.   metFORMIN  1000 MG tablet Commonly known as:  GLUCOPHAGE Take 1,000 mg by mouth 2 (two) times daily with a meal.   metoCLOPramide 5 MG tablet Commonly known as:  REGLAN Take 1 tablet (5 mg total) 3 (three) times daily before meals by mouth.   mometasone 50 MCG/ACT nasal spray Commonly known as:  NASONEX Place 1 spray into the nose 2 (two) times daily.   multivitamin tablet Take 1 tablet by mouth daily.   potassium chloride 10 MEQ tablet Commonly known as:  K-DUR Take 1 tablet (10 mEq total) daily by mouth.      Allergies  Allergen Reactions  . Aspirin Other (See Comments)    Stomach irritation  . Hydrochlorothiazide     REACTION: 2 trials of HCTZ with pt intolerance secondary to dizzines  . Ibuprofen Other (See Comments)     Causes "shakiness"  . Latex Itching    Irritates, Redness  . Tramadol     Stomach upset      The results of significant diagnostics from this hospitalization (including imaging, microbiology, ancillary and laboratory) are listed below for reference.    Significant Diagnostic Studies: Ct Head Wo Contrast  Result Date: 07/15/2017 CLINICAL DATA:  Mental status changes.  Disorientation. EXAM: CT HEAD WITHOUT CONTRAST TECHNIQUE: Contiguous axial images were obtained from the base of the skull through the vertex without intravenous contrast. COMPARISON:  None. FINDINGS: Brain: There is no evidence for acute hemorrhage, hydrocephalus, mass lesion, or abnormal extra-axial fluid collection. No definite CT evidence for acute infarction. Vascular: No hyperdense vessel or unexpected calcification. Skull: No evidence for fracture. No worrisome lytic or sclerotic lesion. Sinuses/Orbits: The visualized paranasal sinuses and mastoid air cells are clear. Visualized portions of the globes and intraorbital fat are unremarkable. Other: None. IMPRESSION: No acute intracranial abnormality. Electronically Signed   By: Misty Stanley M.D.   On: 07/15/2017 18:24   Ct Chest W  Contrast  Result Date: 07/15/2017 CLINICAL DATA:  71 year old female with history of lung cancer presenting with left flank pain. History of colonic polyp. EXAM: CT CHEST, ABDOMEN, AND PELVIS WITH CONTRAST TECHNIQUE: Multidetector CT imaging of the chest, abdomen and pelvis was performed following the standard protocol during bolus administration of intravenous contrast. CONTRAST:  148mL ISOVUE-300 IOPAMIDOL (ISOVUE-300) INJECTION 61% COMPARISON:  Chest radiograph dated 01/09/2017 and CT dated 05/17/2016 FINDINGS: CT CHEST FINDINGS Cardiovascular: There is no cardiomegaly or pericardial effusion. There is mild atherosclerotic calcification of the thoracic aorta. No aneurysmal dilatation or evidence of dissection. The origins of the great vessels of the aortic arch appear patent. The main pulmonary trunk and central pulmonary arteries appear unremarkable. Mediastinum/Nodes: There is no hilar or mediastinal adenopathy. The esophagus and the thyroid gland are grossly unremarkable. Lungs/Pleura: There is minimal bronchiectatic changes of the lower lobes. There is a 3 mm nodular density versus a vessel on end in the right upper lobe (series 6, image 62. A faint 7 mm ground-glass focus  in the right lower lobe medially (series 6 image 89 and series 7, image 95) most likely an area of scarring and residual from ground-glass density noted in this area on the prior CT. Areas of subpleural scarring noted more anteriorly as seen on the prior CT. There is no focal consolidation, pleural effusion, or pneumothorax. The central airways are patent. Musculoskeletal: There is focal area of skin thickening in the upper back at the level of T2-T3 vertebra. There is a 12 x 12 mm low attenuating collection with induration and thickening of the surrounding subcutaneous soft tissues in this area in the midline. Similar skin thickening was seen on the prior CT. However, the fluid collection is new since the prior study. If there has been  recent incision or biopsy findings may represent postprocedural seroma otherwise findings are concerning for an abscess. Correlation with clinical exam recommended. Ultrasound may provide better evaluation of this fluid collection. Left rotator cuff anchor pins noted. There is degenerative changes of the spine. No acute osseous pathology. CT ABDOMEN PELVIS FINDINGS There is no intra-abdominal free air or free fluid. Hepatobiliary: No focal liver abnormality is seen. No gallstones, gallbladder wall thickening, or biliary dilatation. Pancreas: Unremarkable. No pancreatic ductal dilatation or surrounding inflammatory changes. Spleen: Normal in size without focal abnormality. Adrenals/Urinary Tract: The adrenal glands are unremarkable. The kidneys, visualized ureters, and urinary bladder appear unremarkable as well. There is symmetric enhancement and excretion of contrast by both kidneys. Stomach/Bowel: There is moderate amount of stool throughout the colon. There is no bowel obstruction or active inflammation. Nondistended fecalized loops of distal small bowel noted which may represent increased transit time or small intestine bacterial overgrowth. Clinical correlation is recommended. There are small scattered colonic diverticula without active inflammatory changes. No evidence of bowel obstruction. Normal appendix. Vascular/Lymphatic: Mild aortoiliac atherosclerotic disease. The origins of the celiac axis, SMA, IMA appear patent. No portal venous gas. There is no adenopathy. Reproductive: The uterus is retroflexed. There multiple uterine fibroids with the largest measuring 2.5 cm exophytic from the posterior body of the uterus. The ovaries are grossly unremarkable as visualized. Other: There is a 2.3 cm supraumbilical peritoneal defect with fat containing hernia. No fluid collection or inflammatory changes. Musculoskeletal: There is degenerative changes of the spine. There is a transitional vertebra. There is grade 1  L5-S1 anterolisthesis. No acute osseous pathology. IMPRESSION: 1. Skin thickening and induration of the underlying subcutaneous soft tissues of the upper back in the midline at the level of T2/T3 with a small loculated fluid collection which may represent a postprocedural seroma or an abscess. Clinical correlation is recommended. Ultrasound may provide better evaluation of the fluid collection. 2. No acute intrathoracic, abdominal, or pelvic pathology. 3. A 7 mm ground-glass focus in the medial basal segment of the right lower lobe, likely residual scarring from prior CT. Overall there has been interval improvement of the ground-glass densities noted over this area on the prior CT. 4. No adenopathy or evidence of metastatic disease. 5. Scattered colonic diverticula without active inflammatory changes. No bowel obstruction. Normal appendix. 6.  Aortic Atherosclerosis (ICD10-I70.0). Electronically Signed   By: Anner Crete M.D.   On: 07/15/2017 20:21   Ct Abdomen Pelvis W Contrast  Result Date: 07/15/2017 CLINICAL DATA:  72 year old female with history of lung cancer presenting with left flank pain. History of colonic polyp. EXAM: CT CHEST, ABDOMEN, AND PELVIS WITH CONTRAST TECHNIQUE: Multidetector CT imaging of the chest, abdomen and pelvis was performed following the standard protocol during bolus  administration of intravenous contrast. CONTRAST:  118mL ISOVUE-300 IOPAMIDOL (ISOVUE-300) INJECTION 61% COMPARISON:  Chest radiograph dated 01/09/2017 and CT dated 05/17/2016 FINDINGS: CT CHEST FINDINGS Cardiovascular: There is no cardiomegaly or pericardial effusion. There is mild atherosclerotic calcification of the thoracic aorta. No aneurysmal dilatation or evidence of dissection. The origins of the great vessels of the aortic arch appear patent. The main pulmonary trunk and central pulmonary arteries appear unremarkable. Mediastinum/Nodes: There is no hilar or mediastinal adenopathy. The esophagus and the  thyroid gland are grossly unremarkable. Lungs/Pleura: There is minimal bronchiectatic changes of the lower lobes. There is a 3 mm nodular density versus a vessel on end in the right upper lobe (series 6, image 62. A faint 7 mm ground-glass focus in the right lower lobe medially (series 6 image 89 and series 7, image 95) most likely an area of scarring and residual from ground-glass density noted in this area on the prior CT. Areas of subpleural scarring noted more anteriorly as seen on the prior CT. There is no focal consolidation, pleural effusion, or pneumothorax. The central airways are patent. Musculoskeletal: There is focal area of skin thickening in the upper back at the level of T2-T3 vertebra. There is a 12 x 12 mm low attenuating collection with induration and thickening of the surrounding subcutaneous soft tissues in this area in the midline. Similar skin thickening was seen on the prior CT. However, the fluid collection is new since the prior study. If there has been recent incision or biopsy findings may represent postprocedural seroma otherwise findings are concerning for an abscess. Correlation with clinical exam recommended. Ultrasound may provide better evaluation of this fluid collection. Left rotator cuff anchor pins noted. There is degenerative changes of the spine. No acute osseous pathology. CT ABDOMEN PELVIS FINDINGS There is no intra-abdominal free air or free fluid. Hepatobiliary: No focal liver abnormality is seen. No gallstones, gallbladder wall thickening, or biliary dilatation. Pancreas: Unremarkable. No pancreatic ductal dilatation or surrounding inflammatory changes. Spleen: Normal in size without focal abnormality. Adrenals/Urinary Tract: The adrenal glands are unremarkable. The kidneys, visualized ureters, and urinary bladder appear unremarkable as well. There is symmetric enhancement and excretion of contrast by both kidneys. Stomach/Bowel: There is moderate amount of stool  throughout the colon. There is no bowel obstruction or active inflammation. Nondistended fecalized loops of distal small bowel noted which may represent increased transit time or small intestine bacterial overgrowth. Clinical correlation is recommended. There are small scattered colonic diverticula without active inflammatory changes. No evidence of bowel obstruction. Normal appendix. Vascular/Lymphatic: Mild aortoiliac atherosclerotic disease. The origins of the celiac axis, SMA, IMA appear patent. No portal venous gas. There is no adenopathy. Reproductive: The uterus is retroflexed. There multiple uterine fibroids with the largest measuring 2.5 cm exophytic from the posterior body of the uterus. The ovaries are grossly unremarkable as visualized. Other: There is a 2.3 cm supraumbilical peritoneal defect with fat containing hernia. No fluid collection or inflammatory changes. Musculoskeletal: There is degenerative changes of the spine. There is a transitional vertebra. There is grade 1 L5-S1 anterolisthesis. No acute osseous pathology. IMPRESSION: 1. Skin thickening and induration of the underlying subcutaneous soft tissues of the upper back in the midline at the level of T2/T3 with a small loculated fluid collection which may represent a postprocedural seroma or an abscess. Clinical correlation is recommended. Ultrasound may provide better evaluation of the fluid collection. 2. No acute intrathoracic, abdominal, or pelvic pathology. 3. A 7 mm ground-glass focus in the medial basal  segment of the right lower lobe, likely residual scarring from prior CT. Overall there has been interval improvement of the ground-glass densities noted over this area on the prior CT. 4. No adenopathy or evidence of metastatic disease. 5. Scattered colonic diverticula without active inflammatory changes. No bowel obstruction. Normal appendix. 6.  Aortic Atherosclerosis (ICD10-I70.0). Electronically Signed   By: Anner Crete M.D.    On: 07/15/2017 20:21    Microbiology: No results found for this or any previous visit (from the past 240 hour(s)).   Labs: Basic Metabolic Panel: Recent Labs  Lab 07/15/17 1611 07/16/17 0212 07/16/17 0801 07/17/17 0609  NA 135 134*  --  137  K 3.8 3.0*  --  3.7  CL 105 105  --  108  CO2 23 18*  --  22  GLUCOSE 337* 21* 80 145*  BUN 15 15  --  8  CREATININE 0.63 0.60  --  0.56  CALCIUM 10.7* 10.3  --  10.1   Liver Function Tests: Recent Labs  Lab 07/15/17 1611 07/17/17 0609  AST 17 17  ALT 13* 12*  ALKPHOS 102 78  BILITOT 0.6 0.5  PROT 8.0 6.7  ALBUMIN 3.7 2.9*   Recent Labs  Lab 07/15/17 1611  LIPASE 32   Recent Labs  Lab 07/15/17 1907  AMMONIA 35   CBC: Recent Labs  Lab 07/15/17 1611 07/16/17 0212 07/17/17 0609  WBC 6.4 8.1 5.3  HGB 12.8 11.8* 10.6*  HCT 38.8 36.0 33.7*  MCV 83.1 82.2 82.8  PLT 245 237 209   Cardiac Enzymes: No results for input(s): CKTOTAL, CKMB, CKMBINDEX, TROPONINI in the last 168 hours. BNP: BNP (last 3 results) No results for input(s): BNP in the last 8760 hours.  ProBNP (last 3 results) No results for input(s): PROBNP in the last 8760 hours.  CBG: Recent Labs  Lab 07/16/17 1146 07/16/17 1651 07/16/17 2044 07/17/17 0722 07/17/17 1108  GLUCAP 86 169* 164* 134* 193*       Signed:  Kayleen Memos, MD [facility] 07/17/2017, 11:42 AM

## 2017-07-17 NOTE — Evaluation (Signed)
Occupational Therapy Evaluation Patient Details Name: Katelyn [name] MRN: [id number] DOB: 26-May-1946 Today's Date: 07/17/2017    History of Present Illness Pt is a 71 y.o. female with   Acute metabolic encephalopathy.  Pt with with medical history significant of hypertension, hyperlipidemia, diabetes mellitus, asthma, GERD, OSA not on CPAP, obesity, hyperparathyroidism, chronic pain syndrome, who presents with altered mental status and left upper abdominal pain.   Clinical Impression   Pt presenting with pain in left side. Pt is independent at baseline. At evaluation, requiring min guard-min assist for ADLs and functional mobility.  Pt reports she lives alone but does have 24/7 supervision/assist available from family and friends.  Recommending dc home with 24/7 supervision/assist.  Will continue to follow acutely to address deficits listed below and to maximize safety and independence with ADLs in prep for return home.     Follow Up Recommendations  No OT follow up;Supervision/Assistance - 24 hour    Equipment Recommendations  None recommended by OT    Recommendations for Other Services       Precautions / Restrictions Precautions Precautions: Fall Restrictions Weight Bearing Restrictions: No      Mobility Bed Mobility Overal bed mobility: Modified Independent                Transfers Overall transfer level: Needs assistance   Transfers: Sit to/from Stand Sit to Stand: Min assist         General transfer comment: Min assist for power up.  VCs for hand placement.    Balance                                           ADL either performed or assessed with clinical judgement   ADL Overall ADL's : Needs assistance/impaired Eating/Feeding: Independent   Grooming: Supervision/safety;Standing   Upper Body Bathing: Supervision/ safety;Sitting   Lower Body Bathing: Minimal assistance;Sit to/from stand   Upper Body Dressing :  Supervision/safety;Sitting   Lower Body Dressing: Minimal assistance;Sit to/from stand   Toilet Transfer: Minimal assistance;RW;Regular Toilet;Grab bars   Toileting- Water quality scientist and Hygiene: Min guard;Sit to/from stand       Functional mobility during ADLs: Min guard;Rolling walker General ADL Comments: Pt completed toileting and grooming tasks with min guard- min assist.       Vision         Perception     Praxis      Pertinent Vitals/Pain Pain Assessment: 0-10 Pain Score: 9  Pain Location: left side Pain Descriptors / Indicators: Sharp Pain Intervention(s): Monitored during session;Limited activity within patient's tolerance     Hand Dominance     Extremity/Trunk Assessment Upper Extremity Assessment Upper Extremity Assessment: Overall WFL for tasks assessed   Lower Extremity Assessment Lower Extremity Assessment: Defer to PT evaluation       Communication Communication Communication: No difficulties   Cognition Arousal/Alertness: Awake/alert Behavior During Therapy: WFL for tasks assessed/performed Overall Cognitive Status: Within Functional Limits for tasks assessed                                     General Comments       Exercises     Shoulder Instructions      Home Living Family/patient expects to be discharged to:: Private residence Living Arrangements: Alone Available Help at  Discharge: Family;Available 24 hours/day;Friend(s) Type of Home: House             Bathroom Shower/Tub: Walk-in Psychologist, prison and probation services: Standard     Home Equipment: Environmental consultant - 2 wheels;Cane - single point;Shower seat;Bedside commode   Additional Comments: Pt lives alone but reports her daughter and sisters as well as friends can provide as much assist as needed.       Prior Functioning/Environment Level of Independence: Independent with assistive device(s)        Comments: Uses cane vs. RW.        OT Problem List: Decreased  activity tolerance;Impaired balance (sitting and/or standing);Pain      OT Treatment/Interventions: Self-care/ADL training;Therapeutic exercise;Patient/family education;Balance training    OT Goals(Current goals can be found in the care plan section) Acute Rehab OT Goals Patient Stated Goal: to go home OT Goal Formulation: With patient Time For Goal Achievement: 07/31/17 Potential to Achieve Goals: Good ADL Goals Pt Will Perform Grooming: with modified independence;standing Pt Will Perform Upper Body Bathing: with modified independence;sitting Pt Will Perform Lower Body Bathing: with modified independence;sit to/from stand Pt Will Perform Upper Body Dressing: with modified independence;sitting Pt Will Perform Lower Body Dressing: with modified independence;sit to/from stand Pt Will Transfer to Toilet: with modified independence;ambulating;regular height toilet  OT Frequency: Min 3X/week   Barriers to D/C:            Co-evaluation              AM-PAC PT "6 Clicks" Daily Activity     Outcome Measure Help from another person eating meals?: None Help from another person taking care of personal grooming?: None Help from another person toileting, which includes using toliet, bedpan, or urinal?: A Little Help from another person bathing (including washing, rinsing, drying)?: None Help from another person to put on and taking off regular upper body clothing?: A Little Help from another person to put on and taking off regular lower body clothing?: A Little 6 Click Score: 21   End of Session Equipment Utilized During Treatment: Gait belt;Rolling walker Nurse Communication: Mobility status(communicated to NT that pure wick needs to be replaced)  Activity Tolerance: Patient tolerated treatment well Patient left: in bed;with call bell/phone within reach  OT Visit Diagnosis: Unsteadiness on feet (R26.81);Muscle weakness (generalized) (M62.81);Pain Pain - Right/Left: Left Pain -  part of body: (abdomen)                Time: 0626-9485 OT Time Calculation (min): 26 min Charges:  OT General Charges $OT Visit: 1 Visit OT Evaluation $OT Eval Low Complexity: 1 Low OT Treatments $Self Care/Home Management : 8-22 mins G-Codes:       Darrol Jump OTR/L 07/17/2017, 11:50 AM

## 2017-07-17 NOTE — Evaluation (Signed)
Physical Therapy Evaluation Patient Details Name: Katelyn [name] MRN: [id number] DOB: Jun 14, 1946 Today's Date: 07/17/2017   History of Present Illness  Pt is a 71 y.o. female with   Acute metabolic encephalopathy.  Pt with with medical history significant of hypertension, hyperlipidemia, diabetes mellitus, asthma, GERD, OSA not on CPAP, obesity, hyperparathyroidism, chronic pain syndrome, who presents with altered mental status and left upper abdominal pain.  Clinical Impression  Patient presents with problems listed below.  Patient to discharge home today with 24* assist available.  Recommend f/u HHPT (arranged by CM) for continued therapy for mobility, gait, balance.    Follow Up Recommendations Home health PT;Supervision for mobility/OOB    Equipment Recommendations  None recommended by PT    Recommendations for Other Services       Precautions / Restrictions Precautions Precautions: Fall Restrictions Weight Bearing Restrictions: No      Mobility  Bed Mobility Overal bed mobility: Modified Independent             General bed mobility comments: Required increased time to bring LE's onto bed to return to supine.  Transfers Overall transfer level: Needs assistance Equipment used: Straight cane Transfers: Sit to/from Stand Sit to Stand: Min guard         General transfer comment: No physical assist needed.  Min guard for safety/balance.  Ambulation/Gait Ambulation/Gait assistance: Min guard Ambulation Distance (Feet): 64 Feet Assistive device: Straight cane Gait Pattern/deviations: Step-through pattern;Decreased step length - right;Decreased step length - left;Decreased stride length;Shuffle Gait velocity: decreased Gait velocity interpretation: Below normal speed for age/gender General Gait Details: Patient demonstrates safe use of cane.  However does reach for furniture/objects with other hand for balance.  Reaches for objects that are not stable (carts)  at times.  Patient reports she ususally walks "better" at home.  Stairs            Wheelchair Mobility    Modified Rankin (Stroke Patients Only)       Balance Overall balance assessment: Needs assistance;History of Falls Sitting-balance support: No upper extremity supported;Feet supported Sitting balance-Leahy Scale: Good     Standing balance support: Single extremity supported Standing balance-Leahy Scale: Poor                               Pertinent Vitals/Pain Pain Assessment: 0-10 Pain Score: 8  Pain Location: left side Pain Descriptors / Indicators: Sharp Pain Intervention(s): Monitored during session;Patient requesting pain meds-RN notified;RN gave pain meds during session    Home Living Family/patient expects to be discharged to:: Private residence Living Arrangements: Alone Available Help at Discharge: Family;Friend(s);Available 24 hours/day Type of Home: House         Home Equipment: Walker - 2 wheels;Cane - single point;Shower seat;Bedside commode Additional Comments: Pt lives alone but reports her daughter and sisters as well as friends can provide as much assist as needed.     Prior Function Level of Independence: Independent with assistive device(s)         Comments: Uses cane vs. RW.     Hand Dominance        Extremity/Trunk Assessment   Upper Extremity Assessment Upper Extremity Assessment: Defer to OT evaluation    Lower Extremity Assessment Lower Extremity Assessment: Generalized weakness       Communication   Communication: No difficulties  Cognition Arousal/Alertness: Awake/alert Behavior During Therapy: WFL for tasks assessed/performed Overall Cognitive Status: No family/caregiver present to determine baseline  cognitive functioning                                 General Comments: Difficulty with short-term memory, remembering OT session.  Decreased safety awareness,  reaching for objects that  are not stable during gait with cane.      General Comments General comments (skin integrity, edema, etc.): Reports itching on posterior Rt shoulder and down posterior arm.    Exercises     Assessment/Plan    PT Assessment All further PT needs can be met in the next venue of care  PT Problem List Decreased strength;Decreased balance;Decreased mobility;Decreased safety awareness;Pain       PT Treatment Interventions      PT Goals (Current goals can be found in the Care Plan section)  Acute Rehab PT Goals Patient Stated Goal: to go home PT Goal Formulation: All assessment and education complete, DC therapy(Patient to have f/u HHPT)    Frequency     Barriers to discharge        Co-evaluation               AM-PAC PT "6 Clicks" Daily Activity  Outcome Measure Difficulty turning over in bed (including adjusting bedclothes, sheets and blankets)?: None Difficulty moving from lying on back to sitting on the side of the bed? : None Difficulty sitting down on and standing up from a chair with arms (e.g., wheelchair, bedside commode, etc,.)?: A Little Help needed moving to and from a bed to chair (including a wheelchair)?: A Little Help needed walking in hospital room?: A Little Help needed climbing 3-5 steps with a railing? : A Lot 6 Click Score: 19    End of Session Equipment Utilized During Treatment: Gait belt Activity Tolerance: Patient tolerated treatment well Patient left: in bed;with call bell/phone within reach;with bed alarm set Nurse Communication: Mobility status;Patient requests pain meds(Recommend HHPT) PT Visit Diagnosis: Unsteadiness on feet (R26.81);Other abnormalities of gait and mobility (R26.89);History of falling (Z91.81);Muscle weakness (generalized) (M62.81)    Time: 8453-6468 PT Time Calculation (min) (ACUTE ONLY): 24 min   Charges:   PT Evaluation $PT Eval Moderate Complexity: 1 Mod PT Treatments $Gait Training: 8-22 mins   PT G Codes:         Carita Pian. Sanjuana Kava, [facility] Acute Rehab Services Pager [place] 07/17/2017, 1:39 PM

## 2017-07-17 NOTE — Care Management Note (Signed)
Case Management Note  Patient Details  Name: Katelyn [name] MRN: [id number] Date of Birth: March 16, 1946  Subjective/Objective:                 Spoke with patient athe bedside. Patient states that she lives at home by herself. She states that her sister, daughter, and cousins all live very close by less than 5 miles away. She states that they all check in on her. She states that she ambulates around the house with assist of a cane and RW. She has RW and cane in her home and another set in her car. She states that she drives herself to her MD appointments and denies any barriers to getting her mediactions. She states she has running water, electricity, heat, and a phone. Referral placed to [facility] for [facility] PT as ordered. No other CM needs identified. CM contacted Denyse Amass CSW to notify her that patient will DC today. CM signing off.    Action/Plan:   Expected Discharge Date:  07/17/17               Expected Discharge Plan:  [place]  In-House Referral:     Discharge planning Services  CM Consult  Post Acute Care Choice:  Home Health Choice offered to:  Patient  DME Arranged:    DME Agency:     HH Arranged:  PT [place]:  [place]  Status of Service:  Completed, signed off  If discussed at [place] of Stay Meetings, dates discussed:    Additional Comments:  Carles Collet, RN 07/17/2017, 12:17 PM

## 2017-07-17 NOTE — Clinical Social Work Note (Signed)
Clinical Social Worker received notification from evening CSW to notify APS at the time of patient discharge.  CSW left a message for Junius Finner [phone]) at 12:15pm today with updates regarding patient discharge.  Per evening CSW, APS just requests notification of patient discharge and no concerns regarding actual discharge home.  CSW remains available for support as needed.  Barbette Or, [place]

## 2017-07-17 NOTE — Progress Notes (Signed)
Discharge instructions covered with pt and pt sister, Vaughan Basta. They have been educated on discharge instructions and have no further questions. Vaughan Basta is taking pt home. IV and tele removed.

## 2017-07-20 LAB — CULTURE, BLOOD (ROUTINE X 2)
Culture: NO GROWTH
Culture: NO GROWTH
Special Requests: ADEQUATE
Special Requests: ADEQUATE

## 2017-07-27 DIAGNOSIS — I119 Hypertensive heart disease without heart failure: Secondary | ICD-10-CM | POA: Diagnosis not present

## 2017-07-27 DIAGNOSIS — J302 Other seasonal allergic rhinitis: Secondary | ICD-10-CM | POA: Diagnosis not present

## 2017-07-27 DIAGNOSIS — M170 Bilateral primary osteoarthritis of knee: Secondary | ICD-10-CM | POA: Diagnosis not present

## 2017-07-27 DIAGNOSIS — J45909 Unspecified asthma, uncomplicated: Secondary | ICD-10-CM | POA: Diagnosis not present

## 2017-07-27 DIAGNOSIS — E785 Hyperlipidemia, unspecified: Secondary | ICD-10-CM | POA: Diagnosis not present

## 2017-07-27 DIAGNOSIS — E119 Type 2 diabetes mellitus without complications: Secondary | ICD-10-CM | POA: Diagnosis not present

## 2017-07-27 DIAGNOSIS — E559 Vitamin D deficiency, unspecified: Secondary | ICD-10-CM | POA: Diagnosis not present

## 2017-07-27 DIAGNOSIS — I10 Essential (primary) hypertension: Secondary | ICD-10-CM | POA: Diagnosis not present

## 2017-08-04 DIAGNOSIS — E785 Hyperlipidemia, unspecified: Secondary | ICD-10-CM | POA: Diagnosis not present

## 2017-08-04 DIAGNOSIS — I10 Essential (primary) hypertension: Secondary | ICD-10-CM | POA: Diagnosis not present

## 2017-08-04 DIAGNOSIS — E559 Vitamin D deficiency, unspecified: Secondary | ICD-10-CM | POA: Diagnosis not present

## 2017-08-04 DIAGNOSIS — J302 Other seasonal allergic rhinitis: Secondary | ICD-10-CM | POA: Diagnosis not present

## 2017-08-04 DIAGNOSIS — I119 Hypertensive heart disease without heart failure: Secondary | ICD-10-CM | POA: Diagnosis not present

## 2017-08-04 DIAGNOSIS — E119 Type 2 diabetes mellitus without complications: Secondary | ICD-10-CM | POA: Diagnosis not present

## 2017-08-04 DIAGNOSIS — M170 Bilateral primary osteoarthritis of knee: Secondary | ICD-10-CM | POA: Diagnosis not present

## 2017-08-04 DIAGNOSIS — J45909 Unspecified asthma, uncomplicated: Secondary | ICD-10-CM | POA: Diagnosis not present

## 2017-08-12 DIAGNOSIS — I119 Hypertensive heart disease without heart failure: Secondary | ICD-10-CM | POA: Diagnosis not present

## 2017-08-12 DIAGNOSIS — E119 Type 2 diabetes mellitus without complications: Secondary | ICD-10-CM | POA: Diagnosis not present

## 2017-08-12 DIAGNOSIS — M170 Bilateral primary osteoarthritis of knee: Secondary | ICD-10-CM | POA: Diagnosis not present

## 2017-08-12 DIAGNOSIS — J45909 Unspecified asthma, uncomplicated: Secondary | ICD-10-CM | POA: Diagnosis not present

## 2017-08-12 DIAGNOSIS — E785 Hyperlipidemia, unspecified: Secondary | ICD-10-CM | POA: Diagnosis not present

## 2017-08-12 DIAGNOSIS — E559 Vitamin D deficiency, unspecified: Secondary | ICD-10-CM | POA: Diagnosis not present

## 2017-08-12 DIAGNOSIS — J302 Other seasonal allergic rhinitis: Secondary | ICD-10-CM | POA: Diagnosis not present

## 2017-08-12 DIAGNOSIS — I10 Essential (primary) hypertension: Secondary | ICD-10-CM | POA: Diagnosis not present

## 2017-08-26 ENCOUNTER — Encounter (HOSPITAL_COMMUNITY): Payer: Self-pay | Admitting: Emergency Medicine

## 2017-08-26 ENCOUNTER — Other Ambulatory Visit: Payer: Self-pay

## 2017-08-26 ENCOUNTER — Ambulatory Visit (HOSPITAL_COMMUNITY)
Admission: EM | Admit: 2017-08-26 | Discharge: 2017-08-26 | Disposition: A | Discharge status: Home / Self Care | Payer: PPO | Attending: Internal Medicine | Admitting: Internal Medicine

## 2017-08-26 DIAGNOSIS — E162 Hypoglycemia, unspecified: Secondary | ICD-10-CM | POA: Diagnosis not present

## 2017-08-26 LAB — GLUCOSE, CAPILLARY
Glucose-Capillary: 44 mg/dL — CL (ref 65–99)
Glucose-Capillary: 85 mg/dL (ref 65–99)

## 2017-08-26 NOTE — ED Notes (Signed)
PT GIVEN CRACKERS, PEANUT BUTTER AND COKE. PT IS ALERT AND ORIENTED AND TALKING.

## 2017-08-26 NOTE — ED Provider Notes (Signed)
[place]    CSN: [id number] Arrival date & time: 08/26/17  1729     History   Chief Complaint Chief Complaint  Patient presents with  . Hypoglycemia    HPI Katelyn [name] is a 71 y.o. female history of DM presenting feeling off and confused. Also with associated sweating and shaking. This began around 4 oclock today. Blood sugar on arrival was 44, after snacking improved to 85. Daughter states she is much improved. Denies urinary symptoms.   HPI  Past Medical History:  Diagnosis Date  . Allergic rhinitis   . Asthma   . Chronic pain syndrome   . Colon polyp   . Diabetes mellitus   . H. pylori infection   . Hyperlipidemia   . Hyperparathyroidism ([place])   . Hypertension   . Hypertensive heart disease without heart failure   . Morbid obesity ([place])   . Sleep apnea   . Vitamin D deficiency     Patient Active Problem List   Diagnosis Date Noted  . Dehydration 07/16/2017  . Hypercalcemia 07/16/2017  . Diabetic gastroparesis associated with type 2 diabetes mellitus ([place]) 07/16/2017  . Asthma 07/15/2017  . Diabetes mellitus without complication ([place]) 31/49/7026  . GERD (gastroesophageal reflux disease) 07/15/2017  . Hyperparathyroidism ([place]) 07/15/2017  . Acute metabolic encephalopathy 37/85/8850  . Abdominal pain 07/15/2017  . Diabetic neuropathy ([place]) 05/07/2015  . Achilles tendinitis of left lower extremity 03/30/2014  . Spinal stenosis, lumbar region, with neurogenic claudication 02/16/2014  . Other chronic postoperative pain 02/16/2014  . Bilateral knee contractures 02/16/2014  . Obstructive sleep apnea 06/26/2013  . TRIGGER FINGER 04/29/2010  . Allergic rhinitis, cause unspecified 03/24/2010  . OSTEOARTHRITIS 02/10/2010  . DENTAL CARIES 09/24/2009  . DIABETIC  RETINOPATHY 04/29/2009  . ONYCHOMYCOSIS, TOENAILS 04/08/2009  . DYSLIPIDEMIA 04/08/2009  . EDEMA 04/08/2009  . HYPERTENSION 12/24/2008  . ELECTROCARDIOGRAM, ABNORMAL 12/17/2008  .  SCIATICA, RIGHT 07/11/2007  . Type II or unspecified type diabetes mellitus without mention of complication, not stated as uncontrolled 04/11/2007  . ANEMIA DUE TO DIETARY IRON DEFICIENCY 04/11/2007  . HYPERTENSION, BENIGN ESSENTIAL 04/11/2007  . OSTEOARTHRITIS, GENERALIZED 04/11/2007    Past Surgical History:  Procedure Laterality Date  . JOINT REPLACEMENT      OB History    No data available       Home Medications    Prior to Admission medications   Medication Sig Start Date End Date Taking? Authorizing Provider  albuterol (PROVENTIL HFA;VENTOLIN HFA) 108 (90 BASE) MCG/ACT inhaler Inhale 2 puffs into the lungs daily as needed for wheezing.     [provider]  amLODipine (NORVASC) 5 MG tablet Take 5 mg by mouth daily.    [provider]  aspirin EC 81 MG tablet Take 81 mg by mouth daily.     [provider]  Azelastine-Fluticasone (DYMISTA) 137-50 MCG/ACT SUSP Place 2 sprays into both nostrils at bedtime. Patient not taking: Reported on 07/15/2017 06/26/13   Baird Lyons D, MD  diclofenac sodium (VOLTAREN) 1 % GEL Apply 1 application topically 2 (two) times daily. 10/30/16   [provider]  exenatide (BYETTA) 5 MCG/0.02ML SOPN injection Inject 5 mcg into the skin 2 (two) times daily with a meal.    [provider]  famotidine (PEPCID) 20 MG tablet Take 20 mg by mouth 2 (two) times daily.    [provider]  glimepiride (AMARYL) 4 MG tablet Take 4 mg by mouth daily. 10/29/16   [provider]  insulin aspart protamine-insulin aspart (NOVOLOG 70/30) (70-30) 100 UNIT/ML injection Inject 50 Units into the skin 2 (two) times daily.     [provider]  metFORMIN (GLUCOPHAGE) 1000 MG tablet Take 1,000 mg by mouth 2 (two) times daily with a meal.    [provider]  metoCLOPramide (REGLAN) 5 MG tablet Take 1 tablet (5 mg total) 3 (three) times daily before meals by mouth. 07/17/17   Hall, Carole N, DO    mometasone (NASONEX) 50 MCG/ACT nasal spray Place 1 spray into the nose 2 (two) times daily.    [provider]  Multiple Vitamin (MULTIVITAMIN) tablet Take 1 tablet by mouth daily.    [provider]  potassium chloride (K-DUR) 10 MEQ tablet Take 1 tablet (10 mEq total) daily by mouth. 07/17/17   Kayleen Memos, DO    Family History Family History  Problem Relation Age of Onset  . Prostate cancer Father     Social History Social History   Tobacco Use  . Smoking status: Never Smoker  . Smokeless tobacco: Never Used  Substance Use Topics  . Alcohol use: No  . Drug use: No     Allergies   Aspirin; Hydrochlorothiazide; Ibuprofen; Latex; and Tramadol   Review of Systems Review of Systems  Constitutional: Negative for fever.  Gastrointestinal: Negative for abdominal pain, nausea and vomiting.  Genitourinary: Negative for dysuria and frequency.  Skin: Negative for rash.  Neurological: Negative for dizziness, speech difficulty, weakness, light-headedness and headaches.     Physical Exam Triage Vital Signs ED Triage Vitals [08/26/17 1801]  Enc Vitals Group     BP 131/71     Pulse Rate (!) 106     Resp 18     Temp (!) 97.4 F (36.3 C)     Temp src      SpO2 100 %     Weight      Height      Head Circumference      Peak Flow      Pain Score      Pain Loc      Pain Edu?      Excl. in [place]?    No data found.  Updated Vital Signs BP 131/71 (BP Location: Left Arm)   Pulse (!) 106   Temp (!) 97.4 F (36.3 C)   Resp 18   SpO2 100%   Visual Acuity Right Eye Distance:   Left Eye Distance:   Bilateral Distance:    Right Eye Near:   Left Eye Near:    Bilateral Near:     Physical Exam  Constitutional: She appears well-developed and well-nourished. No distress.  HENT:  Head: Normocephalic and atraumatic.  Eyes: Conjunctivae are normal.  Neck: Neck supple.  Cardiovascular: Normal rate and regular rhythm.  No murmur heard. Pulmonary/Chest:  Effort normal and breath sounds normal. No respiratory distress.  Abdominal: Soft. There is no tenderness.  Musculoskeletal: She exhibits no edema.  Neurological: She is alert. No cranial nerve deficit.  Mildly disoriented- has to be reminded where she is; appears to have dementia at baseline according to daughter  Skin: Skin is warm and dry.  Psychiatric: She has a normal mood and affect.  Nursing note and vitals reviewed.    UC Treatments / Results  Labs (all labs ordered are listed, but only abnormal results are displayed) Labs Reviewed  GLUCOSE, CAPILLARY - Abnormal; Notable for the following components:      Result Value  Glucose-Capillary 44 (*)    All other components within normal limits  GLUCOSE, CAPILLARY    EKG  EKG Interpretation None       Radiology No results found.  Procedures Procedures (including critical care time)  Medications Ordered in UC Medications - No data to display   Initial Impression / Assessment and Plan / UC Course  I have reviewed the triage vital signs and the nursing notes.  Pertinent labs & imaging results that were available during my care of the patient were reviewed by me and considered in my medical decision making (see chart for details).    Symptoms improved with juice/crackers. Discussed changing metformin from 1000 BID to 500 BID as daughter states this is what she used to be on and switched to 1000 1 week ago. Continue glimepride 4 mg BID. Not currently using byetta and novolog as patient does not have needles. Advised to follow up with PCP for more definitive management, to check her sugars at home and record them to take to PCP. Try juice/snacks if symptoms return, if not resolving with snacking go to emergency room.   Final Clinical Impressions(s) / UC Diagnoses   Final diagnoses:  Hypoglycemia    ED Discharge Orders    None       Controlled Substance Prescriptions [place] Controlled Substance Registry consulted?  Not Applicable   Janith Lima, [place] 08/26/17 2014

## 2017-08-26 NOTE — Discharge Instructions (Signed)
Blood sugar on arrival was 44, went up to 85 after juice/snacks  Please cut Metformin back to 500 mg (1/2 tablet) twice daily. Continue glimepiride as prescribed.   Monitor sugars at home in the AM before eating and late afternoon, record these to take to primary provider. Check sugar whenever she begins to feel "off".   Give juice, crackers, peanut butter to increase sugar when it is low.   Follow up with Primary to further discuss management of diabetes. Return to emergency room if symptoms not resolving with snacking.

## 2017-08-26 NOTE — ED Notes (Signed)
Notified david mabe, np of patient's cbg

## 2017-08-26 NOTE — ED Triage Notes (Addendum)
Family member reports patient was found this afternoon saying she was not feeling well and was complaining of feeling jittery and weak.  Feels "awkward"

## 2017-08-31 ENCOUNTER — Emergency Department (HOSPITAL_COMMUNITY): Payer: PPO

## 2017-08-31 ENCOUNTER — Encounter (HOSPITAL_COMMUNITY): Payer: Self-pay | Admitting: Emergency Medicine

## 2017-08-31 ENCOUNTER — Emergency Department (HOSPITAL_COMMUNITY)
Admission: EM | Admit: 2017-08-31 | Discharge: 2017-08-31 | Disposition: A | Discharge status: Home / Self Care | Payer: PPO | Attending: Emergency Medicine | Admitting: Emergency Medicine

## 2017-08-31 DIAGNOSIS — Y999 Unspecified external cause status: Secondary | ICD-10-CM | POA: Diagnosis not present

## 2017-08-31 DIAGNOSIS — Y929 Unspecified place or not applicable: Secondary | ICD-10-CM | POA: Insufficient documentation

## 2017-08-31 DIAGNOSIS — M25561 Pain in right knee: Secondary | ICD-10-CM | POA: Diagnosis not present

## 2017-08-31 DIAGNOSIS — Y939 Activity, unspecified: Secondary | ICD-10-CM | POA: Insufficient documentation

## 2017-08-31 DIAGNOSIS — R0789 Other chest pain: Secondary | ICD-10-CM | POA: Diagnosis not present

## 2017-08-31 DIAGNOSIS — S299XXA Unspecified injury of thorax, initial encounter: Secondary | ICD-10-CM | POA: Diagnosis not present

## 2017-08-31 DIAGNOSIS — R0781 Pleurodynia: Secondary | ICD-10-CM | POA: Diagnosis not present

## 2017-08-31 DIAGNOSIS — R220 Localized swelling, mass and lump, head: Secondary | ICD-10-CM | POA: Diagnosis not present

## 2017-08-31 DIAGNOSIS — T148XXA Other injury of unspecified body region, initial encounter: Secondary | ICD-10-CM | POA: Diagnosis not present

## 2017-08-31 DIAGNOSIS — M25569 Pain in unspecified knee: Secondary | ICD-10-CM | POA: Diagnosis not present

## 2017-08-31 DIAGNOSIS — S8991XA Unspecified injury of right lower leg, initial encounter: Secondary | ICD-10-CM | POA: Diagnosis not present

## 2017-08-31 MED ORDER — ACETAMINOPHEN 325 MG PO TABS
650.0000 mg | ORAL_TABLET | Freq: Once | ORAL | Status: AC
  Administered 2017-08-31: 650 mg via ORAL
  Filled 2017-08-31: qty 2
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
## 2017-08-31 NOTE — ED Notes (Signed)
ED Provider at bedside. 

## 2017-08-31 NOTE — ED Notes (Signed)
Pt family member repeatedly interrupted EDP and pt conversation. Family member frequently answering questions for pt.
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
## 2017-08-31 NOTE — Discharge Instructions (Signed)
The x-ray of your knee and ribs was reassuring.  No break in the bones.  Please use Tylenol at home for pain.  Please ice the knee and head.  Elevate the knee when you can.  Return to the emergency department if you develop a headache which is worsening, if you have changes in your vision or vomiting that will not stop or have any new or worsening symptoms.

## 2017-09-01 NOTE — Patient Outreach (Signed)
Outreach patient after ED visit.  Verified PCP is current and patient does not have transportation issues.  [facility] services and 24 Hour Nurse Advice Line was explained and Daughter made note of the number.  I asked if she would like to go over questionnaire and have one of my team members do a follow up call within 7 to 10 days.  She said she did not have time at the moment but would be looking for information to be mailed and would call back at a later date.  Successful letter, know before you go and magnet mailed on today.

## 2017-09-02 ENCOUNTER — Ambulatory Visit: Payer: Self-pay | Admitting: Family Medicine

## 2017-09-13 DIAGNOSIS — F0390 Unspecified dementia without behavioral disturbance: Secondary | ICD-10-CM | POA: Diagnosis not present

## 2017-09-13 DIAGNOSIS — G5713 Meralgia paresthetica, bilateral lower limbs: Secondary | ICD-10-CM | POA: Diagnosis not present

## 2017-09-13 DIAGNOSIS — E1142 Type 2 diabetes mellitus with diabetic polyneuropathy: Secondary | ICD-10-CM | POA: Diagnosis not present

## 2017-09-23 DIAGNOSIS — R413 Other amnesia: Secondary | ICD-10-CM | POA: Diagnosis not present

## 2017-10-11 DIAGNOSIS — F0391 Unspecified dementia with behavioral disturbance: Secondary | ICD-10-CM | POA: Diagnosis not present

## 2017-11-11 DIAGNOSIS — Z01118 Encounter for examination of ears and hearing with other abnormal findings: Secondary | ICD-10-CM | POA: Diagnosis not present

## 2017-11-11 DIAGNOSIS — J45909 Unspecified asthma, uncomplicated: Secondary | ICD-10-CM | POA: Diagnosis not present

## 2017-11-11 DIAGNOSIS — E559 Vitamin D deficiency, unspecified: Secondary | ICD-10-CM | POA: Diagnosis not present

## 2017-11-11 DIAGNOSIS — J302 Other seasonal allergic rhinitis: Secondary | ICD-10-CM | POA: Diagnosis not present

## 2017-11-11 DIAGNOSIS — E785 Hyperlipidemia, unspecified: Secondary | ICD-10-CM | POA: Diagnosis not present

## 2017-11-11 DIAGNOSIS — E119 Type 2 diabetes mellitus without complications: Secondary | ICD-10-CM | POA: Diagnosis not present

## 2017-11-11 DIAGNOSIS — I10 Essential (primary) hypertension: Secondary | ICD-10-CM | POA: Diagnosis not present

## 2017-11-11 DIAGNOSIS — Z0000 Encounter for general adult medical examination without abnormal findings: Secondary | ICD-10-CM | POA: Diagnosis not present

## 2017-11-11 DIAGNOSIS — Z5181 Encounter for therapeutic drug level monitoring: Secondary | ICD-10-CM | POA: Diagnosis not present

## 2017-11-11 DIAGNOSIS — M170 Bilateral primary osteoarthritis of knee: Secondary | ICD-10-CM | POA: Diagnosis not present

## 2017-11-11 DIAGNOSIS — H538 Other visual disturbances: Secondary | ICD-10-CM | POA: Diagnosis not present

## 2017-11-11 DIAGNOSIS — I119 Hypertensive heart disease without heart failure: Secondary | ICD-10-CM | POA: Diagnosis not present

## 2017-11-25 DIAGNOSIS — F0390 Unspecified dementia without behavioral disturbance: Secondary | ICD-10-CM | POA: Diagnosis not present

## 2017-12-02 DIAGNOSIS — E119 Type 2 diabetes mellitus without complications: Secondary | ICD-10-CM | POA: Diagnosis not present

## 2017-12-02 DIAGNOSIS — I119 Hypertensive heart disease without heart failure: Secondary | ICD-10-CM | POA: Diagnosis not present

## 2017-12-02 DIAGNOSIS — I10 Essential (primary) hypertension: Secondary | ICD-10-CM | POA: Diagnosis not present

## 2017-12-02 DIAGNOSIS — E785 Hyperlipidemia, unspecified: Secondary | ICD-10-CM | POA: Diagnosis not present

## 2017-12-02 DIAGNOSIS — M170 Bilateral primary osteoarthritis of knee: Secondary | ICD-10-CM | POA: Diagnosis not present

## 2017-12-02 DIAGNOSIS — E559 Vitamin D deficiency, unspecified: Secondary | ICD-10-CM | POA: Diagnosis not present

## 2017-12-02 DIAGNOSIS — M545 Low back pain: Secondary | ICD-10-CM | POA: Diagnosis not present

## 2017-12-02 DIAGNOSIS — J45909 Unspecified asthma, uncomplicated: Secondary | ICD-10-CM | POA: Diagnosis not present

## 2017-12-02 DIAGNOSIS — J302 Other seasonal allergic rhinitis: Secondary | ICD-10-CM | POA: Diagnosis not present

## 2017-12-22 DIAGNOSIS — I10 Essential (primary) hypertension: Secondary | ICD-10-CM | POA: Diagnosis not present

## 2017-12-22 DIAGNOSIS — E119 Type 2 diabetes mellitus without complications: Secondary | ICD-10-CM | POA: Diagnosis not present

## 2018-01-06 DIAGNOSIS — M545 Low back pain: Secondary | ICD-10-CM | POA: Diagnosis not present

## 2018-01-06 DIAGNOSIS — E559 Vitamin D deficiency, unspecified: Secondary | ICD-10-CM | POA: Diagnosis not present

## 2018-01-06 DIAGNOSIS — M170 Bilateral primary osteoarthritis of knee: Secondary | ICD-10-CM | POA: Diagnosis not present

## 2018-01-06 DIAGNOSIS — J302 Other seasonal allergic rhinitis: Secondary | ICD-10-CM | POA: Diagnosis not present

## 2018-01-06 DIAGNOSIS — I10 Essential (primary) hypertension: Secondary | ICD-10-CM | POA: Diagnosis not present

## 2018-01-06 DIAGNOSIS — J45909 Unspecified asthma, uncomplicated: Secondary | ICD-10-CM | POA: Diagnosis not present

## 2018-01-06 DIAGNOSIS — I119 Hypertensive heart disease without heart failure: Secondary | ICD-10-CM | POA: Diagnosis not present

## 2018-01-06 DIAGNOSIS — E785 Hyperlipidemia, unspecified: Secondary | ICD-10-CM | POA: Diagnosis not present

## 2018-01-06 DIAGNOSIS — E119 Type 2 diabetes mellitus without complications: Secondary | ICD-10-CM | POA: Diagnosis not present

## 2018-01-26 DIAGNOSIS — I10 Essential (primary) hypertension: Secondary | ICD-10-CM | POA: Diagnosis not present

## 2018-01-26 DIAGNOSIS — E119 Type 2 diabetes mellitus without complications: Secondary | ICD-10-CM | POA: Diagnosis not present

## 2018-06-09 DIAGNOSIS — M170 Bilateral primary osteoarthritis of knee: Secondary | ICD-10-CM | POA: Diagnosis not present

## 2018-06-09 DIAGNOSIS — J302 Other seasonal allergic rhinitis: Secondary | ICD-10-CM | POA: Diagnosis not present

## 2018-06-09 DIAGNOSIS — I10 Essential (primary) hypertension: Secondary | ICD-10-CM | POA: Diagnosis not present

## 2018-06-09 DIAGNOSIS — M545 Low back pain: Secondary | ICD-10-CM | POA: Diagnosis not present

## 2018-06-09 DIAGNOSIS — E785 Hyperlipidemia, unspecified: Secondary | ICD-10-CM | POA: Diagnosis not present

## 2018-06-09 DIAGNOSIS — E559 Vitamin D deficiency, unspecified: Secondary | ICD-10-CM | POA: Diagnosis not present

## 2018-06-09 DIAGNOSIS — I119 Hypertensive heart disease without heart failure: Secondary | ICD-10-CM | POA: Diagnosis not present

## 2018-06-09 DIAGNOSIS — J45909 Unspecified asthma, uncomplicated: Secondary | ICD-10-CM | POA: Diagnosis not present

## 2018-06-09 DIAGNOSIS — E119 Type 2 diabetes mellitus without complications: Secondary | ICD-10-CM | POA: Diagnosis not present

## 2018-06-14 DIAGNOSIS — E119 Type 2 diabetes mellitus without complications: Secondary | ICD-10-CM | POA: Diagnosis not present

## 2018-06-14 DIAGNOSIS — J45909 Unspecified asthma, uncomplicated: Secondary | ICD-10-CM | POA: Diagnosis not present

## 2018-06-14 DIAGNOSIS — I119 Hypertensive heart disease without heart failure: Secondary | ICD-10-CM | POA: Diagnosis not present

## 2018-06-14 DIAGNOSIS — I10 Essential (primary) hypertension: Secondary | ICD-10-CM | POA: Diagnosis not present

## 2018-06-14 DIAGNOSIS — E559 Vitamin D deficiency, unspecified: Secondary | ICD-10-CM | POA: Diagnosis not present

## 2018-06-14 DIAGNOSIS — E785 Hyperlipidemia, unspecified: Secondary | ICD-10-CM | POA: Diagnosis not present

## 2018-06-14 DIAGNOSIS — M170 Bilateral primary osteoarthritis of knee: Secondary | ICD-10-CM | POA: Diagnosis not present

## 2018-06-14 DIAGNOSIS — J302 Other seasonal allergic rhinitis: Secondary | ICD-10-CM | POA: Diagnosis not present

## 2018-06-14 DIAGNOSIS — M545 Low back pain: Secondary | ICD-10-CM | POA: Diagnosis not present

## 2018-06-27 DIAGNOSIS — G4733 Obstructive sleep apnea (adult) (pediatric): Secondary | ICD-10-CM | POA: Diagnosis not present

## 2018-06-27 DIAGNOSIS — F0391 Unspecified dementia with behavioral disturbance: Secondary | ICD-10-CM | POA: Diagnosis not present

## 2018-06-27 DIAGNOSIS — E785 Hyperlipidemia, unspecified: Secondary | ICD-10-CM | POA: Diagnosis not present

## 2018-06-27 DIAGNOSIS — E119 Type 2 diabetes mellitus without complications: Secondary | ICD-10-CM | POA: Diagnosis not present

## 2018-06-27 DIAGNOSIS — G894 Chronic pain syndrome: Secondary | ICD-10-CM | POA: Diagnosis not present

## 2018-06-27 DIAGNOSIS — E559 Vitamin D deficiency, unspecified: Secondary | ICD-10-CM | POA: Diagnosis not present

## 2018-06-27 DIAGNOSIS — I10 Essential (primary) hypertension: Secondary | ICD-10-CM | POA: Diagnosis not present

## 2018-06-27 DIAGNOSIS — J45909 Unspecified asthma, uncomplicated: Secondary | ICD-10-CM | POA: Diagnosis not present

## 2018-06-27 DIAGNOSIS — E213 Hyperparathyroidism, unspecified: Secondary | ICD-10-CM | POA: Diagnosis not present

## 2018-06-29 ENCOUNTER — Emergency Department (HOSPITAL_COMMUNITY): Payer: PPO | Admitting: Certified Registered Nurse Anesthetist

## 2018-06-29 ENCOUNTER — Emergency Department (HOSPITAL_COMMUNITY): Payer: PPO

## 2018-06-29 ENCOUNTER — Other Ambulatory Visit: Payer: Self-pay

## 2018-06-29 ENCOUNTER — Encounter (HOSPITAL_COMMUNITY)
Admission: EM | Disposition: A | Discharge status: Skilled Nursing Facility | Payer: Self-pay | Source: Skilled Nursing Facility

## 2018-06-29 ENCOUNTER — Encounter (HOSPITAL_COMMUNITY): Payer: Self-pay | Admitting: Emergency Medicine

## 2018-06-29 ENCOUNTER — Inpatient Hospital Stay (HOSPITAL_COMMUNITY)
Admission: EM | Admit: 2018-06-29 | Discharge: 2018-06-30 | DRG: 355 | Disposition: A | Discharge status: Skilled Nursing Facility | Payer: PPO | Source: Skilled Nursing Facility | Attending: Surgery | Admitting: Surgery

## 2018-06-29 DIAGNOSIS — D509 Iron deficiency anemia, unspecified: Secondary | ICD-10-CM | POA: Diagnosis not present

## 2018-06-29 DIAGNOSIS — K5730 Diverticulosis of large intestine without perforation or abscess without bleeding: Secondary | ICD-10-CM | POA: Diagnosis not present

## 2018-06-29 DIAGNOSIS — R1084 Generalized abdominal pain: Secondary | ICD-10-CM | POA: Diagnosis not present

## 2018-06-29 DIAGNOSIS — E11319 Type 2 diabetes mellitus with unspecified diabetic retinopathy without macular edema: Secondary | ICD-10-CM | POA: Diagnosis present

## 2018-06-29 DIAGNOSIS — Z794 Long term (current) use of insulin: Secondary | ICD-10-CM | POA: Diagnosis not present

## 2018-06-29 DIAGNOSIS — J45909 Unspecified asthma, uncomplicated: Secondary | ICD-10-CM | POA: Diagnosis present

## 2018-06-29 DIAGNOSIS — Z86010 Personal history of colonic polyps: Secondary | ICD-10-CM | POA: Diagnosis not present

## 2018-06-29 DIAGNOSIS — I119 Hypertensive heart disease without heart failure: Secondary | ICD-10-CM | POA: Diagnosis present

## 2018-06-29 DIAGNOSIS — Z886 Allergy status to analgesic agent status: Secondary | ICD-10-CM

## 2018-06-29 DIAGNOSIS — Z7982 Long term (current) use of aspirin: Secondary | ICD-10-CM

## 2018-06-29 DIAGNOSIS — K436 Other and unspecified ventral hernia with obstruction, without gangrene: Secondary | ICD-10-CM

## 2018-06-29 DIAGNOSIS — K3184 Gastroparesis: Secondary | ICD-10-CM | POA: Diagnosis not present

## 2018-06-29 DIAGNOSIS — I213 ST elevation (STEMI) myocardial infarction of unspecified site: Secondary | ICD-10-CM | POA: Diagnosis not present

## 2018-06-29 DIAGNOSIS — Z8042 Family history of malignant neoplasm of prostate: Secondary | ICD-10-CM | POA: Diagnosis not present

## 2018-06-29 DIAGNOSIS — F0390 Unspecified dementia without behavioral disturbance: Secondary | ICD-10-CM | POA: Diagnosis present

## 2018-06-29 DIAGNOSIS — Z79899 Other long term (current) drug therapy: Secondary | ICD-10-CM

## 2018-06-29 DIAGNOSIS — G894 Chronic pain syndrome: Secondary | ICD-10-CM | POA: Diagnosis present

## 2018-06-29 DIAGNOSIS — E1143 Type 2 diabetes mellitus with diabetic autonomic (poly)neuropathy: Secondary | ICD-10-CM | POA: Diagnosis not present

## 2018-06-29 DIAGNOSIS — Z91040 Latex allergy status: Secondary | ICD-10-CM

## 2018-06-29 DIAGNOSIS — G4733 Obstructive sleep apnea (adult) (pediatric): Secondary | ICD-10-CM | POA: Diagnosis present

## 2018-06-29 DIAGNOSIS — Z885 Allergy status to narcotic agent status: Secondary | ICD-10-CM | POA: Diagnosis not present

## 2018-06-29 DIAGNOSIS — Z888 Allergy status to other drugs, medicaments and biological substances status: Secondary | ICD-10-CM

## 2018-06-29 DIAGNOSIS — E785 Hyperlipidemia, unspecified: Secondary | ICD-10-CM | POA: Diagnosis present

## 2018-06-29 DIAGNOSIS — E213 Hyperparathyroidism, unspecified: Secondary | ICD-10-CM | POA: Diagnosis present

## 2018-06-29 DIAGNOSIS — Z791 Long term (current) use of non-steroidal anti-inflammatories (NSAID): Secondary | ICD-10-CM | POA: Diagnosis not present

## 2018-06-29 DIAGNOSIS — Z6834 Body mass index (BMI) 34.0-34.9, adult: Secondary | ICD-10-CM | POA: Diagnosis not present

## 2018-06-29 DIAGNOSIS — Z23 Encounter for immunization: Secondary | ICD-10-CM | POA: Diagnosis not present

## 2018-06-29 DIAGNOSIS — I10 Essential (primary) hypertension: Secondary | ICD-10-CM | POA: Diagnosis not present

## 2018-06-29 DIAGNOSIS — K219 Gastro-esophageal reflux disease without esophagitis: Secondary | ICD-10-CM | POA: Diagnosis present

## 2018-06-29 DIAGNOSIS — R0902 Hypoxemia: Secondary | ICD-10-CM | POA: Diagnosis not present

## 2018-06-29 DIAGNOSIS — I4510 Unspecified right bundle-branch block: Secondary | ICD-10-CM | POA: Diagnosis not present

## 2018-06-29 DIAGNOSIS — M48062 Spinal stenosis, lumbar region with neurogenic claudication: Secondary | ICD-10-CM | POA: Diagnosis not present

## 2018-06-29 DIAGNOSIS — E119 Type 2 diabetes mellitus without complications: Secondary | ICD-10-CM

## 2018-06-29 DIAGNOSIS — R1013 Epigastric pain: Secondary | ICD-10-CM | POA: Diagnosis present

## 2018-06-29 DIAGNOSIS — D508 Other iron deficiency anemias: Secondary | ICD-10-CM | POA: Diagnosis present

## 2018-06-29 DIAGNOSIS — R52 Pain, unspecified: Secondary | ICD-10-CM | POA: Diagnosis not present

## 2018-06-29 DIAGNOSIS — K439 Ventral hernia without obstruction or gangrene: Secondary | ICD-10-CM | POA: Diagnosis present

## 2018-06-29 HISTORY — PX: UMBILICAL HERNIA REPAIR: SUR1181

## 2018-06-29 HISTORY — PX: INSERTION OF MESH: SHX5868

## 2018-06-29 HISTORY — PX: UMBILICAL HERNIA REPAIR: SHX196

## 2018-06-29 LAB — COMPREHENSIVE METABOLIC PANEL
ALT: 13 U/L (ref 0–44)
AST: 25 U/L (ref 15–41)
Albumin: 3.3 g/dL — ABNORMAL LOW (ref 3.5–5.0)
Alkaline Phosphatase: 67 U/L (ref 38–126)
Anion gap: 8 (ref 5–15)
BUN: 15 mg/dL (ref 8–23)
CO2: 23 mmol/L (ref 22–32)
Calcium: 10.4 mg/dL — ABNORMAL HIGH (ref 8.9–10.3)
Chloride: 104 mmol/L (ref 98–111)
Creatinine, Ser: 0.74 mg/dL (ref 0.44–1.00)
GFR calc Af Amer: >60 mL/min (ref >60)
GFR calc non Af Amer: >60 mL/min (ref >60)
Glucose, Bld: 176 mg/dL — ABNORMAL HIGH (ref 70–99)
Potassium: 4.6 mmol/L (ref 3.5–5.1)
Sodium: 135 mmol/L (ref 135–145)
Total Bilirubin: 0.9 mg/dL (ref 0.3–1.2)
Total Protein: 6.6 g/dL (ref 6.5–8.1)

## 2018-06-29 LAB — URINALYSIS, ROUTINE W REFLEX MICROSCOPIC
Bilirubin Urine: NEGATIVE
Glucose, UA: 150 mg/dL — AB
Hgb urine dipstick: NEGATIVE
Ketones, ur: NEGATIVE mg/dL
Leukocytes, UA: NEGATIVE
Nitrite: NEGATIVE
Protein, ur: NEGATIVE mg/dL
Specific Gravity, Urine: 1.014 (ref 1.005–1.030)
pH: 8 (ref 5.0–8.0)

## 2018-06-29 LAB — GLUCOSE, CAPILLARY
Glucose-Capillary: 140 mg/dL — ABNORMAL HIGH (ref 70–99)
Glucose-Capillary: 112 mg/dL — ABNORMAL HIGH (ref 70–99)
Glucose-Capillary: 156 mg/dL — ABNORMAL HIGH (ref 70–99)
Glucose-Capillary: 160 mg/dL — ABNORMAL HIGH (ref 70–99)

## 2018-06-29 LAB — CBC
HCT: 29.9 % — ABNORMAL LOW (ref 36.0–46.0)
Hemoglobin: 9.6 g/dL — ABNORMAL LOW (ref 12.0–15.0)
MCH: 27 pg (ref 26.0–34.0)
MCHC: 32.1 g/dL (ref 30.0–36.0)
MCV: 84 fL (ref 80.0–100.0)
Platelets: 241 10*3/uL (ref 150–400)
RBC: 3.56 MIL/uL — ABNORMAL LOW (ref 3.87–5.11)
RDW: 12.6 % (ref 11.5–15.5)
WBC: 7.8 10*3/uL (ref 4.0–10.5)
nRBC: 0 % (ref 0.0–0.2)

## 2018-06-29 LAB — HEMOGLOBIN A1C
Hgb A1c MFr Bld: 6.9 % — ABNORMAL HIGH (ref 4.8–5.6)
Mean Plasma Glucose: 151.33 mg/dL

## 2018-06-29 LAB — LIPASE, BLOOD: Lipase: 35 U/L (ref 11–51)

## 2018-06-29 SURGERY — REPAIR, HERNIA, UMBILICAL, ADULT
Anesthesia: General | Site: Abdomen

## 2018-06-29 MED ORDER — INSULIN ASPART 100 UNIT/ML ~~LOC~~ SOLN
0.0000 [IU] | Freq: Three times a day (TID) | SUBCUTANEOUS | Status: DC
  Administered 2018-06-29 – 2018-06-30 (×2): 3 [IU] via SUBCUTANEOUS

## 2018-06-29 MED ORDER — EXENATIDE 5 MCG/0.02ML ~~LOC~~ SOPN: 5.0000 ug | PEN_INJECTOR | Freq: Two times a day (BID) | SUBCUTANEOUS | Status: DC

## 2018-06-29 MED ORDER — 0.9 % SODIUM CHLORIDE (POUR BTL) OPTIME
TOPICAL | Status: DC | PRN
  Administered 2018-06-29: 1000 mL

## 2018-06-29 MED ORDER — IOHEXOL 300 MG/ML  SOLN
100.0000 mL | Freq: Once | INTRAMUSCULAR | Status: AC | PRN
  Administered 2018-06-29: 100 mL via INTRAVENOUS

## 2018-06-29 MED ORDER — FENTANYL CITRATE (PF) 250 MCG/5ML IJ SOLN
INTRAMUSCULAR | Status: AC
  Filled 2018-06-29: qty 5

## 2018-06-29 MED ORDER — INSULIN ASPART 100 UNIT/ML ~~LOC~~ SOLN: 0.0000 [IU] | Freq: Three times a day (TID) | SUBCUTANEOUS | Status: DC

## 2018-06-29 MED ORDER — ONDANSETRON HCL 4 MG/2ML IJ SOLN
4.0000 mg | Freq: Once | INTRAMUSCULAR | Status: AC
  Administered 2018-06-29: 4 mg via INTRAVENOUS
  Filled 2018-06-29: qty 2

## 2018-06-29 MED ORDER — METHOCARBAMOL 500 MG PO TABS
500.0000 mg | ORAL_TABLET | Freq: Four times a day (QID) | ORAL | Status: DC | PRN
  Administered 2018-06-30 (×2): 500 mg via ORAL
  Filled 2018-06-29 (×2): qty 1

## 2018-06-29 MED ORDER — DIPHENHYDRAMINE HCL 12.5 MG/5ML PO ELIX: 12.5000 mg | ORAL_SOLUTION | Freq: Four times a day (QID) | ORAL | Status: DC | PRN

## 2018-06-29 MED ORDER — HYDROMORPHONE HCL 1 MG/ML IJ SOLN: 0.2500 mg | INTRAMUSCULAR | Status: DC | PRN

## 2018-06-29 MED ORDER — PNEUMOCOCCAL VAC POLYVALENT 25 MCG/0.5ML IJ INJ
0.5000 mL | INJECTION | INTRAMUSCULAR | Status: AC
  Administered 2018-06-30: 0.5 mL via INTRAMUSCULAR
  Filled 2018-06-29: qty 0.5

## 2018-06-29 MED ORDER — PHENYLEPHRINE 40 MCG/ML (10ML) SYRINGE FOR IV PUSH (FOR BLOOD PRESSURE SUPPORT)
PREFILLED_SYRINGE | INTRAVENOUS | Status: AC
  Filled 2018-06-29: qty 10

## 2018-06-29 MED ORDER — SIMETHICONE 80 MG PO CHEW: 40.0000 mg | CHEWABLE_TABLET | Freq: Four times a day (QID) | ORAL | Status: DC | PRN

## 2018-06-29 MED ORDER — ROCURONIUM BROMIDE 50 MG/5ML IV SOSY
PREFILLED_SYRINGE | INTRAVENOUS | Status: DC | PRN
  Administered 2018-06-29: 35 mg via INTRAVENOUS

## 2018-06-29 MED ORDER — GLIMEPIRIDE 4 MG PO TABS: 4.0000 mg | ORAL_TABLET | Freq: Every day | ORAL | Status: DC

## 2018-06-29 MED ORDER — LACTATED RINGERS IV SOLN
INTRAVENOUS | Status: DC | PRN
  Administered 2018-06-29: 07:00:00 via INTRAVENOUS

## 2018-06-29 MED ORDER — PANTOPRAZOLE SODIUM 40 MG PO TBEC
40.0000 mg | DELAYED_RELEASE_TABLET | Freq: Every day | ORAL | Status: DC
  Administered 2018-06-30: 40 mg via ORAL
  Filled 2018-06-29: qty 1

## 2018-06-29 MED ORDER — SODIUM CHLORIDE 0.9 % IV BOLUS
1000.0000 mL | Freq: Once | INTRAVENOUS | Status: AC
  Administered 2018-06-29: 1000 mL via INTRAVENOUS

## 2018-06-29 MED ORDER — FENTANYL CITRATE (PF) 100 MCG/2ML IJ SOLN
INTRAMUSCULAR | Status: DC | PRN
  Administered 2018-06-29: 100 ug via INTRAVENOUS
  Administered 2018-06-29 (×2): 50 ug via INTRAVENOUS

## 2018-06-29 MED ORDER — EPHEDRINE SULFATE 50 MG/ML IJ SOLN
INTRAMUSCULAR | Status: DC | PRN
  Administered 2018-06-29: 5 mg via INTRAVENOUS

## 2018-06-29 MED ORDER — DIPHENHYDRAMINE HCL 50 MG/ML IJ SOLN: 12.5000 mg | Freq: Four times a day (QID) | INTRAMUSCULAR | Status: DC | PRN

## 2018-06-29 MED ORDER — ONDANSETRON HCL 4 MG/2ML IJ SOLN
INTRAMUSCULAR | Status: DC | PRN
  Administered 2018-06-29: 4 mg via INTRAVENOUS

## 2018-06-29 MED ORDER — BUPIVACAINE-EPINEPHRINE 0.25% -1:200000 IJ SOLN
INTRAMUSCULAR | Status: DC | PRN
  Administered 2018-06-29: 20 mL

## 2018-06-29 MED ORDER — FENTANYL CITRATE (PF) 100 MCG/2ML IJ SOLN: 25.0000 ug | INTRAMUSCULAR | Status: DC | PRN

## 2018-06-29 MED ORDER — ENOXAPARIN SODIUM 40 MG/0.4ML ~~LOC~~ SOLN
40.0000 mg | SUBCUTANEOUS | Status: DC
  Administered 2018-06-30: 40 mg via SUBCUTANEOUS
  Filled 2018-06-29: qty 0.4

## 2018-06-29 MED ORDER — PHENYLEPHRINE HCL 10 MG/ML IJ SOLN
INTRAMUSCULAR | Status: DC | PRN
  Administered 2018-06-29: 80 ug via INTRAVENOUS
  Administered 2018-06-29: 120 ug via INTRAVENOUS

## 2018-06-29 MED ORDER — INSULIN ASPART PROT & ASPART (70-30 MIX) 100 UNIT/ML ~~LOC~~ SUSP: 50.0000 [IU] | Freq: Two times a day (BID) | SUBCUTANEOUS | Status: DC

## 2018-06-29 MED ORDER — PROPOFOL 10 MG/ML IV BOLUS
INTRAVENOUS | Status: AC
  Filled 2018-06-29: qty 20

## 2018-06-29 MED ORDER — ASPIRIN EC 81 MG PO TBEC
81.0000 mg | DELAYED_RELEASE_TABLET | Freq: Every day | ORAL | Status: DC
  Administered 2018-06-29 – 2018-06-30 (×2): 81 mg via ORAL
  Filled 2018-06-29 (×2): qty 1

## 2018-06-29 MED ORDER — FAMOTIDINE 20 MG PO TABS
20.0000 mg | ORAL_TABLET | Freq: Two times a day (BID) | ORAL | Status: DC
  Administered 2018-06-29 – 2018-06-30 (×3): 20 mg via ORAL
  Filled 2018-06-29 (×3): qty 1

## 2018-06-29 MED ORDER — METOCLOPRAMIDE HCL 5 MG PO TABS
5.0000 mg | ORAL_TABLET | Freq: Three times a day (TID) | ORAL | Status: DC
  Administered 2018-06-29 – 2018-06-30 (×3): 5 mg via ORAL
  Filled 2018-06-29 (×3): qty 1

## 2018-06-29 MED ORDER — AMLODIPINE BESYLATE 5 MG PO TABS
5.0000 mg | ORAL_TABLET | Freq: Every day | ORAL | Status: DC
  Administered 2018-06-29 – 2018-06-30 (×2): 5 mg via ORAL
  Filled 2018-06-29 (×2): qty 1

## 2018-06-29 MED ORDER — BUPIVACAINE-EPINEPHRINE (PF) 0.25% -1:200000 IJ SOLN
INTRAMUSCULAR | Status: AC
  Filled 2018-06-29: qty 30

## 2018-06-29 MED ORDER — PROPOFOL 10 MG/ML IV BOLUS
INTRAVENOUS | Status: DC | PRN
  Administered 2018-06-29: 150 mg via INTRAVENOUS

## 2018-06-29 MED ORDER — ONDANSETRON HCL 4 MG/2ML IJ SOLN
INTRAMUSCULAR | Status: AC
  Filled 2018-06-29: qty 2

## 2018-06-29 MED ORDER — ALBUTEROL SULFATE (2.5 MG/3ML) 0.083% IN NEBU: 3.0000 mL | INHALATION_SOLUTION | Freq: Every day | RESPIRATORY_TRACT | Status: DC | PRN

## 2018-06-29 MED ORDER — PROMETHAZINE HCL 25 MG/ML IJ SOLN: 6.2500 mg | INTRAMUSCULAR | Status: DC | PRN

## 2018-06-29 MED ORDER — OXYCODONE HCL 5 MG PO TABS
5.0000 mg | ORAL_TABLET | ORAL | Status: DC | PRN
  Administered 2018-06-29 – 2018-06-30 (×4): 10 mg via ORAL
  Filled 2018-06-29 (×4): qty 2

## 2018-06-29 MED ORDER — POLYETHYLENE GLYCOL 3350 17 G PO PACK: 17.0000 g | PACK | Freq: Every day | ORAL | Status: DC | PRN

## 2018-06-29 MED ORDER — POTASSIUM CHLORIDE IN NACL 20-0.9 MEQ/L-% IV SOLN
INTRAVENOUS | Status: DC
  Administered 2018-06-29 – 2018-06-30 (×2): via INTRAVENOUS
  Filled 2018-06-29 (×2): qty 1000

## 2018-06-29 MED ORDER — MENTHOL 3 MG MT LOZG: 1.0000 | LOZENGE | OROMUCOSAL | Status: DC | PRN

## 2018-06-29 MED ORDER — MORPHINE SULFATE (PF) 4 MG/ML IV SOLN
4.0000 mg | Freq: Once | INTRAVENOUS | Status: AC
  Administered 2018-06-29: 4 mg via INTRAVENOUS
  Filled 2018-06-29: qty 1

## 2018-06-29 MED ORDER — INSULIN ASPART 100 UNIT/ML ~~LOC~~ SOLN
0.0000 [IU] | Freq: Every day | SUBCUTANEOUS | Status: DC
  Administered 2018-06-30: 2 [IU] via SUBCUTANEOUS

## 2018-06-29 MED ORDER — ONDANSETRON 4 MG PO TBDP: 4.0000 mg | ORAL_TABLET | Freq: Four times a day (QID) | ORAL | Status: DC | PRN

## 2018-06-29 MED ORDER — ROSUVASTATIN CALCIUM 5 MG PO TABS
5.0000 mg | ORAL_TABLET | Freq: Every day | ORAL | Status: DC
  Administered 2018-06-29 – 2018-06-30 (×2): 5 mg via ORAL
  Filled 2018-06-29 (×2): qty 1

## 2018-06-29 MED ORDER — ONDANSETRON HCL 4 MG/2ML IJ SOLN: 4.0000 mg | Freq: Four times a day (QID) | INTRAMUSCULAR | Status: DC | PRN

## 2018-06-29 MED ORDER — MEPERIDINE HCL 50 MG/ML IJ SOLN: 6.2500 mg | INTRAMUSCULAR | Status: DC | PRN

## 2018-06-29 MED ORDER — SUCCINYLCHOLINE CHLORIDE 20 MG/ML IJ SOLN
INTRAMUSCULAR | Status: DC | PRN
  Administered 2018-06-29: 80 mg via INTRAVENOUS

## 2018-06-29 MED ORDER — CEFAZOLIN SODIUM-DEXTROSE 2-4 GM/100ML-% IV SOLN
2.0000 g | INTRAVENOUS | Status: AC
  Administered 2018-06-29: 2 g via INTRAVENOUS
  Filled 2018-06-29: qty 100

## 2018-06-29 MED ORDER — SUGAMMADEX SODIUM 200 MG/2ML IV SOLN
INTRAVENOUS | Status: DC | PRN
  Administered 2018-06-29: 200 mg via INTRAVENOUS
  Administered 2018-06-29: 100 mg via INTRAVENOUS

## 2018-06-29 MED ORDER — HYDRALAZINE HCL 20 MG/ML IJ SOLN: 10.0000 mg | INTRAMUSCULAR | Status: DC | PRN

## 2018-06-29 MED ORDER — METFORMIN HCL 500 MG PO TABS: 1000.0000 mg | ORAL_TABLET | Freq: Two times a day (BID) | ORAL | Status: DC

## 2018-06-29 MED ORDER — LIDOCAINE 2% (20 MG/ML) 5 ML SYRINGE
INTRAMUSCULAR | Status: DC | PRN
  Administered 2018-06-29: 100 mg via INTRAVENOUS

## 2018-06-29 MED ORDER — AZELASTINE-FLUTICASONE 137-50 MCG/ACT NA SUSP: 2.0000 | Freq: Every day | NASAL | Status: DC

## 2018-06-29 SURGICAL SUPPLY — 43 items
ADH SKN CLS APL DERMABOND .7 (GAUZE/BANDAGES/DRESSINGS) ×1
CANISTER SUCT 3000ML PPV (MISCELLANEOUS) IMPLANT
CHLORAPREP W/TINT 26ML (MISCELLANEOUS) ×2 IMPLANT
COVER SURGICAL LIGHT HANDLE (MISCELLANEOUS) ×2 IMPLANT
COVER WAND RF STERILE (DRAPES) ×2 IMPLANT
DERMABOND ADVANCED (GAUZE/BANDAGES/DRESSINGS) ×1
DERMABOND ADVANCED .7 DNX12 (GAUZE/BANDAGES/DRESSINGS) ×1 IMPLANT
DRAPE LAPAROTOMY 100X72 PEDS (DRAPES) ×2 IMPLANT
DRAPE UTILITY XL STRL (DRAPES) ×2 IMPLANT
ELECT BLADE 4.0 EZ CLEAN MEGAD (MISCELLANEOUS) ×2
ELECT CAUTERY BLADE 6.4 (BLADE) ×2 IMPLANT
ELECT REM PT RETURN 9FT ADLT (ELECTROSURGICAL) ×2
ELECTRODE BLDE 4.0 EZ CLN MEGD (MISCELLANEOUS) IMPLANT
ELECTRODE REM PT RTRN 9FT ADLT (ELECTROSURGICAL) ×1 IMPLANT
GAUZE SPONGE 4X4 12PLY STRL (GAUZE/BANDAGES/DRESSINGS) IMPLANT
GLOVE BIO SURGEON STRL SZ8 (GLOVE) ×1 IMPLANT
GLOVE BIOGEL PI IND STRL 8 (GLOVE) ×1 IMPLANT
GLOVE BIOGEL PI INDICATOR 8 (GLOVE) ×1
GOWN STRL REUS W/ TWL LRG LVL3 (GOWN DISPOSABLE) ×1 IMPLANT
GOWN STRL REUS W/ TWL XL LVL3 (GOWN DISPOSABLE) ×1 IMPLANT
GOWN STRL REUS W/TWL LRG LVL3 (GOWN DISPOSABLE) ×2
GOWN STRL REUS W/TWL XL LVL3 (GOWN DISPOSABLE) ×2
KIT BASIN OR (CUSTOM PROCEDURE TRAY) ×2 IMPLANT
KIT TURNOVER KIT B (KITS) ×2 IMPLANT
MESH VENTRALEX ST 2.5 CRC MED (Mesh General) ×1 IMPLANT
NDL HYPO 25GX1X1/2 BEV (NEEDLE) ×1 IMPLANT
NEEDLE HYPO 25GX1X1/2 BEV (NEEDLE) ×2 IMPLANT
NS IRRIG 1000ML POUR BTL (IV SOLUTION) ×2 IMPLANT
PACK SURGICAL SETUP 50X90 (CUSTOM PROCEDURE TRAY) ×2 IMPLANT
PAD ARMBOARD 7.5X6 YLW CONV (MISCELLANEOUS) ×2 IMPLANT
PENCIL BUTTON HOLSTER BLD 10FT (ELECTRODE) ×2 IMPLANT
PENCIL SMOKE EVACUATOR (MISCELLANEOUS) ×1 IMPLANT
SLEEVE SUCTION 125 (MISCELLANEOUS) ×1 IMPLANT
SPONGE LAP 18X18 X RAY DECT (DISPOSABLE) ×2 IMPLANT
SUT MON AB 4-0 PC3 18 (SUTURE) ×2 IMPLANT
SUT NOVA NAB DX-16 0-1 5-0 T12 (SUTURE) ×3 IMPLANT
SUT VIC AB 3-0 SH 27 (SUTURE) ×2
SUT VIC AB 3-0 SH 27X BRD (SUTURE) ×1 IMPLANT
SYR CONTROL 10ML LL (SYRINGE) ×2 IMPLANT
TOWEL OR 17X24 6PK STRL BLUE (TOWEL DISPOSABLE) ×2 IMPLANT
TOWEL OR 17X26 10 PK STRL BLUE (TOWEL DISPOSABLE) ×2 IMPLANT
TUBE CONNECTING 12X1/4 (SUCTIONS) IMPLANT
YANKAUER SUCT BULB TIP NO VENT (SUCTIONS) ×1 IMPLANT

## 2018-06-29 NOTE — Anesthesia Postprocedure Evaluation (Signed)
Anesthesia Post Note  Patient: Katelyn [name]  Procedure(s) Performed: HERNIA REPAIR UMBILICAL ADULT, incarcerated, (N/A Abdomen) INSERTION OF MESH (N/A Abdomen)     Patient location during evaluation: PACU Anesthesia Type: General Level of consciousness: sedated and patient cooperative Pain management: pain level controlled Vital Signs Assessment: post-procedure vital signs reviewed and stable Respiratory status: spontaneous breathing Cardiovascular status: stable Anesthetic complications: no    Last Vitals:  Vitals:   06/29/18 0930 06/29/18 0937  BP:  (!) 115/58  Pulse: 89 90  Resp: (!) 9 16  Temp:    SpO2: 99% 100%    Last Pain:  Vitals:   06/29/18 0945  TempSrc:   PainSc: 0-No pain                 Nolon Nations

## 2018-06-29 NOTE — ED Notes (Signed)
Pt's belongings in security office and pod F locker room. See belongings flowsheet.

## 2018-06-29 NOTE — Op Note (Signed)
Katelyn [name] 12-14-1945 [id number] 06/29/2018  Preoperative diagnosis: Incarcerated ventral hernia  Postoperative diagnosis: Same  Procedure: Ventral hernia Repair with 6.4 cm ventralex coated mesh  Surgeon: Erroll Luna, MD, FACS  Anesthesia: General and 0.25% Sensorcaine with local   Clinical History and Indications: Patientpresents with incarcerated ventral hernia to the emergency room this morning.  She had a 1 day history of abdominal pain and a bulge above her umbilicus.  She was evaluated by CT scan examination which showed an incarcerated ventral hernia with possible bowel involvement.  This is not reducible clinically therefore recommended surgical reduction and repair. The risk of hernia repair include bleeding,  Infection,   Recurrence of the hernia,  Mesh use, chronic pain,  Organ injury,  Bowel injury,  Bladder injury,   nerve injury with numbness around the incision,  Death,  and worsening of preexisting  medical problems.  The alternatives to surgery have been discussed as well..  Long term expectations of both operative and non operative treatments have been discussed.   The patient agrees to proceed.  Procedure: The patient was seen in the preoperative area and the plans for the procedure reviewed again. She  had no further questions. I marked the area of the umbilicus as the operative site.  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 She wishes to prodeed.  The patient was taken to the operating room and after satisfactory general anesthesia had been obtained the area was clipped as needed, prepped and draped. The timeout was performed.  I used some local anesthesia to help with postoperative pain management. This was infiltrated around the supraumbilical area and additional infiltrated as I worked.  A vertical incision was made on the superior aspect of the umbilicus. The skin was elevated off of the hernia sac. The  hernia sac was dissected free of the subcutaneous tissues.  The hernia defect was 2 cm.  There was small bowel within the hernia sac this was viable and easily reduced.  No signs of obstruction, strangulation or loss of viability.  The fascia was elevated off this preperitoneal fat.  A 6.4 cm circular ventral Lex mesh was placed.  This was placed in the subfascial position.  This was secured with #1 Novafil.  The fascia was closed over the mesh with #1 Novafil.  Once the repair was complete the incision was closed by using some 3-0 Vicryl subcutaneous and 4-0 Monocryl subcuticular sutures.  Dermabond applied.  The patient tolerated the procedure well. There were no operative complications. There was minimal blood loss. All counts were correct. She was taken to the PACU in satisfactory condition.  Erroll Luna , MD, FACS 06/29/2018 8:59 AM

## 2018-06-29 NOTE — ED Provider Notes (Signed)
[facility] EMERGENCY DEPARTMENT Provider Note   CSN: [id number] Arrival date & time: 06/29/18  0216     History   Chief Complaint Chief Complaint  Patient presents with  . Abdominal Pain    HPI Katelyn [name] is a 72 y.o. female.  Patient is a 72 year old female with past medical history of dementia, diabetes, hypertension.  She was sent from her memory care center for evaluation of abdominal pain and vomiting.  This started earlier this evening.  The staff there was concerned about a lump in her abdomen just to the right of her umbilicus.  Patient denies to me she has had any prior surgeries on her abdomen.  History is somewhat limited secondary to dementia.  The history is provided by the patient.  Abdominal Pain   This is a new problem. The current episode started 3 to 5 hours ago. The problem occurs constantly. The problem has been rapidly worsening. The pain is associated with an unknown factor. The pain is located in the epigastric region. The quality of the pain is cramping. The pain is moderate. Associated symptoms include nausea and vomiting. Pertinent negatives include fever and constipation. The symptoms are aggravated by certain positions and palpation. Nothing relieves the symptoms.    Past Medical History:  Diagnosis Date  . Allergic rhinitis   . Asthma   . Chronic pain syndrome   . Colon polyp   . Diabetes mellitus   . H. pylori infection   . Hyperlipidemia   . Hyperparathyroidism ([place])   . Hypertension   . Hypertensive heart disease without heart failure   . Morbid obesity ([place])   . Sleep apnea   . Vitamin D deficiency     Patient Active Problem List   Diagnosis Date Noted  . Dehydration 07/16/2017  . Hypercalcemia 07/16/2017  . Diabetic gastroparesis associated with type 2 diabetes mellitus ([place]) 07/16/2017  . Asthma 07/15/2017  . Diabetes mellitus without complication ([place]) 10/62/6948  . GERD (gastroesophageal reflux disease)  07/15/2017  . Hyperparathyroidism ([place]) 07/15/2017  . Acute metabolic encephalopathy 54/62/7035  . Abdominal pain 07/15/2017  . Diabetic neuropathy ([place]) 05/07/2015  . Achilles tendinitis of left lower extremity 03/30/2014  . Spinal stenosis, lumbar region, with neurogenic claudication 02/16/2014  . Other chronic postoperative pain 02/16/2014  . Bilateral knee contractures 02/16/2014  . Obstructive sleep apnea 06/26/2013  . TRIGGER FINGER 04/29/2010  . Allergic rhinitis, cause unspecified 03/24/2010  . OSTEOARTHRITIS 02/10/2010  . DENTAL CARIES 09/24/2009  . DIABETIC  RETINOPATHY 04/29/2009  . ONYCHOMYCOSIS, TOENAILS 04/08/2009  . DYSLIPIDEMIA 04/08/2009  . EDEMA 04/08/2009  . HYPERTENSION 12/24/2008  . ELECTROCARDIOGRAM, ABNORMAL 12/17/2008  . SCIATICA, RIGHT 07/11/2007  . Type II or unspecified type diabetes mellitus without mention of complication, not stated as uncontrolled 04/11/2007  . ANEMIA DUE TO DIETARY IRON DEFICIENCY 04/11/2007  . HYPERTENSION, BENIGN ESSENTIAL 04/11/2007  . OSTEOARTHRITIS, GENERALIZED 04/11/2007    Past Surgical History:  Procedure Laterality Date  . JOINT REPLACEMENT       OB History   None      Home Medications    Prior to Admission medications   Medication Sig Start Date End Date Taking? Authorizing Provider  albuterol (PROVENTIL HFA;VENTOLIN HFA) 108 (90 BASE) MCG/ACT inhaler Inhale 2 puffs into the lungs daily as needed for wheezing.     [provider]  amLODipine (NORVASC) 5 MG tablet Take 5 mg by mouth daily.    [provider]  aspirin EC 81  MG tablet Take 81 mg by mouth daily.     [provider]  Azelastine-Fluticasone (DYMISTA) 137-50 MCG/ACT SUSP Place 2 sprays into both nostrils at bedtime. Patient not taking: Reported on 07/15/2017 06/26/13   Baird Lyons D, MD  diclofenac sodium (VOLTAREN) 1 % GEL Apply 1 application topically 2 (two) times daily. 10/30/16   [provider]  exenatide  (BYETTA) 5 MCG/0.02ML SOPN injection Inject 5 mcg into the skin 2 (two) times daily with a meal.    [provider]  famotidine (PEPCID) 20 MG tablet Take 20 mg by mouth 2 (two) times daily.    [provider]  glimepiride (AMARYL) 4 MG tablet Take 4 mg by mouth daily. 10/29/16   [provider]  insulin aspart protamine-insulin aspart (NOVOLOG 70/30) (70-30) 100 UNIT/ML injection Inject 50 Units into the skin 2 (two) times daily.     [provider]  metFORMIN (GLUCOPHAGE) 1000 MG tablet Take 1,000 mg by mouth 2 (two) times daily with a meal.    [provider]  metoCLOPramide (REGLAN) 5 MG tablet Take 1 tablet (5 mg total) 3 (three) times daily before meals by mouth. 07/17/17   Hall, Carole N, DO  mometasone (NASONEX) 50 MCG/ACT nasal spray Place 1 spray into the nose 2 (two) times daily.    [provider]  Multiple Vitamin (MULTIVITAMIN) tablet Take 1 tablet by mouth daily.    [provider]  potassium chloride (K-DUR) 10 MEQ tablet Take 1 tablet (10 mEq total) daily by mouth. 07/17/17   Kayleen Memos, DO    Family History Family History  Problem Relation Age of Onset  . Prostate cancer Father     Social History Social History   Tobacco Use  . Smoking status: Never Smoker  . Smokeless tobacco: Never Used  Substance Use Topics  . Alcohol use: No  . Drug use: No     Allergies   Aspirin; Hydrochlorothiazide; Ibuprofen; Latex; and Tramadol   Review of Systems Review of Systems  Constitutional: Negative for fever.  Gastrointestinal: Positive for abdominal pain, nausea and vomiting. Negative for constipation.  All other systems reviewed and are negative.    Physical Exam Updated Vital Signs BP 138/72 (BP Location: Right Arm)   Pulse 93   Temp 98.7 F (37.1 C) (Oral)   Resp (!) 9   SpO2 99%   Physical Exam  Constitutional: She is oriented to person, place, and time. She appears well-developed and  well-nourished. No distress.  HENT:  Head: Normocephalic and atraumatic.  Neck: Normal range of motion. Neck supple.  Cardiovascular: Normal rate and regular rhythm. Exam reveals no gallop and no friction rub.  No murmur heard. Pulmonary/Chest: Effort normal and breath sounds normal. No respiratory distress. She has no wheezes.  Abdominal: Soft. Bowel sounds are normal. She exhibits no distension. There is tenderness in the periumbilical area. There is no rigidity, no rebound and no guarding.  There is a palpable mass in the mid abdomen just to the right of the umbilicus.  This is tender to the touch and likely represents a ventral hernia.  Musculoskeletal: Normal range of motion.  Neurological: She is alert and oriented to person, place, and time.  Skin: Skin is warm and dry. She is not diaphoretic.  Nursing note and vitals reviewed.    ED Treatments / Results  Labs (all labs ordered are listed, but only abnormal results are displayed) Labs Reviewed  LIPASE, BLOOD  COMPREHENSIVE METABOLIC PANEL  CBC  URINALYSIS, ROUTINE W REFLEX MICROSCOPIC    EKG None  Radiology No results found.  Procedures Procedures (including critical care time)  Medications Ordered in ED Medications  sodium chloride 0.9 % bolus 1,000 mL (has no administration in time range)  ondansetron (ZOFRAN) injection 4 mg (has no administration in time range)  morphine 4 MG/ML injection 4 mg (has no administration in time range)     Initial Impression / Assessment and Plan / ED Course  I have reviewed the triage vital signs and the nursing notes.  Pertinent labs & imaging results that were available during my care of the patient were reviewed by me and considered in my medical decision making (see chart for details).  Patient with what appears to be a ventral hernia that contains what appears to be incarcerated small bowel.  This finding was discussed with Dr. Ninfa Linden from general surgery who has evaluated  the patient.  She will be admitted to the general surgery service for further evaluation.  Final Clinical Impressions(s) / ED Diagnoses   Final diagnoses:  None    ED Discharge Orders    None       Veryl Speak, MD 06/29/18 [phone]

## 2018-06-29 NOTE — Interval H&P Note (Signed)
History and Physical Interval Note:  06/29/2018 7:27 AM  Katelyn [name]  has presented today for surgery, with the diagnosis of incarcerated umbilical hernia  The various methods of treatment have been discussed with the patient and family. After consideration of risks, benefits and other options for treatment, the patient has consented to  Procedure(s): HERNIA REPAIR UMBILICAL ADULT, incarcerated, (N/A) INSERTION OF MESH (N/A) as a surgical intervention .  The patient's history has been reviewed, patient examined, no change in status, stable for surgery.  I have reviewed the patient's chart and labs.  Questions were answered to the patient's satisfaction.     [place]

## 2018-06-29 NOTE — ED Triage Notes (Signed)
Pt arrives via EMS from [facility] with emesis x2 and mid abd pain starting approx 3 hours ago. Hx dementia. EMS gave 8MG  zofran and 100MCG fentanyl for pain

## 2018-06-29 NOTE — Anesthesia Procedure Notes (Signed)
Procedure Name: Intubation Date/Time: 06/29/2018 7:51 AM Performed by: Inda Coke, CRNA Pre-anesthesia Checklist: Patient identified, Emergency Drugs available, Suction available and Patient being monitored Patient Re-evaluated:Patient Re-evaluated prior to induction Oxygen Delivery Method: Circle System Utilized Preoxygenation: Pre-oxygenation with 100% oxygen Induction Type: IV induction and Rapid sequence Ventilation: Mask ventilation without difficulty Laryngoscope Size: Mac and 3 Grade View: Grade I Tube type: Oral Tube size: 7.0 mm Number of attempts: 1 Airway Equipment and Method: Stylet and Oral airway Placement Confirmation: ETT inserted through vocal cords under direct vision,  positive ETCO2 and breath sounds checked- equal and bilateral Secured at: 21 cm Tube secured with: Tape Dental Injury: Teeth and Oropharynx as per pre-operative assessment

## 2018-06-29 NOTE — Anesthesia Preprocedure Evaluation (Signed)
Anesthesia Evaluation  Patient identified by MRN, date of birth, ID band Patient awake    Reviewed: Allergy & Precautions, NPO status , Patient's Chart, lab work & pertinent test results  Airway Mallampati: II  TM Distance: >3 FB Neck ROM: Full    Dental  (+) Dental Advisory Given   Pulmonary asthma , sleep apnea ,    Pulmonary exam normal breath sounds clear to auscultation       Cardiovascular hypertension, Pt. on medications Normal cardiovascular exam Rhythm:Regular Rate:Normal     Neuro/Psych negative neurological ROS  negative psych ROS   GI/Hepatic Neg liver ROS, GERD  ,  Endo/Other  diabetes, Type 2  Renal/GU negative Renal ROS     Musculoskeletal  (+) Arthritis ,   Abdominal   Peds  Hematology  (+) Blood dyscrasia, anemia ,   Anesthesia Other Findings   Reproductive/Obstetrics negative OB ROS                             Anesthesia Physical Anesthesia Plan  ASA: III  Anesthesia Plan: General   Post-op Pain Management:    Induction: Intravenous  PONV Risk Score and Plan: 4 or greater and Ondansetron, Dexamethasone and Treatment may vary due to age or medical condition  Airway Management Planned: Oral ETT  Additional Equipment: None  Intra-op Plan:   Post-operative Plan: Extubation in OR  Informed Consent: I have reviewed the patients History and Physical, chart, labs and discussed the procedure including the risks, benefits and alternatives for the proposed anesthesia with the patient or authorized representative who has indicated his/her understanding and acceptance.   Dental advisory given  Plan Discussed with: CRNA  Anesthesia Plan Comments:         Anesthesia Quick Evaluation

## 2018-06-29 NOTE — Transfer of Care (Signed)
Immediate Anesthesia Transfer of Care Note  Patient: Katelyn [name]  Procedure(s) Performed: HERNIA REPAIR UMBILICAL ADULT, incarcerated, (N/A Abdomen) INSERTION OF MESH (N/A Abdomen)  Patient Location: PACU  Anesthesia Type:General  Level of Consciousness: awake and drowsy  Airway & Oxygen Therapy: Patient Spontanous Breathing and Patient connected to nasal cannula oxygen  Post-op Assessment: Report given to RN and Post -op Vital signs reviewed and stable  Post vital signs: Reviewed and stable  Last Vitals:  Vitals Value Taken Time  BP 128/65 06/29/2018  8:52 AM  Temp    Pulse 86 06/29/2018  8:55 AM  Resp 9 06/29/2018  8:55 AM  SpO2 100 % 06/29/2018  8:55 AM  Vitals shown include unvalidated device data.  Last Pain:  Vitals:   06/29/18 0231  TempSrc: Oral         Complications: No apparent anesthesia complications

## 2018-06-29 NOTE — H&P (Signed)
Medical Consultation   Katelyn [name]  YQM:[id number]  DOB: April 06, 1946  DOA: 06/29/2018  PCP: Benito Mccreedy, MD    Requesting physician: [facility] - surgery  Reason for consultation: Incarcerated ventral hernia.  Requested to do medical management.   History of Present Illness: Katelyn [name] is an 72 y.o. female with dementia, HTN, DM, OSA, presented from facility with umbilical hernia.  She was awake in the PACU at the time of my exam.  Her only complaint was a need to go to the bathroom and a request to be placed on the bedpan.  She denied pain.   Review of Systems:  ROS As per HPI otherwise 10 point review of systems negative.  This may be hindered by her dementia  PMH, PSH, FH, and SH were reviewed in Epic   Past Medical History: Past Medical History:  Diagnosis Date  . Allergic rhinitis   . Asthma   . Chronic pain syndrome   . Colon polyp   . Diabetes mellitus   . H. pylori infection   . Hyperlipidemia   . Hyperparathyroidism ([place])   . Hypertension   . Hypertensive heart disease without heart failure   . Morbid obesity ([place])   . Sleep apnea   . Vitamin D deficiency     Past Surgical History: Past Surgical History:  Procedure Laterality Date  . JOINT REPLACEMENT    . UMBILICAL HERNIA REPAIR  06/29/2018     Allergies:   Allergies  Allergen Reactions  . Aspirin Other (See Comments)    Stomach irritation  . Hydrochlorothiazide     REACTION: 2 trials of HCTZ with pt intolerance secondary to dizzines  . Ibuprofen Other (See Comments)     Causes "shakiness"  . Latex Itching    Irritates, Redness  . Tramadol     Stomach upset     Social History:  reports that she has never smoked. She has never used smokeless tobacco. She reports that she does not drink alcohol or use drugs.   Family History: Family History  Problem Relation Age of Onset  . Prostate cancer Father       Physical Exam: Vitals:   06/29/18 1230  06/29/18 1235 06/29/18 1245 06/29/18 1314  BP:    117/71  Pulse: 82  82 84  Resp: (!) 9  10 16   Temp:  (!) 97.5 F (36.4 C)  98.3 F (36.8 C)  TempSrc:    Oral  SpO2: 98%  100% 100%  Weight:    93.9 kg  Height:    5\' 5"  (1.651 m)    Constitutional: Alert and awake, oriented x2, not in any acute distress. Eyes:  EOMI, irises appear normal, anicteric sclera,  ENMT: external ears and nose appear normal, normal hearing, Lips appear normal Neck: neck appears normal, no masses, normal ROM, no thyromegaly, no JVD  CVS: S1-S2 clear, no murmur rubs or gallops, no LE edema, normal pedal pulses  Respiratory:  clear to auscultation bilaterally, no wheezing, rales or rhonchi. Respiratory effort normal. No accessory muscle use.  Abdomen: Ice pack on lower abdomen with well approximated midline periumbical incision that is C/D/I Musculoskeletal: : no cyanosis, clubbing or edema noted bilaterally Psych: judgement and insight appear normal, stable mood and affect, mental status Skin: no rashes or lesions or ulcers, no induration or nodules    Data reviewed:  I have personally reviewed the recent  labs and imaging studies  Pertinent Labs:   Glucose 176 Albumin 3.3 CMP otherwise essentially normal Hgb 9.6, CBC otherwise WNL UA: glucose 150  Inpatient Medications:   Scheduled Meds: . amLODipine  5 mg Oral Daily  . aspirin EC  81 mg Oral Daily  . [START ON 06/30/2018] enoxaparin (LOVENOX) injection  40 mg Subcutaneous Q24H  . famotidine  20 mg Oral BID  . insulin aspart  0-15 Units Subcutaneous TID WC  . metFORMIN  1,000 mg Oral BID WC  . metoCLOPramide  5 mg Oral TID AC  . [START ON 06/30/2018] pneumococcal 23 valent vaccine  0.5 mL Intramuscular Tomorrow-1000   Continuous Infusions: . 0.9 % NaCl with KCl 20 mEq / L       Radiological Exams on Admission: Ct Abdomen Pelvis W Contrast  Result Date: 06/29/2018 CLINICAL DATA:  72 year old female with generalized abdominal pain. EXAM: CT  ABDOMEN AND PELVIS WITH CONTRAST TECHNIQUE: Multidetector CT imaging of the abdomen and pelvis was performed using the standard protocol following bolus administration of intravenous contrast. CONTRAST:  151mL OMNIPAQUE IOHEXOL 300 MG/ML  SOLN COMPARISON:  CT of the abdomen pelvis dated 07/15/2017 FINDINGS: Lower chest: The visualized lung bases are clear. No intra-abdominal free air or free fluid. Hepatobiliary: No focal liver abnormality is seen. No gallstones, gallbladder wall thickening, or biliary dilatation. Pancreas: Unremarkable. No pancreatic ductal dilatation or surrounding inflammatory changes. Spleen: Normal in size without focal abnormality. Adrenals/Urinary Tract: The adrenal glands are unremarkable. There is no hydronephrosis on either side. There is symmetric enhancement and excretion contrast by both kidneys. The visualized ureters and urinary bladder appear unremarkable. Stomach/Bowel: There is moderate stool throughout the colon. There are small scattered colonic diverticula without active inflammatory changes. There is focal protrusion of a short segment of small bowel through the umbilical hernia which is new compared to the prior CT. There is mild stranding of the fat in the umbilical hernia. Findings concerning for a degree of strangulation or incarceration. There is fecalized normal caliber loops of small bowel in the mid abdomen which may represent increased transit time or small intestinal bacterial overgrowth. There is no evidence of bowel obstruction at this time. Normal appendix. Vascular/Lymphatic: Mild aortoiliac atherosclerotic disease. No portal venous gas. There is no adenopathy. Reproductive: Slightly heterogeneous uterus with small fibroids. The ovaries are grossly unremarkable as visualized. Other: Mild diffuse subcutaneous edema. Musculoskeletal: Osteopenia with degenerative changes of the spine. There is compression of the superior endplate of the L1 vertebra with approximately  30-40% loss of vertebral body height. This is age indeterminate but new since the prior CT. No retropulsed fragment. Correlation with clinical exam and point tenderness recommended. IMPRESSION: 1. Interval protrusion of a short segment of small bowel into the umbilical hernia with apparent mild associated inflammatory changes, likely a degree of strangulation or incarceration. Clinical correlation is recommended. No bowel obstruction. No fluid collection. 2. Six scattered colonic diverticula without active inflammatory changes. Normal appendix. 3. Age indeterminate L1 compression fracture, new from prior CT. Correlation with clinical exam and point tenderness recommended. Electronically Signed   By: Anner Crete M.D.   On: 06/29/2018 04:45    Impression/Recommendations Principal Problem:   Ventral hernia Active Problems:   ANEMIA DUE TO DIETARY IRON DEFICIENCY   Essential hypertension   Diabetes mellitus without complication (HCC)   Dementia without behavioral disturbance (HCC)  Incarcerated ventral hernia -s/p ventral hernia repair this AM by Dr. Brantley Stage -Management as per surgery  HTN -Continue Norvasc -Hold HCTZ  DM -Will check A1c - prior 9.2 in 11/18 -hold Glucophage -Cover with moderate-scale SSI including qhs insulin  Dementia -She does not appear to be taking medications for this issue  Anemia -Hgb 9.6, previously 10.6 in 11/18 -Will trend    Thank you for this consultation.  Our [facility] hospitalist team will follow the patient with you.   Time Spent: 50 minutes  Karmen Bongo M.D. [facility] 06/29/2018, 3:00 PM

## 2018-06-29 NOTE — H&P (Signed)
Katelyn [name] is an 72 y.o. female.   Chief Complaint: abdominal pain HPI: This is a 72 year old female who is in a nursing facility secondary to dementia.  She presents with a several hour history of abdominal pain with nausea and vomiting.  Staff at the facility noticed a lump in her abdomen just above the umbilicus so she was to the emergency department.  The patient is currently awake and alert and will answer some questions.  She has mild focal abdominal pain at the site of the hernia.  The rest of her history is slightly limited secondary to her dementia.  Her sister Vaughan Basta I believe is her power of attorney.  Past Medical History:  Diagnosis Date  . Allergic rhinitis   . Asthma   . Chronic pain syndrome   . Colon polyp   . Diabetes mellitus   . H. pylori infection   . Hyperlipidemia   . Hyperparathyroidism ([place])   . Hypertension   . Hypertensive heart disease without heart failure   . Morbid obesity ([place])   . Sleep apnea   . Vitamin D deficiency     Past Surgical History:  Procedure Laterality Date  . JOINT REPLACEMENT      Family History  Problem Relation Age of Onset  . Prostate cancer Father    Social History:  reports that she has never smoked. She has never used smokeless tobacco. She reports that she does not drink alcohol or use drugs.  Allergies:  Allergies  Allergen Reactions  . Aspirin Other (See Comments)    Stomach irritation  . Hydrochlorothiazide     REACTION: 2 trials of HCTZ with pt intolerance secondary to dizzines  . Ibuprofen Other (See Comments)     Causes "shakiness"  . Latex Itching    Irritates, Redness  . Tramadol     Stomach upset     (Not in a hospital admission)  Results for orders placed or performed during the hospital encounter of 06/29/18 (from the past 48 hour(s))  Lipase, blood     Status: None   Collection Time: 06/29/18  2:36 AM  Result Value Ref Range   Lipase 35 11 - 51 U/L    Comment: Performed at [facility], [address], [place],  [address]  Comprehensive metabolic panel     Status: Abnormal   Collection Time: 06/29/18  2:36 AM  Result Value Ref Range   Sodium 135 135 - 145 mmol/L   Potassium 4.6 3.5 - 5.1 mmol/L    Comment: SPECIMEN HEMOLYZED. HEMOLYSIS MAY AFFECT INTEGRITY OF RESULTS.   Chloride 104 98 - 111 mmol/L   CO2 23 22 - 32 mmol/L   Glucose, Bld 176 (H) 70 - 99 mg/dL   BUN 15 8 - 23 mg/dL   Creatinine, Ser 0.74 0.44 - 1.00 mg/dL   Calcium 10.4 (H) 8.9 - 10.3 mg/dL   Total Protein 6.6 6.5 - 8.1 g/dL   Albumin 3.3 (L) 3.5 - 5.0 g/dL   AST 25 15 - 41 U/L   ALT 13 0 - 44 U/L   Alkaline Phosphatase 67 38 - 126 U/L   Total Bilirubin 0.9 0.3 - 1.2 mg/dL   GFR calc non Af Amer >60 >60 mL/min   GFR calc Af Amer >60 >60 mL/min    Comment: (NOTE) The eGFR has been calculated using the CKD EPI equation. This calculation has not been validated in all clinical situations. eGFR's persistently <60 mL/min signify  possible Chronic Kidney Disease.    Anion gap 8 5 - 15    Comment: Performed at [facility] [address] [place], [place] [address]  CBC     Status: Abnormal   Collection Time: 06/29/18  2:36 AM  Result Value Ref Range   WBC 7.8 4.0 - 10.5 K/uL   RBC 3.56 (L) 3.87 - 5.11 MIL/uL   Hemoglobin 9.6 (L) 12.0 - 15.0 g/dL   HCT 29.9 (L) 36.0 - 46.0 %   MCV 84.0 80.0 - 100.0 fL   MCH 27.0 26.0 - 34.0 pg   MCHC 32.1 30.0 - 36.0 g/dL   RDW 12.6 11.5 - 15.5 %   Platelets 241 150 - 400 K/uL   nRBC 0.0 0.0 - 0.2 %    Comment: Performed at [facility], [place] [address] [place], [place] [address]  Urinalysis, Routine w reflex microscopic     Status: Abnormal   Collection Time: 06/29/18  4:21 AM  Result Value Ref Range   Color, Urine STRAW (A) YELLOW   APPearance CLEAR CLEAR   Specific Gravity, Urine 1.014 1.005 - 1.030   pH 8.0 5.0 - 8.0   Glucose, UA 150 (A) NEGATIVE mg/dL   Hgb urine dipstick NEGATIVE NEGATIVE   Bilirubin Urine NEGATIVE  NEGATIVE   Ketones, ur NEGATIVE NEGATIVE mg/dL   Protein, ur NEGATIVE NEGATIVE mg/dL   Nitrite NEGATIVE NEGATIVE   Leukocytes, UA NEGATIVE NEGATIVE    Comment: Performed at [facility] [address] [place], [place] [address]   Ct Abdomen Pelvis W Contrast  Result Date: 06/29/2018 CLINICAL DATA:  72 year old female with generalized abdominal pain. EXAM: CT ABDOMEN AND PELVIS WITH CONTRAST TECHNIQUE: Multidetector CT imaging of the abdomen and pelvis was performed using the standard protocol following bolus administration of intravenous contrast. CONTRAST:  127m OMNIPAQUE IOHEXOL 300 MG/ML  SOLN COMPARISON:  CT of the abdomen pelvis dated 07/15/2017 FINDINGS: Lower chest: The visualized lung bases are clear. No intra-abdominal free air or free fluid. Hepatobiliary: No focal liver abnormality is seen. No gallstones, gallbladder wall thickening, or biliary dilatation. Pancreas: Unremarkable. No pancreatic ductal dilatation or surrounding inflammatory changes. Spleen: Normal in size without focal abnormality. Adrenals/Urinary Tract: The adrenal glands are unremarkable. There is no hydronephrosis on either side. There is symmetric enhancement and excretion contrast by both kidneys. The visualized ureters and urinary bladder appear unremarkable. Stomach/Bowel: There is moderate stool throughout the colon. There are small scattered colonic diverticula without active inflammatory changes. There is focal protrusion of a short segment of small bowel through the umbilical hernia which is new compared to the prior CT. There is mild stranding of the fat in the umbilical hernia. Findings concerning for a degree of strangulation or incarceration. There is fecalized normal caliber loops of small bowel in the mid abdomen which may represent increased transit time or small intestinal bacterial overgrowth. There is no evidence of bowel obstruction at this time. Normal appendix. Vascular/Lymphatic: Mild aortoiliac  atherosclerotic disease. No portal venous gas. There is no adenopathy. Reproductive: Slightly heterogeneous uterus with small fibroids. The ovaries are grossly unremarkable as visualized. Other: Mild diffuse subcutaneous edema. Musculoskeletal: Osteopenia with degenerative changes of the spine. There is compression of the superior endplate of the L1 vertebra with approximately 30-40% loss of vertebral body height. This is age indeterminate but new since the prior CT. No retropulsed fragment. Correlation with clinical exam and point tenderness recommended. IMPRESSION: 1. Interval protrusion of a short segment  of small bowel into the umbilical hernia with apparent mild associated inflammatory changes, likely a degree of strangulation or incarceration. Clinical correlation is recommended. No bowel obstruction. No fluid collection. 2. Six scattered colonic diverticula without active inflammatory changes. Normal appendix. 3. Age indeterminate L1 compression fracture, new from prior CT. Correlation with clinical exam and point tenderness recommended. Electronically Signed   By: Anner Crete M.D.   On: 06/29/2018 04:45    Review of Systems  Unable to perform ROS: Dementia  Respiratory: Negative for shortness of breath.   Cardiovascular: Negative for chest pain.  Gastrointestinal: Positive for abdominal pain, nausea and vomiting.    Blood pressure 138/72, pulse 93, temperature 98.7 F (37.1 C), temperature source Oral, resp. rate (!) 9, SpO2 99 %. Physical Exam  Constitutional: No distress.  Morbidly obese, in no acute distress  HENT:  Head: Normocephalic and atraumatic.  Right Ear: External ear normal.  Left Ear: External ear normal.  Nose: Nose normal.  Mouth/Throat: Oropharynx is clear and moist.  Eyes: Pupils are equal, round, and reactive to light. Right eye exhibits no discharge. Left eye exhibits no discharge. No scleral icterus.  Neck: Normal range of motion. Neck supple. No tracheal  deviation present.  Cardiovascular: Normal rate, regular rhythm, normal heart sounds and intact distal pulses.  No murmur heard. Respiratory: Effort normal and breath sounds normal. No respiratory distress. She has no wheezes.  GI: Soft.  Morbidly obese.  There is an incarcerated hernia which is mildly tender above the umbilicus.  I cannot reduce it.  The rest of her abdomen is soft and nontender  Musculoskeletal: Normal range of motion. She exhibits no edema or deformity.  Lymphadenopathy:    She has no cervical adenopathy.  Neurological: She is alert.  She is awake and alert and can answer some questions but has dementia  Skin: Skin is warm and dry. No rash noted. No erythema.  Psychiatric: Her behavior is normal.     Assessment/Plan Incarcerated umbilical hernia  I have discussed this with the patient.  I have also discussed this with the patient's sister Vaughan Basta by phone.  The CT scan does show this is an incarcerated hernia containing small bowel.  There are some inflammatory changes in the fat around it.  Repair of the hernia is recommended urgently.  I discussed the risks with the patient's sister.  These include but are not limited to bleeding, infection, injury to surrounding structures, the need for a bowel resection, the use of mesh, cardiopulmonary issues, etc.  She understands and agrees that we should proceed with surgery on her sister this morning.  This will be performed by Dr. Brantley Stage this morning who is the acute care surgeon on call. Harl Bowie, MD 06/29/2018, 5:56 AM

## 2018-06-30 ENCOUNTER — Encounter (HOSPITAL_COMMUNITY): Payer: Self-pay | Admitting: Surgery

## 2018-06-30 LAB — COMPREHENSIVE METABOLIC PANEL
ALT: 12 U/L (ref 0–44)
AST: 13 U/L — ABNORMAL LOW (ref 15–41)
Albumin: 3.1 g/dL — ABNORMAL LOW (ref 3.5–5.0)
Alkaline Phosphatase: 66 U/L (ref 38–126)
Anion gap: 4 — ABNORMAL LOW (ref 5–15)
BUN: 14 mg/dL (ref 8–23)
CO2: 25 mmol/L (ref 22–32)
Calcium: 10.2 mg/dL (ref 8.9–10.3)
Chloride: 108 mmol/L (ref 98–111)
Creatinine, Ser: 0.76 mg/dL (ref 0.44–1.00)
GFR calc Af Amer: >60 mL/min (ref >60)
GFR calc non Af Amer: >60 mL/min (ref >60)
Glucose, Bld: 132 mg/dL — ABNORMAL HIGH (ref 70–99)
Potassium: 4.3 mmol/L (ref 3.5–5.1)
Sodium: 137 mmol/L (ref 135–145)
Total Bilirubin: 0.4 mg/dL (ref 0.3–1.2)
Total Protein: 7 g/dL (ref 6.5–8.1)

## 2018-06-30 LAB — CBC
HCT: 32.6 % — ABNORMAL LOW (ref 36.0–46.0)
Hemoglobin: 10.1 g/dL — ABNORMAL LOW (ref 12.0–15.0)
MCH: 26.3 pg (ref 26.0–34.0)
MCHC: 31 g/dL (ref 30.0–36.0)
MCV: 84.9 fL (ref 80.0–100.0)
Platelets: 229 10*3/uL (ref 150–400)
RBC: 3.84 MIL/uL — ABNORMAL LOW (ref 3.87–5.11)
RDW: 12.9 % (ref 11.5–15.5)
WBC: 8.1 10*3/uL (ref 4.0–10.5)
nRBC: 0 % (ref 0.0–0.2)

## 2018-06-30 LAB — GLUCOSE, CAPILLARY
Glucose-Capillary: 119 mg/dL — ABNORMAL HIGH (ref 70–99)
Glucose-Capillary: 167 mg/dL — ABNORMAL HIGH (ref 70–99)
Glucose-Capillary: 175 mg/dL — ABNORMAL HIGH (ref 70–99)
Glucose-Capillary: 216 mg/dL — ABNORMAL HIGH (ref 70–99)

## 2018-06-30 MED ORDER — POLYETHYLENE GLYCOL 3350 17 G PO PACK: 17.0000 g | PACK | Freq: Every day | ORAL | 0 refills | Status: AC | PRN

## 2018-06-30 MED ORDER — ACETAMINOPHEN 325 MG PO TABS: 650.0000 mg | ORAL_TABLET | Freq: Four times a day (QID) | ORAL | Status: DC

## 2018-06-30 MED ORDER — ACETAMINOPHEN 325 MG PO TABS
650.0000 mg | ORAL_TABLET | Freq: Four times a day (QID) | ORAL | Status: DC
  Administered 2018-06-30 (×3): 650 mg via ORAL
  Filled 2018-06-30 (×3): qty 2

## 2018-06-30 MED ORDER — ACETAMINOPHEN 325 MG PO TABS: 650.0000 mg | ORAL_TABLET | Freq: Four times a day (QID) | ORAL | 0 refills | Status: AC

## 2018-06-30 MED ORDER — OXYCODONE HCL 5 MG PO TABS: 10.0000 mg | ORAL_TABLET | Freq: Four times a day (QID) | ORAL | 0 refills | Status: DC | PRN

## 2018-06-30 NOTE — Social Work (Signed)
CSW spoke with [facility]- confirmed pt is a resident. Pt summary will need to say memory care as that is the level of care she resides in and is returning to. FL2 needs co-signature as well. Will fax all information when appropriate to [phone].  Westley Hummer, MSW, [place] Work [phone]

## 2018-06-30 NOTE — Progress Notes (Signed)
Attempted to call report to [facility]. Per receptionist, the staff members are not answering the phone. Voicemail left on [phone] to call this RN back for report when they are available.

## 2018-06-30 NOTE — Social Work (Addendum)
3:15pm- RN to call report now to [phone]  Clinical Social Worker facilitated patient discharge including contacting patient family and facility to confirm patient discharge plans.  Clinical information faxed to facility and family agreeable with plan.  CSW arranged ambulance transport via [place] to [facility]. RN to call [phone]  with report prior to discharge.  Clinical Social Worker will sign off for now as social work intervention is no longer needed. Please consult [place] again if new need arises.  Alexander Mt, [place] Social Worker [phone]

## 2018-06-30 NOTE — Discharge Summary (Addendum)
[facility] Discharge Summary   Patient ID: Katelyn [name] MRN: [id number] DOB/AGE: 10-07-1945 72 y.o.  Admit date: 06/29/2018 Discharge date: 06/30/2018  Discharge Diagnosis Patient Active Problem List   Diagnosis Date Noted  . Ventral hernia 06/29/2018  . Dementia without behavioral disturbance ([place]) 06/29/2018  . Dehydration 07/16/2017  . Hypercalcemia 07/16/2017  . Diabetic gastroparesis associated with type 2 diabetes mellitus ([place]) 07/16/2017  . Asthma 07/15/2017  . Diabetes mellitus without complication ([place]) 66/59/9357  . GERD (gastroesophageal reflux disease) 07/15/2017  . Hyperparathyroidism ([place]) 07/15/2017  . Acute metabolic encephalopathy 01/77/9390  . Abdominal pain 07/15/2017  . Diabetic neuropathy ([place]) 05/07/2015  . Achilles tendinitis of left lower extremity 03/30/2014  . Spinal stenosis, lumbar region, with neurogenic claudication 02/16/2014  . Other chronic postoperative pain 02/16/2014  . Bilateral knee contractures 02/16/2014  . Obstructive sleep apnea 06/26/2013  . TRIGGER FINGER 04/29/2010  . Allergic rhinitis, cause unspecified 03/24/2010  . OSTEOARTHRITIS 02/10/2010  . DENTAL CARIES 09/24/2009  . DIABETIC  RETINOPATHY 04/29/2009  . ONYCHOMYCOSIS, TOENAILS 04/08/2009  . DYSLIPIDEMIA 04/08/2009  . EDEMA 04/08/2009  . Essential hypertension 12/24/2008  . ELECTROCARDIOGRAM, ABNORMAL 12/17/2008  . SCIATICA, RIGHT 07/11/2007  . Type II or unspecified type diabetes mellitus without mention of complication, not stated as uncontrolled 04/11/2007  . ANEMIA DUE TO DIETARY IRON DEFICIENCY 04/11/2007  . OSTEOARTHRITIS, GENERALIZED 04/11/2007    Consultants Family Medicine - Dr. Karmen Bongo   Imaging: Ct Abdomen Pelvis W Contrast  Result Date: 06/29/2018 CLINICAL DATA:  72 year old female with generalized abdominal pain. EXAM: CT ABDOMEN AND PELVIS WITH CONTRAST TECHNIQUE: Multidetector CT imaging of the abdomen and pelvis was  performed using the standard protocol following bolus administration of intravenous contrast. CONTRAST:  12mL OMNIPAQUE IOHEXOL 300 MG/ML  SOLN COMPARISON:  CT of the abdomen pelvis dated 07/15/2017 FINDINGS: Lower chest: The visualized lung bases are clear. No intra-abdominal free air or free fluid. Hepatobiliary: No focal liver abnormality is seen. No gallstones, gallbladder wall thickening, or biliary dilatation. Pancreas: Unremarkable. No pancreatic ductal dilatation or surrounding inflammatory changes. Spleen: Normal in size without focal abnormality. Adrenals/Urinary Tract: The adrenal glands are unremarkable. There is no hydronephrosis on either side. There is symmetric enhancement and excretion contrast by both kidneys. The visualized ureters and urinary bladder appear unremarkable. Stomach/Bowel: There is moderate stool throughout the colon. There are small scattered colonic diverticula without active inflammatory changes. There is focal protrusion of a short segment of small bowel through the umbilical hernia which is new compared to the prior CT. There is mild stranding of the fat in the umbilical hernia. Findings concerning for a degree of strangulation or incarceration. There is fecalized normal caliber loops of small bowel in the mid abdomen which may represent increased transit time or small intestinal bacterial overgrowth. There is no evidence of bowel obstruction at this time. Normal appendix. Vascular/Lymphatic: Mild aortoiliac atherosclerotic disease. No portal venous gas. There is no adenopathy. Reproductive: Slightly heterogeneous uterus with small fibroids. The ovaries are grossly unremarkable as visualized. Other: Mild diffuse subcutaneous edema. Musculoskeletal: Osteopenia with degenerative changes of the spine. There is compression of the superior endplate of the L1 vertebra with approximately 30-40% loss of vertebral body height. This is age indeterminate but new since the prior CT. No  retropulsed fragment. Correlation with clinical exam and point tenderness recommended. IMPRESSION: 1. Interval protrusion of a short segment of small bowel into the umbilical hernia with apparent mild associated inflammatory changes, likely a degree of strangulation or  incarceration. Clinical correlation is recommended. No bowel obstruction. No fluid collection. 2. Six scattered colonic diverticula without active inflammatory changes. Normal appendix. 3. Age indeterminate L1 compression fracture, new from prior CT. Correlation with clinical exam and point tenderness recommended. Electronically Signed   By: Anner Crete M.D.   On: 06/29/2018 04:45    Procedures Dr. Marcello Moores Cornett (06/29/18) - ventral hernia repair with mesh   Hospital Course:  72 y/o female with a PMH dementia, HTN, DM, and OSA who presented to [facility] from an assisted living facility where she resides with a cc abdominal pain associated with nausea and vomiting. Workup revealed an incarcerated umbilical hernia containing small bowel. She was admitted to the hospital and underwent the procedure above without complication. Family medicine was consulted for assistance with management of the patients above medical conditions. Post-operatively the patients diet was advanced as tolerated. On 06/30/18 Ms. La bossiere's vitals were stable, mobilizing with a walker, tolerating PO, urinating without issues and medically stable for discharge back to assisted living. She will require outpatient follow up with Dr. Brantley Stage, our office is scheduling her for this appointment. Discharge instructions attached. Paper script provided for oxycodone.   Physical Exam: General:  Alert, NAD, pleasant, comfortable Abd:  Soft, appropriately tender, incisions c/d/i closed with absorbable sutures and skin glue, +BS   Allergies as of 06/30/2018      Reactions   Aspirin Other (See Comments)   Stomach irritation   Hydrochlorothiazide    REACTION: 2 trials of  HCTZ with pt intolerance secondary to dizzines   Ibuprofen Other (See Comments)    Causes "shakiness"   Latex Itching   Irritates, Redness   Tramadol    Stomach upset      Medication List    TAKE these medications   acetaminophen 325 MG tablet Commonly known as:  TYLENOL Take 2 tablets (650 mg total) by mouth every 6 (six) hours for 7 days. What changed:    medication strength  how much to take  when to take this  reasons to take this   albuterol 108 (90 Base) MCG/ACT inhaler Commonly known as:  PROVENTIL HFA;VENTOLIN HFA Inhale 2 puffs into the lungs 4 (four) times daily.   amLODipine 10 MG tablet Commonly known as:  NORVASC Take 10 mg by mouth daily.   aspirin EC 81 MG tablet Take 81 mg by mouth daily.   b complex vitamins capsule Take 1 capsule by mouth daily.   [employer] D3 5000 units capsule Generic drug:  Cholecalciferol Take 5,000 Units by mouth daily.   diclofenac sodium 1 % Gel Commonly known as:  VOLTAREN Apply 1 application topically 4 (four) times daily as needed (back pain).   esomeprazole 40 MG capsule Commonly known as:  NEXIUM Take 40 mg by mouth daily.   famotidine 20 MG tablet Commonly known as:  PEPCID Take 20 mg by mouth 2 (two) times daily.   guaifenesin 100 MG/5ML syrup Commonly known as:  ROBITUSSIN Take 200 mg by mouth every 6 (six) hours as needed for cough.   hydrochlorothiazide 25 MG tablet Commonly known as:  HYDRODIURIL Take 25 mg by mouth daily.   loperamide 2 MG capsule Commonly known as:  IMODIUM Take 2 mg by mouth as needed for diarrhea or loose stools.   magnesium hydroxide 400 MG/5ML suspension Commonly known as:  MILK OF MAGNESIA Take 30 mLs by mouth at bedtime as needed for mild constipation.   metFORMIN 1000 MG tablet Commonly known as:  GLUCOPHAGE  Take 500 mg by mouth 2 (two) times daily with a meal.   MINTOX [phone] MG/5ML suspension Generic drug:  alum & mag hydroxide-simeth Take 30 mLs by mouth every 6  (six) hours as needed for indigestion or heartburn.   mometasone 50 MCG/ACT nasal spray Commonly known as:  NASONEX Place 2 sprays into the nose daily.   neomycin-bacitracin-polymyxin [phone] ointment Apply 1 application topically 4 (four) times daily as needed (minor skin tears/abrasions).   oxyCODONE 5 MG immediate release tablet Commonly known as:  Oxy IR/ROXICODONE Take 2 tablets (10 mg total) by mouth every 6 (six) hours as needed for moderate pain.   polyethylene glycol packet Commonly known as:  MIRALAX / GLYCOLAX Take 17 g by mouth daily as needed for mild constipation.   potassium chloride 10 MEQ tablet Commonly known as:  K-DUR Take 1 tablet (10 mEq total) daily by mouth.   rosuvastatin 5 MG tablet Commonly known as:  CRESTOR Take 5 mg by mouth at bedtime.          Signed: Obie Dredge, [facility] 06/30/2018, 8:45 AM

## 2018-06-30 NOTE — Progress Notes (Signed)
Called [facility] with the number on file, left a vm to call

## 2018-06-30 NOTE — NC FL2 (Addendum)
[place] MEDICAID FL2 LEVEL OF CARE SCREENING TOOL     IDENTIFICATION  Patient Name: Katelyn [name] bossiere Birthdate: Sep 18, 1945 Sex: female Admission Date (Current Location): 06/29/2018  [facility] and [place] Number:  Herbalist and Address:  [facility] [place] [address], [place], [place] [address]      Provider Number: [id number]  Attending Physician Name and Address:  Edison Pace, Md, MD  Relative Name and Phone Number:       Current Level of Care: Hospital Recommended Level of Care: Memory Care Prior Approval Number:    Date Approved/Denied:   PASRR Number:    Discharge Plan: Other (Comment)([facility])    Current Diagnoses: Patient Active Problem List   Diagnosis Date Noted  . Ventral hernia 06/29/2018  . Dementia without behavioral disturbance ([place]) 06/29/2018  . Dehydration 07/16/2017  . Hypercalcemia 07/16/2017  . Diabetic gastroparesis associated with type 2 diabetes mellitus ([place]) 07/16/2017  . Asthma 07/15/2017  . Diabetes mellitus without complication ([place]) 93/79/0240  . GERD (gastroesophageal reflux disease) 07/15/2017  . Hyperparathyroidism ([place]) 07/15/2017  . Acute metabolic encephalopathy 97/35/3299  . Abdominal pain 07/15/2017  . Diabetic neuropathy ([place]) 05/07/2015  . Achilles tendinitis of left lower extremity 03/30/2014  . Spinal stenosis, lumbar region, with neurogenic claudication 02/16/2014  . Other chronic postoperative pain 02/16/2014  . Bilateral knee contractures 02/16/2014  . Obstructive sleep apnea 06/26/2013  . TRIGGER FINGER 04/29/2010  . Allergic rhinitis, cause unspecified 03/24/2010  . OSTEOARTHRITIS 02/10/2010  . DENTAL CARIES 09/24/2009  . DIABETIC  RETINOPATHY 04/29/2009  . ONYCHOMYCOSIS, TOENAILS 04/08/2009  . DYSLIPIDEMIA 04/08/2009  . EDEMA 04/08/2009  . Essential hypertension 12/24/2008  . ELECTROCARDIOGRAM, ABNORMAL 12/17/2008  . SCIATICA, RIGHT 07/11/2007  . Type II or  unspecified type diabetes mellitus without mention of complication, not stated as uncontrolled 04/11/2007  . ANEMIA DUE TO DIETARY IRON DEFICIENCY 04/11/2007  . OSTEOARTHRITIS, GENERALIZED 04/11/2007    Orientation RESPIRATION BLADDER Height & Weight     Self, Time, Situation, Place  Normal Continent Weight: 207 lb 0.2 oz (93.9 kg) Height:  5\' 5"  (165.1 cm)  BEHAVIORAL SYMPTOMS/MOOD NEUROLOGICAL BOWEL NUTRITION STATUS      Continent Diet (regular diet- thin liquids)  AMBULATORY STATUS COMMUNICATION OF NEEDS Skin   Limited Assist Verbally Surgical wounds(abdominal incision with liquid skin adhesive)                       Personal Care Assistance Level of Assistance  Bathing, Feeding, Dressing Bathing Assistance: Limited assistance Feeding assistance: Independent Dressing Assistance: Limited assistance     Functional Limitations Info  Sight, Hearing, Speech Sight Info: Adequate Hearing Info: Adequate Speech Info: Adequate    SPECIAL CARE FACTORS FREQUENCY                       Contractures Contractures Info: Not present    Additional Factors Info  Code Status, Allergies, Insulin Sliding Scale Code Status Info: Full Code Allergies Info: ASPIRIN, HYDROCHLOROTHIAZIDE, IBUPROFEN, LATEX, TRAMADOL    Insulin Sliding Scale Info: insulin aspart (novoLOG) injection 0-15 Units 3x daily with meals; insulin aspart (novoLOG) injection 0-5 Units        Current Medications (06/30/2018):  This is the current hospital active medication list Current Facility-Administered Medications  Medication Dose Route Frequency Provider Last Rate Last Dose  . 0.9 % NaCl with KCl 20 mEq/ L  infusion   Intravenous Continuous Erroll Luna, MD 75  mL/hr at 06/30/18 0346    . acetaminophen (TYLENOL) tablet 650 mg  650 mg Oral Q6H Jill Alexanders, PA-C   650 mg at 06/30/18 0854  . albuterol (PROVENTIL) (2.5 MG/3ML) 0.083% nebulizer solution 3 mL  3 mL Inhalation Daily PRN Cornett, Thomas,  MD      . amLODipine (NORVASC) tablet 5 mg  5 mg Oral Daily Cornett, Thomas, MD   5 mg at 06/29/18 1504  . aspirin EC tablet 81 mg  81 mg Oral Daily Cornett, Thomas, MD   81 mg at 06/29/18 1504  . diphenhydrAMINE (BENADRYL) 12.5 MG/5ML elixir 12.5 mg  12.5 mg Oral Q6H PRN Cornett, Thomas, MD       Or  . diphenhydrAMINE (BENADRYL) injection 12.5 mg  12.5 mg Intravenous Q6H PRN Cornett, Thomas, MD      . enoxaparin (LOVENOX) injection 40 mg  40 mg Subcutaneous Q24H Cornett, Thomas, MD   40 mg at 06/30/18 0855  . famotidine (PEPCID) tablet 20 mg  20 mg Oral BID Erroll Luna, MD   20 mg at 06/29/18 2040  . fentaNYL (SUBLIMAZE) injection 25 mcg  25 mcg Intravenous Q1H PRN Cornett, Thomas, MD      . hydrALAZINE (APRESOLINE) injection 10 mg  10 mg Intravenous Q2H PRN Cornett, Thomas, MD      . insulin aspart (novoLOG) injection 0-15 Units  0-15 Units Subcutaneous TID WC Karmen Bongo, MD   3 Units at 06/29/18 1836  . insulin aspart (novoLOG) injection 0-5 Units  0-5 Units Subcutaneous QHS Karmen Bongo, MD      . menthol-cetylpyridinium (CEPACOL) lozenge 3 mg  1 lozenge Oral PRN Cornett, Thomas, MD      . methocarbamol (ROBAXIN) tablet 500 mg  500 mg Oral Q6H PRN Erroll Luna, MD   500 mg at 06/30/18 0349  . metoCLOPramide (REGLAN) tablet 5 mg  5 mg Oral TID AC Cornett, Marcello Moores, MD   5 mg at 06/30/18 0855  . ondansetron (ZOFRAN-ODT) disintegrating tablet 4 mg  4 mg Oral Q6H PRN Cornett, Thomas, MD       Or  . ondansetron (ZOFRAN) injection 4 mg  4 mg Intravenous Q6H PRN Cornett, Thomas, MD      . oxyCODONE (Oxy IR/ROXICODONE) immediate release tablet 5-10 mg  5-10 mg Oral Q4H PRN Erroll Luna, MD   10 mg at 06/30/18 0349  . pantoprazole (PROTONIX) EC tablet 40 mg  40 mg Oral Daily Karmen Bongo, MD      . pneumococcal 23 valent vaccine (PNU-IMMUNE) injection 0.5 mL  0.5 mL Intramuscular Tomorrow-1000 Coralie Keens, MD      . polyethylene glycol (MIRALAX / GLYCOLAX) packet 17 g  17 g  Oral Daily PRN Cornett, Thomas, MD      . rosuvastatin (CRESTOR) tablet 5 mg  5 mg Oral Ivery Quale, MD   5 mg at 06/29/18 2040  . simethicone (MYLICON) chewable tablet 40 mg  40 mg Oral Q6H PRN Erroll Luna, MD         Discharge Medications: Please see discharge summary for a list of discharge medications.  Relevant Imaging Results:  Relevant Lab Results:   Additional Information SS# [place], [place]

## 2018-06-30 NOTE — Clinical Social Work Placement (Signed)
   CLINICAL SOCIAL WORK PLACEMENT  NOTE Stephan Minister RN to call report to [phone]  Date:  06/30/2018  Patient Details  Name: Katelyn [name] MRN: [id number] Date of Birth: [birth year]  Clinical Social Work is seeking post-discharge placement for this patient at the [place] level of care (*CSW will initial, date and re-position this form in  chart as items are completed):  No   Patient/family provided with [place] Work Department's list of facilities offering this level of care within the geographic area requested by the patient (or if unable, by the patient's family).  Yes   Patient/family informed of their freedom to choose among providers that offer the needed level of care, that participate in Medicare, Medicaid or managed care program needed by the patient, have an available bed and are willing to accept the patient.  Yes   Patient/family informed of [facility]'s ownership interest in [facility] and [facility], as well as of the fact that they are under no obligation to receive care at these facilities.  PASRR submitted to EDS on       PASRR number received on       Existing PASRR number confirmed on       FL2 transmitted to all facilities in geographic area requested by pt/family on 06/30/18     FL2 transmitted to all facilities within larger geographic area on       Patient informed that his/her managed care company has contracts with or will negotiate with certain facilities, including the following:        Yes   Patient/family informed of bed offers received.  Patient [facility] bed at [facility]     Physician recommends and patient chooses bed at      Patient to be transferred to [facility] on 06/30/18.  Patient to be transferred to facility by [facility]     Patient family notified on 06/30/18 of transfer.  Name of family member notified:  pt sister [facility]     PHYSICIAN       Additional Comment:     _______________________________________________ Alexander [place], [place] 06/30/2018, 4:29 PM

## 2018-06-30 NOTE — Progress Notes (Signed)
Attempted to call number listed as [name] no answer. Unable to leave a message on the voicemail.

## 2018-06-30 NOTE — Consult Note (Addendum)
            [facility] CM Primary Care Navigator  06/30/2018  Katelyn [name] bossiere April 26, 1946 [id number]   Went to see patient at the bedside to identify possible discharge needs but she is not able to fully give appropriate responses to questions, related to history of dementia. No family member noted in the room. Call placed to sister Katelyn [name]) but was unsuccessful to reach her. Was able to speak to daughter (Katelyn [name]) briefly but seemed not aware of what is going on with her mother.  PerMD note, patient presented from a nursing facility where she resides, with an abdominal pain associated with nausea and vomiting. Workup showed incarcerated umbilical hernia containing small bowel and underwent ventral hernia repair with mesh.  According to Inpatient social worker note, patient was confirmed to be a resident of [facility] memory care facility (level of care she resides in) and will return back to it at discharge.  Notedno furtherhealth management needs identifiable atthis point.  Primary care provider listed is Dr. Iona Beard Osei-Bonsu with [facility].  Patient has instruction to follow-up with general surgery post discharge (office is scheduling post-op follow-up appointment for patient).    For additional questions please contact:  Edwena Felty A. Khaiden Segreto, BSN, RN-BC [facility] PRIMARY CARE Navigator Cell: [phone]

## 2018-06-30 NOTE — Discharge Instructions (Signed)
TAKE TYLENOL AS PRESCRIBED FOR PAIN FIRST. THEN TAKE YOUR PRESCRIBED PAIN MEDICATION AS NEEDED.  [facility], [place] [phone]  OPEN ABDOMINAL SURGERY: POST OP INSTRUCTIONS  Always review your discharge instruction sheet given to you by the facility where your surgery was performed.  IF YOU HAVE DISABILITY OR FAMILY LEAVE FORMS, YOU MUST BRING THEM TO THE OFFICE FOR PROCESSING.  PLEASE DO NOT GIVE THEM TO YOUR DOCTOR.  1. A prescription for pain medication may be given to you upon discharge.  Take your pain medication as prescribed, if needed.  If narcotic pain medicine is not needed, then you may take acetaminophen (Tylenol) or ibuprofen (Advil) as needed. 2. Take your usually prescribed medications unless otherwise directed. 3. If you need a refill on your pain medication, please contact your pharmacy. They will contact our office to request authorization.  Prescriptions will not be filled after 5pm or on week-ends. 4. You should follow a light diet the first few days after arrival home, such as soup and crackers, pudding, etc.unless your doctor has advised otherwise. A high-fiber, low fat diet can be resumed as tolerated.   Be sure to include lots of fluids daily. Most patients will experience some swelling and bruising on the chest and neck area.  Ice packs will help.  Swelling and bruising can take several days to resolve 5. Most patients will experience some swelling and bruising in the area of the incision. Ice pack will help. Swelling and bruising can take several days to resolve..  6. It is common to experience some constipation if taking pain medication after surgery.  Increasing fluid intake and taking a stool softener will usually help or prevent this problem from occurring.  A mild laxative (Milk of Magnesia or Miralax) should be taken according to package directions if there are no bowel movements after 48 hours. 7.  You may have steri-strips (small skin tapes) in  place directly over the incision.  These strips should be left on the skin for 7-10 days.  If your surgeon used skin glue on the incision, you may shower in 24 hours.  The glue will flake off over the next 2-3 weeks.  Any sutures or staples will be removed at the office during your follow-up visit. You may find that a light gauze bandage over your incision may keep your staples from being rubbed or pulled. You may shower and replace the bandage daily. 8. ACTIVITIES:  You may resume regular (light) daily activities beginning the next day--such as daily self-care, walking, climbing stairs--gradually increasing activities as tolerated.  You may have sexual intercourse when it is comfortable.  Refrain from any heavy lifting or straining until approved by your doctor. a. You may drive when you no longer are taking prescription pain medication, you can comfortably wear a seatbelt, and you can safely maneuver your car and apply brakes b. Return to Work: ___________________________________ 53. You should see your doctor in the office for a follow-up appointment approximately two weeks after your surgery.  Make sure that you call for this appointment within a day or two after you arrive home to insure a convenient appointment time. OTHER INSTRUCTIONS:  _____________________________________________________________ _____________________________________________________________  WHEN TO CALL YOUR DOCTOR: 1. Fever over 101.0 2. Inability to urinate 3. Nausea and/or vomiting 4. Extreme swelling or bruising 5. Continued bleeding from incision. 6. Increased pain, redness, or drainage from the incision. 7. Difficulty swallowing or breathing 8. Muscle cramping or spasms. 9. Numbness  or tingling in hands or feet or around lips.  The clinic staff is available to answer your questions during regular business hours.  Please dont hesitate to call and ask to speak to one of the nurses if you have concerns.  For further  questions, please visit [email or web address]

## 2018-06-30 NOTE — Progress Notes (Signed)
The patient is discharging to another healthcare facility for continuation of treatment.  The patient's medical information, risk factors, are a part of the medical information shared with the receiving healthcare facility. Report called and received by Lockie Mola at [facility].   Earnie Larsson 06/30/2018, 22:15

## 2018-06-30 NOTE — Social Work (Addendum)
UPDATED REPORT- Clinical Social Worker facilitated patient discharge including contacting patient family and facility to confirm patient discharge plans.  Clinical information faxed to facility and family agreeable with plan.  CSW arranged ambulance transport via [facility] to [facility] RN to call [phone]  with report prior to discharge.  Clinical Social Worker will sign off for now as social work intervention is no longer needed. Please consult [place] again if new need arises.  Katelyn [name], [place] Social Worker [phone]  Katelyn [name] (sister) [phone] cell phone.

## 2018-06-30 NOTE — Clinical Social Work Note (Signed)
Clinical Social Work Assessment  Patient Details  Name: Katelyn [name] MRN: [id number] Date of Birth: 06-30-1946  Date of referral:  06/30/18               Reason for consult:  Facility Placement, Discharge Planning                Permission sought to share information with:  Family Supports, Customer service manager Permission granted to share information::  Yes, Verbal Permission Granted  Name::      [name]; April Love  Agency::   [facility]  Relationship::    sister; daughter  Sport and exercise psychologist Information:   ; [phone]  Housing/Transportation Living arrangements for the past 2 months:  Single Family Home Source of Information:  Patient Patient Interpreter Needed:  None Criminal Activity/Legal Involvement Pertinent to Current Situation/Hospitalization:  No - Comment as needed Significant Relationships:  Adult Children, Siblings Lives with:  Facility Resident Do you feel safe going back to the place where you live?  Yes Need for family participation in patient care:  Yes (Comment)  Care giving concerns:  Pt from [facility], has sister that supports her.   Social Worker assessment / plan:  Pt met with pt at bedside, pt alert and despite memory impairment gives appropriate answers. Pt new resident at [facility], feels good with care there. understands discharge likely today is okay with that. Spoke with pt sister she is also understanding will meet pt at facility.   Employment status:  Retired Nurse, adult PT Recommendations:  Not assessed at this time Information / Referral to community resources:  Other (Comment Required)(Memory Care)  Patient/Family's Response to care:  Pt and pt family state understanding of CSW role, okay with pt return to ALF/Memory Care.  Patient/Family's Understanding of and Emotional Response to Diagnosis, Current Treatment, and Prognosis: Pt and pt sister state understanding of  diagnosis, current treatment and prognosis. Pt sister had similar procedure. Pt sister provides pt with emotional and physical support as able, states pt daughter is not involved in pt life. Pt and pt sister okay with return to Memory Care.  Emotional Assessment Appearance:  Appears stated age Attitude/Demeanor/Rapport:  Engaged, Gracious Affect (typically observed):  Accepting, Adaptable, Appropriate Orientation:  Oriented to Situation, Oriented to  Time, Oriented to Place, Oriented to Self, Fluctuating Orientation (Suspected and/or reported Sundowners) Alcohol / Substance use:  Not Applicable Psych involvement (Current and /or in the community):  No (Comment)  Discharge Needs  Concerns to be addressed:  Care Coordination Readmission within the last 30 days:  No Current discharge risk:  Physical Impairment, Cognitively Impaired Barriers to Discharge:  Continued Medical Work up   [employer], [place] 06/30/2018, 10:20 AM

## 2018-07-04 DIAGNOSIS — I10 Essential (primary) hypertension: Secondary | ICD-10-CM | POA: Diagnosis not present

## 2018-07-04 DIAGNOSIS — F0391 Unspecified dementia with behavioral disturbance: Secondary | ICD-10-CM | POA: Diagnosis not present

## 2018-07-04 DIAGNOSIS — Z7689 Persons encountering health services in other specified circumstances: Secondary | ICD-10-CM | POA: Diagnosis not present

## 2018-07-04 DIAGNOSIS — E119 Type 2 diabetes mellitus without complications: Secondary | ICD-10-CM | POA: Diagnosis not present

## 2018-07-04 DIAGNOSIS — G4733 Obstructive sleep apnea (adult) (pediatric): Secondary | ICD-10-CM | POA: Diagnosis not present

## 2018-07-04 DIAGNOSIS — Z9889 Other specified postprocedural states: Secondary | ICD-10-CM | POA: Diagnosis not present

## 2018-07-08 DIAGNOSIS — R278 Other lack of coordination: Secondary | ICD-10-CM | POA: Diagnosis not present

## 2018-07-08 DIAGNOSIS — R2681 Unsteadiness on feet: Secondary | ICD-10-CM | POA: Diagnosis not present

## 2018-07-08 DIAGNOSIS — M6281 Muscle weakness (generalized): Secondary | ICD-10-CM | POA: Diagnosis not present

## 2018-07-11 DIAGNOSIS — F0390 Unspecified dementia without behavioral disturbance: Secondary | ICD-10-CM | POA: Diagnosis not present

## 2018-07-11 DIAGNOSIS — G894 Chronic pain syndrome: Secondary | ICD-10-CM | POA: Diagnosis not present

## 2018-07-11 DIAGNOSIS — E119 Type 2 diabetes mellitus without complications: Secondary | ICD-10-CM | POA: Diagnosis not present

## 2018-07-11 DIAGNOSIS — I10 Essential (primary) hypertension: Secondary | ICD-10-CM | POA: Diagnosis not present

## 2018-07-11 DIAGNOSIS — G4733 Obstructive sleep apnea (adult) (pediatric): Secondary | ICD-10-CM | POA: Diagnosis not present

## 2018-07-21 DIAGNOSIS — E119 Type 2 diabetes mellitus without complications: Secondary | ICD-10-CM | POA: Diagnosis not present

## 2018-07-21 DIAGNOSIS — Z79899 Other long term (current) drug therapy: Secondary | ICD-10-CM | POA: Diagnosis not present

## 2018-07-21 DIAGNOSIS — D649 Anemia, unspecified: Secondary | ICD-10-CM | POA: Diagnosis not present

## 2018-07-21 DIAGNOSIS — E039 Hypothyroidism, unspecified: Secondary | ICD-10-CM | POA: Diagnosis not present

## 2018-07-21 DIAGNOSIS — E785 Hyperlipidemia, unspecified: Secondary | ICD-10-CM | POA: Diagnosis not present

## 2018-08-01 DIAGNOSIS — R278 Other lack of coordination: Secondary | ICD-10-CM | POA: Diagnosis not present

## 2018-08-01 DIAGNOSIS — R2681 Unsteadiness on feet: Secondary | ICD-10-CM | POA: Diagnosis not present

## 2018-08-01 DIAGNOSIS — M6281 Muscle weakness (generalized): Secondary | ICD-10-CM | POA: Diagnosis not present

## 2018-08-29 DIAGNOSIS — F0390 Unspecified dementia without behavioral disturbance: Secondary | ICD-10-CM | POA: Diagnosis not present

## 2018-08-29 DIAGNOSIS — N644 Mastodynia: Secondary | ICD-10-CM | POA: Diagnosis not present

## 2018-08-29 DIAGNOSIS — G4733 Obstructive sleep apnea (adult) (pediatric): Secondary | ICD-10-CM | POA: Diagnosis not present

## 2018-08-29 DIAGNOSIS — E119 Type 2 diabetes mellitus without complications: Secondary | ICD-10-CM | POA: Diagnosis not present

## 2018-08-29 DIAGNOSIS — I10 Essential (primary) hypertension: Secondary | ICD-10-CM | POA: Diagnosis not present

## 2018-08-31 DIAGNOSIS — R2681 Unsteadiness on feet: Secondary | ICD-10-CM | POA: Diagnosis not present

## 2018-08-31 DIAGNOSIS — M6281 Muscle weakness (generalized): Secondary | ICD-10-CM | POA: Diagnosis not present

## 2018-08-31 DIAGNOSIS — R278 Other lack of coordination: Secondary | ICD-10-CM | POA: Diagnosis not present

## 2018-09-07 ENCOUNTER — Other Ambulatory Visit: Payer: Self-pay | Admitting: Physician Assistant

## 2018-09-07 ENCOUNTER — Emergency Department (HOSPITAL_COMMUNITY)
Admission: EM | Admit: 2018-09-07 | Discharge: 2018-09-07 | Disposition: A | Discharge status: Home / Self Care | Payer: PPO | Attending: Emergency Medicine | Admitting: Emergency Medicine

## 2018-09-07 ENCOUNTER — Encounter (HOSPITAL_COMMUNITY): Payer: Self-pay

## 2018-09-07 DIAGNOSIS — E119 Type 2 diabetes mellitus without complications: Secondary | ICD-10-CM | POA: Insufficient documentation

## 2018-09-07 DIAGNOSIS — Z7982 Long term (current) use of aspirin: Secondary | ICD-10-CM | POA: Insufficient documentation

## 2018-09-07 DIAGNOSIS — I10 Essential (primary) hypertension: Secondary | ICD-10-CM | POA: Insufficient documentation

## 2018-09-07 DIAGNOSIS — M5414 Radiculopathy, thoracic region: Secondary | ICD-10-CM

## 2018-09-07 DIAGNOSIS — R404 Transient alteration of awareness: Secondary | ICD-10-CM | POA: Diagnosis not present

## 2018-09-07 DIAGNOSIS — Z7984 Long term (current) use of oral hypoglycemic drugs: Secondary | ICD-10-CM | POA: Insufficient documentation

## 2018-09-07 DIAGNOSIS — Z1231 Encounter for screening mammogram for malignant neoplasm of breast: Secondary | ICD-10-CM

## 2018-09-07 DIAGNOSIS — R279 Unspecified lack of coordination: Secondary | ICD-10-CM | POA: Diagnosis not present

## 2018-09-07 DIAGNOSIS — R52 Pain, unspecified: Secondary | ICD-10-CM | POA: Diagnosis not present

## 2018-09-07 DIAGNOSIS — M5431 Sciatica, right side: Secondary | ICD-10-CM | POA: Diagnosis not present

## 2018-09-07 DIAGNOSIS — Z91040 Latex allergy status: Secondary | ICD-10-CM | POA: Diagnosis not present

## 2018-09-07 DIAGNOSIS — Z79899 Other long term (current) drug therapy: Secondary | ICD-10-CM | POA: Insufficient documentation

## 2018-09-07 DIAGNOSIS — M5489 Other dorsalgia: Secondary | ICD-10-CM | POA: Diagnosis not present

## 2018-09-07 DIAGNOSIS — R1031 Right lower quadrant pain: Secondary | ICD-10-CM | POA: Diagnosis present

## 2018-09-07 DIAGNOSIS — J45909 Unspecified asthma, uncomplicated: Secondary | ICD-10-CM | POA: Diagnosis not present

## 2018-09-07 DIAGNOSIS — Z743 Need for continuous supervision: Secondary | ICD-10-CM | POA: Diagnosis not present

## 2018-09-07 MED ORDER — HYDROCODONE-ACETAMINOPHEN 5-325 MG PO TABS: 1.0000 | ORAL_TABLET | Freq: Four times a day (QID) | ORAL | 0 refills | Status: DC | PRN

## 2018-09-07 MED ORDER — HYDROCODONE-ACETAMINOPHEN 5-325 MG PO TABS
1.0000 | ORAL_TABLET | Freq: Once | ORAL | Status: AC
  Administered 2018-09-07: 1 via ORAL
  Filled 2018-09-07: qty 1

## 2018-09-07 NOTE — ED Provider Notes (Signed)
[facility] DEPT Provider Note: Georgena Spurling, MD, FACEP  CSN: [id number] MRN: [id number] ARRIVAL: 09/07/18 at [facility]   HISTORY OF PRESENT ILLNESS  09/07/18 4:12 AM Katelyn [name] is a 73 y.o. female with a history of right sided thoracic dermatomal pain.  This is occurred in episodes.  She is here with a return of the pain over the past day.  She rates her pain as a 10 out of 10.  It is worse with movement or palpation or with certain positions.  There is no associated rash.  She is also having pain in her right buttock which she has also had before.  She denies injury.  She denies shortness of breath or urinary symptoms.   Past Medical History:  Diagnosis Date  . Allergic rhinitis   . Asthma   . Chronic pain syndrome   . Colon polyp   . Diabetes mellitus   . H. pylori infection   . Hyperlipidemia   . Hyperparathyroidism ([place])   . Hypertension   . Hypertensive heart disease without heart failure   . Morbid obesity ([place])   . Sleep apnea   . Vitamin D deficiency     Past Surgical History:  Procedure Laterality Date  . INSERTION OF MESH N/A 06/29/2018   Procedure: INSERTION OF MESH;  Surgeon: Erroll Luna, MD;  Location: [facility];  Service: General;  Laterality: N/A;  . JOINT REPLACEMENT    . UMBILICAL HERNIA REPAIR  06/29/2018  . UMBILICAL HERNIA REPAIR N/A 06/29/2018   Procedure: HERNIA REPAIR UMBILICAL ADULT, incarcerated,;  Surgeon: Erroll Luna, MD;  Location: [facility];  Service: General;  Laterality: N/A;    Family History  Problem Relation Age of Onset  . Prostate cancer Father     Social History   Tobacco Use  . Smoking status: Never Smoker  . Smokeless tobacco: Never Used  Substance Use Topics  . Alcohol use: No  . Drug use: No    Prior to Admission medications   Medication Sig Start Date End Date Taking? Authorizing Provider  albuterol (PROVENTIL HFA;VENTOLIN HFA) 108 (90 BASE) MCG/ACT inhaler  Inhale 2 puffs into the lungs 4 (four) times daily.     [provider]  alum & mag hydroxide-simeth ([place]) 200-200-20 MG/5ML suspension Take 30 mLs by mouth every 6 (six) hours as needed for indigestion or heartburn.    [provider]  amLODipine (NORVASC) 10 MG tablet Take 10 mg by mouth daily.     [provider]  aspirin EC 81 MG tablet Take 81 mg by mouth daily.     [provider]  b complex vitamins capsule Take 1 capsule by mouth daily.    [provider]  CVS D3 5000 units capsule Take 5,000 Units by mouth daily. 06/09/18   [provider]  diclofenac sodium (VOLTAREN) 1 % GEL Apply 1 application topically 4 (four) times daily as needed (back pain).  10/30/16   [provider]  esomeprazole (NEXIUM) 40 MG capsule Take 40 mg by mouth daily. 06/21/18   [provider]  famotidine (PEPCID) 20 MG tablet Take 20 mg by mouth 2 (two) times daily.    [provider]  guaifenesin (ROBITUSSIN) 100 MG/5ML syrup Take 200 mg by mouth every 6 (six) hours as needed for cough.    [provider]  hydrochlorothiazide (HYDRODIURIL) 25 MG tablet Take 25 mg by mouth daily. 06/09/18   [provider]  loperamide (IMODIUM) 2 MG capsule Take 2 mg by mouth as needed for diarrhea or loose stools.    [provider]  magnesium hydroxide (MILK OF MAGNESIA) 400 MG/5ML suspension Take 30 mLs by mouth at bedtime as needed for mild constipation.    [provider]  metFORMIN (GLUCOPHAGE) 1000 MG tablet Take 500 mg by mouth 2 (two) times daily with a meal.     [provider]  mometasone (NASONEX) 50 MCG/ACT nasal spray Place 2 sprays into the nose daily.     [provider]  neomycin-bacitracin-polymyxin (NEOSPORIN) [phone] ointment Apply 1 application topically 4 (four) times daily as needed (minor skin tears/abrasions).    [provider]  oxyCODONE (OXY IR/ROXICODONE) 5 MG  immediate release tablet Take 2 tablets (10 mg total) by mouth every 6 (six) hours as needed for moderate pain. 06/30/18   Jill Alexanders, PA-C  polyethylene glycol (MIRALAX / GLYCOLAX) packet Take 17 g by mouth daily as needed for mild constipation. 06/30/18   Jill Alexanders, PA-C  potassium chloride (K-DUR) 10 MEQ tablet Take 1 tablet (10 mEq total) daily by mouth. 07/17/17   Kayleen Memos, DO  rosuvastatin (CRESTOR) 5 MG tablet Take 5 mg by mouth at bedtime. 06/09/18   [provider]    Allergies Aspirin; Hydrochlorothiazide; Ibuprofen; Latex; and Tramadol   REVIEW OF SYSTEMS  Negative except as noted here or in the History of Present Illness.   PHYSICAL EXAMINATION  Initial Vital Signs Blood pressure (!) 167/62, pulse 85, temperature 98 F (36.7 C), temperature source Oral, resp. rate 17, height 5\' 5"  (1.651 m), weight 90.7 kg, SpO2 98 %.  Examination General: Well-developed, well-nourished female in no acute distress; appearance consistent with age of record HENT: normocephalic; atraumatic Eyes: pupils equal, round and reactive to light; extraocular muscles intact Neck: supple Heart: regular rate and rhythm Lungs: clear to auscultation bilaterally Chest: Tenderness of right back and flank in dermatomal pattern, no rash Abdomen: soft; nondistended; nontender; bowel sounds present Extremities: No deformity; full range of motion; pulses normal; right sciatic notch tenderness Neurologic: Awake, alert and oriented; motor function intact in all extremities and symmetric; no facial droop Skin: Warm and dry Psychiatric: Normal mood and affect   RESULTS  Summary of this visit's results, reviewed by myself:   EKG Interpretation  Date/Time:    Ventricular Rate:    PR Interval:    QRS Duration:   QT Interval:    QTC Calculation:   R Axis:     Text Interpretation:        Laboratory Studies: No results found for this or any previous visit (from the past  24 hour(s)). Imaging Studies: No results found.  ED COURSE and MDM  Nursing notes and initial vitals signs, including pulse oximetry, reviewed.  Vitals:   09/07/18 0405 09/07/18 0407 09/07/18 0411  BP:  (!) 167/62   Pulse:  85   Resp:  17   Temp:  98 F (36.7 C)   TempSrc:  Oral   SpO2: 99% 98%   Weight:   90.7 kg  Height:   5\' 5"  (1.651 m)   Thoracic pain consistent with a thoracic radiculopathy especially given its recurrent nature.  Her right buttock pain is consistent with sciatic pain.  Again this is recurrent.  She has not had any narcotic prescriptions since July of last year.  PROCEDURES    ED DIAGNOSES     ICD-10-CM   1. Thoracic  radiculopathy M54.14   2. Sciatic notch pain, right M54.31        Gyanna Jarema, Jenny Reichmann, MD 09/07/18 [phone]

## 2018-09-07 NOTE — ED Triage Notes (Signed)
Pt arrived via [facility] with complaints of right sided flank pain. Denies recent falls, any urinary symptoms, and no shortness of breath.

## 2018-09-07 NOTE — ED Notes (Signed)
Bed: WA21 Expected date:  Expected time:  Means of arrival:  Comments: 73 y/o F arm pain

## 2018-09-07 NOTE — ED Notes (Signed)
[facility] called for transport.  

## 2018-09-07 NOTE — ED Notes (Signed)
Pt ambulated to the restroom with no difficulty  

## 2018-09-12 DIAGNOSIS — G4733 Obstructive sleep apnea (adult) (pediatric): Secondary | ICD-10-CM | POA: Diagnosis not present

## 2018-09-12 DIAGNOSIS — F0390 Unspecified dementia without behavioral disturbance: Secondary | ICD-10-CM | POA: Diagnosis not present

## 2018-09-12 DIAGNOSIS — E119 Type 2 diabetes mellitus without complications: Secondary | ICD-10-CM | POA: Diagnosis not present

## 2018-09-12 DIAGNOSIS — Z20828 Contact with and (suspected) exposure to other viral communicable diseases: Secondary | ICD-10-CM | POA: Diagnosis not present

## 2018-09-12 DIAGNOSIS — I10 Essential (primary) hypertension: Secondary | ICD-10-CM | POA: Diagnosis not present

## 2018-10-03 DIAGNOSIS — R2681 Unsteadiness on feet: Secondary | ICD-10-CM | POA: Diagnosis not present

## 2018-10-03 DIAGNOSIS — M6281 Muscle weakness (generalized): Secondary | ICD-10-CM | POA: Diagnosis not present

## 2018-10-03 DIAGNOSIS — R278 Other lack of coordination: Secondary | ICD-10-CM | POA: Diagnosis not present

## 2018-10-07 ENCOUNTER — Other Ambulatory Visit: Payer: Self-pay

## 2018-10-07 NOTE — Patient Outreach (Signed)
[facility]) Care Management  10/07/2018  MICHEALA MORISSETTE bossiere 08-10-1946 [id number]    Medication Adherence call to Mrs. [facility] Compliant Voice message left with a call back number.   [facility] Management Direct Dial [phone]  Fax [phone] [email or web address]

## 2018-10-10 DIAGNOSIS — N644 Mastodynia: Secondary | ICD-10-CM | POA: Diagnosis not present

## 2018-10-10 DIAGNOSIS — F0390 Unspecified dementia without behavioral disturbance: Secondary | ICD-10-CM | POA: Diagnosis not present

## 2018-10-10 DIAGNOSIS — E119 Type 2 diabetes mellitus without complications: Secondary | ICD-10-CM | POA: Diagnosis not present

## 2018-10-14 ENCOUNTER — Other Ambulatory Visit: Payer: Self-pay

## 2018-10-14 NOTE — Patient Outreach (Signed)
[facility]) Care Management  10/14/2018  ODILE VELOSO bossiere 1946-02-27 [id number]   Medication Adherence call to Mrs. [place] Compliant Voice message left with a call back number. Telephone call to Patient regarding Medication Adherence unable to reach patient   [facility] Management Direct Dial [phone]  Fax [phone] [email or web address]

## 2018-10-19 ENCOUNTER — Ambulatory Visit
Admission: RE | Admit: 2018-10-19 | Discharge: 2018-10-19 | Disposition: A | Discharge status: Home / Self Care | Payer: PPO | Source: Ambulatory Visit | Attending: Physician Assistant | Admitting: Physician Assistant

## 2018-10-19 DIAGNOSIS — Z1231 Encounter for screening mammogram for malignant neoplasm of breast: Secondary | ICD-10-CM

## 2018-10-31 DIAGNOSIS — M6281 Muscle weakness (generalized): Secondary | ICD-10-CM | POA: Diagnosis not present

## 2018-10-31 DIAGNOSIS — R278 Other lack of coordination: Secondary | ICD-10-CM | POA: Diagnosis not present

## 2018-10-31 DIAGNOSIS — R2681 Unsteadiness on feet: Secondary | ICD-10-CM | POA: Diagnosis not present

## 2018-11-21 DIAGNOSIS — F0390 Unspecified dementia without behavioral disturbance: Secondary | ICD-10-CM | POA: Diagnosis not present

## 2018-11-21 DIAGNOSIS — R4182 Altered mental status, unspecified: Secondary | ICD-10-CM | POA: Diagnosis not present

## 2018-11-21 DIAGNOSIS — E119 Type 2 diabetes mellitus without complications: Secondary | ICD-10-CM | POA: Diagnosis not present

## 2018-11-23 DIAGNOSIS — R4182 Altered mental status, unspecified: Secondary | ICD-10-CM | POA: Diagnosis not present

## 2018-11-23 DIAGNOSIS — D649 Anemia, unspecified: Secondary | ICD-10-CM | POA: Diagnosis not present

## 2018-11-24 DIAGNOSIS — R319 Hematuria, unspecified: Secondary | ICD-10-CM | POA: Diagnosis not present

## 2018-11-24 DIAGNOSIS — R300 Dysuria: Secondary | ICD-10-CM | POA: Diagnosis not present

## 2018-11-28 DIAGNOSIS — E119 Type 2 diabetes mellitus without complications: Secondary | ICD-10-CM | POA: Diagnosis not present

## 2018-11-28 DIAGNOSIS — R4182 Altered mental status, unspecified: Secondary | ICD-10-CM | POA: Diagnosis not present

## 2018-11-28 DIAGNOSIS — F0390 Unspecified dementia without behavioral disturbance: Secondary | ICD-10-CM | POA: Diagnosis not present

## 2018-11-28 DIAGNOSIS — B351 Tinea unguium: Secondary | ICD-10-CM | POA: Diagnosis not present

## 2018-11-30 DIAGNOSIS — M6281 Muscle weakness (generalized): Secondary | ICD-10-CM | POA: Diagnosis not present

## 2018-11-30 DIAGNOSIS — R2681 Unsteadiness on feet: Secondary | ICD-10-CM | POA: Diagnosis not present

## 2018-11-30 DIAGNOSIS — R278 Other lack of coordination: Secondary | ICD-10-CM | POA: Diagnosis not present

## 2018-12-19 DIAGNOSIS — E119 Type 2 diabetes mellitus without complications: Secondary | ICD-10-CM | POA: Diagnosis not present

## 2018-12-19 DIAGNOSIS — R2689 Other abnormalities of gait and mobility: Secondary | ICD-10-CM | POA: Diagnosis not present

## 2018-12-19 DIAGNOSIS — M170 Bilateral primary osteoarthritis of knee: Secondary | ICD-10-CM | POA: Diagnosis not present

## 2018-12-19 DIAGNOSIS — F0390 Unspecified dementia without behavioral disturbance: Secondary | ICD-10-CM | POA: Diagnosis not present

## 2019-01-03 DIAGNOSIS — R293 Abnormal posture: Secondary | ICD-10-CM | POA: Diagnosis not present

## 2019-01-03 DIAGNOSIS — R278 Other lack of coordination: Secondary | ICD-10-CM | POA: Diagnosis not present

## 2019-01-03 DIAGNOSIS — R2689 Other abnormalities of gait and mobility: Secondary | ICD-10-CM | POA: Diagnosis not present

## 2019-01-03 DIAGNOSIS — M6281 Muscle weakness (generalized): Secondary | ICD-10-CM | POA: Diagnosis not present

## 2019-01-06 DIAGNOSIS — M6281 Muscle weakness (generalized): Secondary | ICD-10-CM | POA: Diagnosis not present

## 2019-01-06 DIAGNOSIS — R2689 Other abnormalities of gait and mobility: Secondary | ICD-10-CM | POA: Diagnosis not present

## 2019-01-06 DIAGNOSIS — R278 Other lack of coordination: Secondary | ICD-10-CM | POA: Diagnosis not present

## 2019-01-06 DIAGNOSIS — R293 Abnormal posture: Secondary | ICD-10-CM | POA: Diagnosis not present

## 2019-01-09 DIAGNOSIS — R2689 Other abnormalities of gait and mobility: Secondary | ICD-10-CM | POA: Diagnosis not present

## 2019-01-09 DIAGNOSIS — R278 Other lack of coordination: Secondary | ICD-10-CM | POA: Diagnosis not present

## 2019-01-09 DIAGNOSIS — R293 Abnormal posture: Secondary | ICD-10-CM | POA: Diagnosis not present

## 2019-01-09 DIAGNOSIS — M6281 Muscle weakness (generalized): Secondary | ICD-10-CM | POA: Diagnosis not present

## 2019-01-10 DIAGNOSIS — R278 Other lack of coordination: Secondary | ICD-10-CM | POA: Diagnosis not present

## 2019-01-10 DIAGNOSIS — R293 Abnormal posture: Secondary | ICD-10-CM | POA: Diagnosis not present

## 2019-01-10 DIAGNOSIS — R2689 Other abnormalities of gait and mobility: Secondary | ICD-10-CM | POA: Diagnosis not present

## 2019-01-10 DIAGNOSIS — M6281 Muscle weakness (generalized): Secondary | ICD-10-CM | POA: Diagnosis not present

## 2019-01-12 DIAGNOSIS — R2689 Other abnormalities of gait and mobility: Secondary | ICD-10-CM | POA: Diagnosis not present

## 2019-01-12 DIAGNOSIS — M6281 Muscle weakness (generalized): Secondary | ICD-10-CM | POA: Diagnosis not present

## 2019-01-13 DIAGNOSIS — M6281 Muscle weakness (generalized): Secondary | ICD-10-CM | POA: Diagnosis not present

## 2019-01-13 DIAGNOSIS — R278 Other lack of coordination: Secondary | ICD-10-CM | POA: Diagnosis not present

## 2019-01-13 DIAGNOSIS — R293 Abnormal posture: Secondary | ICD-10-CM | POA: Diagnosis not present

## 2019-01-16 DIAGNOSIS — R293 Abnormal posture: Secondary | ICD-10-CM | POA: Diagnosis not present

## 2019-01-16 DIAGNOSIS — R2689 Other abnormalities of gait and mobility: Secondary | ICD-10-CM | POA: Diagnosis not present

## 2019-01-16 DIAGNOSIS — M6281 Muscle weakness (generalized): Secondary | ICD-10-CM | POA: Diagnosis not present

## 2019-01-16 DIAGNOSIS — R278 Other lack of coordination: Secondary | ICD-10-CM | POA: Diagnosis not present

## 2019-01-17 DIAGNOSIS — R293 Abnormal posture: Secondary | ICD-10-CM | POA: Diagnosis not present

## 2019-01-17 DIAGNOSIS — R278 Other lack of coordination: Secondary | ICD-10-CM | POA: Diagnosis not present

## 2019-01-17 DIAGNOSIS — R2689 Other abnormalities of gait and mobility: Secondary | ICD-10-CM | POA: Diagnosis not present

## 2019-01-17 DIAGNOSIS — M6281 Muscle weakness (generalized): Secondary | ICD-10-CM | POA: Diagnosis not present

## 2019-01-19 DIAGNOSIS — R293 Abnormal posture: Secondary | ICD-10-CM | POA: Diagnosis not present

## 2019-01-19 DIAGNOSIS — M6281 Muscle weakness (generalized): Secondary | ICD-10-CM | POA: Diagnosis not present

## 2019-01-19 DIAGNOSIS — R278 Other lack of coordination: Secondary | ICD-10-CM | POA: Diagnosis not present

## 2019-01-19 DIAGNOSIS — R2689 Other abnormalities of gait and mobility: Secondary | ICD-10-CM | POA: Diagnosis not present

## 2019-01-23 DIAGNOSIS — R293 Abnormal posture: Secondary | ICD-10-CM | POA: Diagnosis not present

## 2019-01-23 DIAGNOSIS — R2689 Other abnormalities of gait and mobility: Secondary | ICD-10-CM | POA: Diagnosis not present

## 2019-01-23 DIAGNOSIS — R278 Other lack of coordination: Secondary | ICD-10-CM | POA: Diagnosis not present

## 2019-01-23 DIAGNOSIS — M6281 Muscle weakness (generalized): Secondary | ICD-10-CM | POA: Diagnosis not present

## 2019-01-25 DIAGNOSIS — R293 Abnormal posture: Secondary | ICD-10-CM | POA: Diagnosis not present

## 2019-01-25 DIAGNOSIS — M6281 Muscle weakness (generalized): Secondary | ICD-10-CM | POA: Diagnosis not present

## 2019-01-25 DIAGNOSIS — R2689 Other abnormalities of gait and mobility: Secondary | ICD-10-CM | POA: Diagnosis not present

## 2019-01-25 DIAGNOSIS — R278 Other lack of coordination: Secondary | ICD-10-CM | POA: Diagnosis not present

## 2019-01-26 DIAGNOSIS — R2689 Other abnormalities of gait and mobility: Secondary | ICD-10-CM | POA: Diagnosis not present

## 2019-01-26 DIAGNOSIS — M6281 Muscle weakness (generalized): Secondary | ICD-10-CM | POA: Diagnosis not present

## 2019-01-27 DIAGNOSIS — M6281 Muscle weakness (generalized): Secondary | ICD-10-CM | POA: Diagnosis not present

## 2019-01-27 DIAGNOSIS — R2689 Other abnormalities of gait and mobility: Secondary | ICD-10-CM | POA: Diagnosis not present

## 2019-01-27 DIAGNOSIS — R293 Abnormal posture: Secondary | ICD-10-CM | POA: Diagnosis not present

## 2019-01-27 DIAGNOSIS — R278 Other lack of coordination: Secondary | ICD-10-CM | POA: Diagnosis not present

## 2019-01-31 DIAGNOSIS — R293 Abnormal posture: Secondary | ICD-10-CM | POA: Diagnosis not present

## 2019-01-31 DIAGNOSIS — R278 Other lack of coordination: Secondary | ICD-10-CM | POA: Diagnosis not present

## 2019-01-31 DIAGNOSIS — M6281 Muscle weakness (generalized): Secondary | ICD-10-CM | POA: Diagnosis not present

## 2019-01-31 DIAGNOSIS — R2689 Other abnormalities of gait and mobility: Secondary | ICD-10-CM | POA: Diagnosis not present

## 2019-02-02 DIAGNOSIS — R2689 Other abnormalities of gait and mobility: Secondary | ICD-10-CM | POA: Diagnosis not present

## 2019-02-02 DIAGNOSIS — R278 Other lack of coordination: Secondary | ICD-10-CM | POA: Diagnosis not present

## 2019-02-02 DIAGNOSIS — R293 Abnormal posture: Secondary | ICD-10-CM | POA: Diagnosis not present

## 2019-02-02 DIAGNOSIS — M6281 Muscle weakness (generalized): Secondary | ICD-10-CM | POA: Diagnosis not present

## 2019-02-03 DIAGNOSIS — R293 Abnormal posture: Secondary | ICD-10-CM | POA: Diagnosis not present

## 2019-02-03 DIAGNOSIS — R278 Other lack of coordination: Secondary | ICD-10-CM | POA: Diagnosis not present

## 2019-02-03 DIAGNOSIS — M6281 Muscle weakness (generalized): Secondary | ICD-10-CM | POA: Diagnosis not present

## 2019-02-03 DIAGNOSIS — R2689 Other abnormalities of gait and mobility: Secondary | ICD-10-CM | POA: Diagnosis not present

## 2019-02-07 DIAGNOSIS — R293 Abnormal posture: Secondary | ICD-10-CM | POA: Diagnosis not present

## 2019-02-07 DIAGNOSIS — M6281 Muscle weakness (generalized): Secondary | ICD-10-CM | POA: Diagnosis not present

## 2019-02-07 DIAGNOSIS — R278 Other lack of coordination: Secondary | ICD-10-CM | POA: Diagnosis not present

## 2019-02-07 DIAGNOSIS — R2689 Other abnormalities of gait and mobility: Secondary | ICD-10-CM | POA: Diagnosis not present

## 2019-02-08 DIAGNOSIS — M6281 Muscle weakness (generalized): Secondary | ICD-10-CM | POA: Diagnosis not present

## 2019-02-08 DIAGNOSIS — R2689 Other abnormalities of gait and mobility: Secondary | ICD-10-CM | POA: Diagnosis not present

## 2019-02-08 DIAGNOSIS — R278 Other lack of coordination: Secondary | ICD-10-CM | POA: Diagnosis not present

## 2019-02-08 DIAGNOSIS — R293 Abnormal posture: Secondary | ICD-10-CM | POA: Diagnosis not present

## 2019-02-09 DIAGNOSIS — R278 Other lack of coordination: Secondary | ICD-10-CM | POA: Diagnosis not present

## 2019-02-09 DIAGNOSIS — R293 Abnormal posture: Secondary | ICD-10-CM | POA: Diagnosis not present

## 2019-02-09 DIAGNOSIS — M6281 Muscle weakness (generalized): Secondary | ICD-10-CM | POA: Diagnosis not present

## 2019-02-10 DIAGNOSIS — M6281 Muscle weakness (generalized): Secondary | ICD-10-CM | POA: Diagnosis not present

## 2019-02-10 DIAGNOSIS — R2689 Other abnormalities of gait and mobility: Secondary | ICD-10-CM | POA: Diagnosis not present

## 2019-02-14 DIAGNOSIS — R293 Abnormal posture: Secondary | ICD-10-CM | POA: Diagnosis not present

## 2019-02-14 DIAGNOSIS — R278 Other lack of coordination: Secondary | ICD-10-CM | POA: Diagnosis not present

## 2019-02-14 DIAGNOSIS — M6281 Muscle weakness (generalized): Secondary | ICD-10-CM | POA: Diagnosis not present

## 2019-02-14 DIAGNOSIS — R2689 Other abnormalities of gait and mobility: Secondary | ICD-10-CM | POA: Diagnosis not present

## 2019-02-16 DIAGNOSIS — R2689 Other abnormalities of gait and mobility: Secondary | ICD-10-CM | POA: Diagnosis not present

## 2019-02-16 DIAGNOSIS — M6281 Muscle weakness (generalized): Secondary | ICD-10-CM | POA: Diagnosis not present

## 2019-02-17 DIAGNOSIS — R293 Abnormal posture: Secondary | ICD-10-CM | POA: Diagnosis not present

## 2019-02-17 DIAGNOSIS — R278 Other lack of coordination: Secondary | ICD-10-CM | POA: Diagnosis not present

## 2019-02-17 DIAGNOSIS — M6281 Muscle weakness (generalized): Secondary | ICD-10-CM | POA: Diagnosis not present

## 2019-02-17 DIAGNOSIS — R2689 Other abnormalities of gait and mobility: Secondary | ICD-10-CM | POA: Diagnosis not present

## 2019-02-20 DIAGNOSIS — R278 Other lack of coordination: Secondary | ICD-10-CM | POA: Diagnosis not present

## 2019-02-20 DIAGNOSIS — R293 Abnormal posture: Secondary | ICD-10-CM | POA: Diagnosis not present

## 2019-02-20 DIAGNOSIS — M6281 Muscle weakness (generalized): Secondary | ICD-10-CM | POA: Diagnosis not present

## 2019-02-21 DIAGNOSIS — R2689 Other abnormalities of gait and mobility: Secondary | ICD-10-CM | POA: Diagnosis not present

## 2019-02-21 DIAGNOSIS — M6281 Muscle weakness (generalized): Secondary | ICD-10-CM | POA: Diagnosis not present

## 2019-02-21 DIAGNOSIS — R278 Other lack of coordination: Secondary | ICD-10-CM | POA: Diagnosis not present

## 2019-02-21 DIAGNOSIS — R293 Abnormal posture: Secondary | ICD-10-CM | POA: Diagnosis not present

## 2019-02-22 DIAGNOSIS — R2689 Other abnormalities of gait and mobility: Secondary | ICD-10-CM | POA: Diagnosis not present

## 2019-02-22 DIAGNOSIS — M6281 Muscle weakness (generalized): Secondary | ICD-10-CM | POA: Diagnosis not present

## 2019-02-23 DIAGNOSIS — R293 Abnormal posture: Secondary | ICD-10-CM | POA: Diagnosis not present

## 2019-02-23 DIAGNOSIS — R278 Other lack of coordination: Secondary | ICD-10-CM | POA: Diagnosis not present

## 2019-02-23 DIAGNOSIS — M6281 Muscle weakness (generalized): Secondary | ICD-10-CM | POA: Diagnosis not present

## 2019-02-24 DIAGNOSIS — M6281 Muscle weakness (generalized): Secondary | ICD-10-CM | POA: Diagnosis not present

## 2019-02-24 DIAGNOSIS — R278 Other lack of coordination: Secondary | ICD-10-CM | POA: Diagnosis not present

## 2019-02-24 DIAGNOSIS — R2689 Other abnormalities of gait and mobility: Secondary | ICD-10-CM | POA: Diagnosis not present

## 2019-02-24 DIAGNOSIS — R293 Abnormal posture: Secondary | ICD-10-CM | POA: Diagnosis not present

## 2019-02-27 DIAGNOSIS — L02212 Cutaneous abscess of back [any part, except buttock]: Secondary | ICD-10-CM | POA: Diagnosis not present

## 2019-02-28 DIAGNOSIS — M6281 Muscle weakness (generalized): Secondary | ICD-10-CM | POA: Diagnosis not present

## 2019-02-28 DIAGNOSIS — R293 Abnormal posture: Secondary | ICD-10-CM | POA: Diagnosis not present

## 2019-02-28 DIAGNOSIS — R2689 Other abnormalities of gait and mobility: Secondary | ICD-10-CM | POA: Diagnosis not present

## 2019-02-28 DIAGNOSIS — R278 Other lack of coordination: Secondary | ICD-10-CM | POA: Diagnosis not present

## 2019-03-01 DIAGNOSIS — R293 Abnormal posture: Secondary | ICD-10-CM | POA: Diagnosis not present

## 2019-03-01 DIAGNOSIS — M6281 Muscle weakness (generalized): Secondary | ICD-10-CM | POA: Diagnosis not present

## 2019-03-01 DIAGNOSIS — R278 Other lack of coordination: Secondary | ICD-10-CM | POA: Diagnosis not present

## 2019-03-02 DIAGNOSIS — R2689 Other abnormalities of gait and mobility: Secondary | ICD-10-CM | POA: Diagnosis not present

## 2019-03-02 DIAGNOSIS — M6281 Muscle weakness (generalized): Secondary | ICD-10-CM | POA: Diagnosis not present

## 2019-03-03 DIAGNOSIS — R2689 Other abnormalities of gait and mobility: Secondary | ICD-10-CM | POA: Diagnosis not present

## 2019-03-03 DIAGNOSIS — M6281 Muscle weakness (generalized): Secondary | ICD-10-CM | POA: Diagnosis not present

## 2019-03-06 DIAGNOSIS — R278 Other lack of coordination: Secondary | ICD-10-CM | POA: Diagnosis not present

## 2019-03-06 DIAGNOSIS — R293 Abnormal posture: Secondary | ICD-10-CM | POA: Diagnosis not present

## 2019-03-06 DIAGNOSIS — M6281 Muscle weakness (generalized): Secondary | ICD-10-CM | POA: Diagnosis not present

## 2019-03-07 DIAGNOSIS — R278 Other lack of coordination: Secondary | ICD-10-CM | POA: Diagnosis not present

## 2019-03-07 DIAGNOSIS — R293 Abnormal posture: Secondary | ICD-10-CM | POA: Diagnosis not present

## 2019-03-07 DIAGNOSIS — M6281 Muscle weakness (generalized): Secondary | ICD-10-CM | POA: Diagnosis not present

## 2019-03-08 DIAGNOSIS — M6281 Muscle weakness (generalized): Secondary | ICD-10-CM | POA: Diagnosis not present

## 2019-03-08 DIAGNOSIS — R293 Abnormal posture: Secondary | ICD-10-CM | POA: Diagnosis not present

## 2019-03-08 DIAGNOSIS — R278 Other lack of coordination: Secondary | ICD-10-CM | POA: Diagnosis not present

## 2019-03-09 DIAGNOSIS — R278 Other lack of coordination: Secondary | ICD-10-CM | POA: Diagnosis not present

## 2019-03-09 DIAGNOSIS — R293 Abnormal posture: Secondary | ICD-10-CM | POA: Diagnosis not present

## 2019-03-09 DIAGNOSIS — M6281 Muscle weakness (generalized): Secondary | ICD-10-CM | POA: Diagnosis not present

## 2019-03-09 DIAGNOSIS — R2689 Other abnormalities of gait and mobility: Secondary | ICD-10-CM | POA: Diagnosis not present

## 2019-03-10 DIAGNOSIS — R2689 Other abnormalities of gait and mobility: Secondary | ICD-10-CM | POA: Diagnosis not present

## 2019-03-10 DIAGNOSIS — M6281 Muscle weakness (generalized): Secondary | ICD-10-CM | POA: Diagnosis not present

## 2019-03-13 DIAGNOSIS — M6281 Muscle weakness (generalized): Secondary | ICD-10-CM | POA: Diagnosis not present

## 2019-03-13 DIAGNOSIS — R2689 Other abnormalities of gait and mobility: Secondary | ICD-10-CM | POA: Diagnosis not present

## 2019-03-14 DIAGNOSIS — R2689 Other abnormalities of gait and mobility: Secondary | ICD-10-CM | POA: Diagnosis not present

## 2019-03-14 DIAGNOSIS — M6281 Muscle weakness (generalized): Secondary | ICD-10-CM | POA: Diagnosis not present

## 2019-03-16 DIAGNOSIS — R2689 Other abnormalities of gait and mobility: Secondary | ICD-10-CM | POA: Diagnosis not present

## 2019-03-16 DIAGNOSIS — R278 Other lack of coordination: Secondary | ICD-10-CM | POA: Diagnosis not present

## 2019-03-16 DIAGNOSIS — R293 Abnormal posture: Secondary | ICD-10-CM | POA: Diagnosis not present

## 2019-03-16 DIAGNOSIS — M6281 Muscle weakness (generalized): Secondary | ICD-10-CM | POA: Diagnosis not present

## 2019-03-17 DIAGNOSIS — R293 Abnormal posture: Secondary | ICD-10-CM | POA: Diagnosis not present

## 2019-03-17 DIAGNOSIS — M6281 Muscle weakness (generalized): Secondary | ICD-10-CM | POA: Diagnosis not present

## 2019-03-17 DIAGNOSIS — R278 Other lack of coordination: Secondary | ICD-10-CM | POA: Diagnosis not present

## 2019-03-17 DIAGNOSIS — R2689 Other abnormalities of gait and mobility: Secondary | ICD-10-CM | POA: Diagnosis not present

## 2019-03-20 DIAGNOSIS — M6281 Muscle weakness (generalized): Secondary | ICD-10-CM | POA: Diagnosis not present

## 2019-03-20 DIAGNOSIS — R278 Other lack of coordination: Secondary | ICD-10-CM | POA: Diagnosis not present

## 2019-03-20 DIAGNOSIS — R293 Abnormal posture: Secondary | ICD-10-CM | POA: Diagnosis not present

## 2019-03-21 DIAGNOSIS — R2689 Other abnormalities of gait and mobility: Secondary | ICD-10-CM | POA: Diagnosis not present

## 2019-03-21 DIAGNOSIS — M6281 Muscle weakness (generalized): Secondary | ICD-10-CM | POA: Diagnosis not present

## 2019-03-22 DIAGNOSIS — M6281 Muscle weakness (generalized): Secondary | ICD-10-CM | POA: Diagnosis not present

## 2019-03-22 DIAGNOSIS — R293 Abnormal posture: Secondary | ICD-10-CM | POA: Diagnosis not present

## 2019-03-22 DIAGNOSIS — R278 Other lack of coordination: Secondary | ICD-10-CM | POA: Diagnosis not present

## 2019-03-23 DIAGNOSIS — R278 Other lack of coordination: Secondary | ICD-10-CM | POA: Diagnosis not present

## 2019-03-23 DIAGNOSIS — R293 Abnormal posture: Secondary | ICD-10-CM | POA: Diagnosis not present

## 2019-03-23 DIAGNOSIS — M6281 Muscle weakness (generalized): Secondary | ICD-10-CM | POA: Diagnosis not present

## 2019-03-23 DIAGNOSIS — R2689 Other abnormalities of gait and mobility: Secondary | ICD-10-CM | POA: Diagnosis not present

## 2019-03-27 DIAGNOSIS — M6281 Muscle weakness (generalized): Secondary | ICD-10-CM | POA: Diagnosis not present

## 2019-03-27 DIAGNOSIS — R2689 Other abnormalities of gait and mobility: Secondary | ICD-10-CM | POA: Diagnosis not present

## 2019-03-28 DIAGNOSIS — M6281 Muscle weakness (generalized): Secondary | ICD-10-CM | POA: Diagnosis not present

## 2019-03-28 DIAGNOSIS — R2689 Other abnormalities of gait and mobility: Secondary | ICD-10-CM | POA: Diagnosis not present

## 2019-03-29 DIAGNOSIS — R278 Other lack of coordination: Secondary | ICD-10-CM | POA: Diagnosis not present

## 2019-03-29 DIAGNOSIS — R293 Abnormal posture: Secondary | ICD-10-CM | POA: Diagnosis not present

## 2019-03-29 DIAGNOSIS — M6281 Muscle weakness (generalized): Secondary | ICD-10-CM | POA: Diagnosis not present

## 2019-03-31 DIAGNOSIS — R2689 Other abnormalities of gait and mobility: Secondary | ICD-10-CM | POA: Diagnosis not present

## 2019-03-31 DIAGNOSIS — R278 Other lack of coordination: Secondary | ICD-10-CM | POA: Diagnosis not present

## 2019-03-31 DIAGNOSIS — R293 Abnormal posture: Secondary | ICD-10-CM | POA: Diagnosis not present

## 2019-03-31 DIAGNOSIS — M6281 Muscle weakness (generalized): Secondary | ICD-10-CM | POA: Diagnosis not present

## 2019-04-03 DIAGNOSIS — R2689 Other abnormalities of gait and mobility: Secondary | ICD-10-CM | POA: Diagnosis not present

## 2019-04-03 DIAGNOSIS — R278 Other lack of coordination: Secondary | ICD-10-CM | POA: Diagnosis not present

## 2019-04-03 DIAGNOSIS — R293 Abnormal posture: Secondary | ICD-10-CM | POA: Diagnosis not present

## 2019-04-03 DIAGNOSIS — M6281 Muscle weakness (generalized): Secondary | ICD-10-CM | POA: Diagnosis not present

## 2019-04-04 DIAGNOSIS — R2689 Other abnormalities of gait and mobility: Secondary | ICD-10-CM | POA: Diagnosis not present

## 2019-04-04 DIAGNOSIS — M6281 Muscle weakness (generalized): Secondary | ICD-10-CM | POA: Diagnosis not present

## 2019-04-05 DIAGNOSIS — M6281 Muscle weakness (generalized): Secondary | ICD-10-CM | POA: Diagnosis not present

## 2019-04-05 DIAGNOSIS — R293 Abnormal posture: Secondary | ICD-10-CM | POA: Diagnosis not present

## 2019-04-05 DIAGNOSIS — R278 Other lack of coordination: Secondary | ICD-10-CM | POA: Diagnosis not present

## 2019-04-05 IMAGING — MG DIGITAL SCREENING BILATERAL MAMMOGRAM WITH CAD
5 series · 5 of 5 positions shown · non-contrast
Comparison: Previous exam(s).

CLINICAL DATA: Screening.

EXAM:
DIGITAL SCREENING BILATERAL MAMMOGRAM WITH CAD

[R MLO]
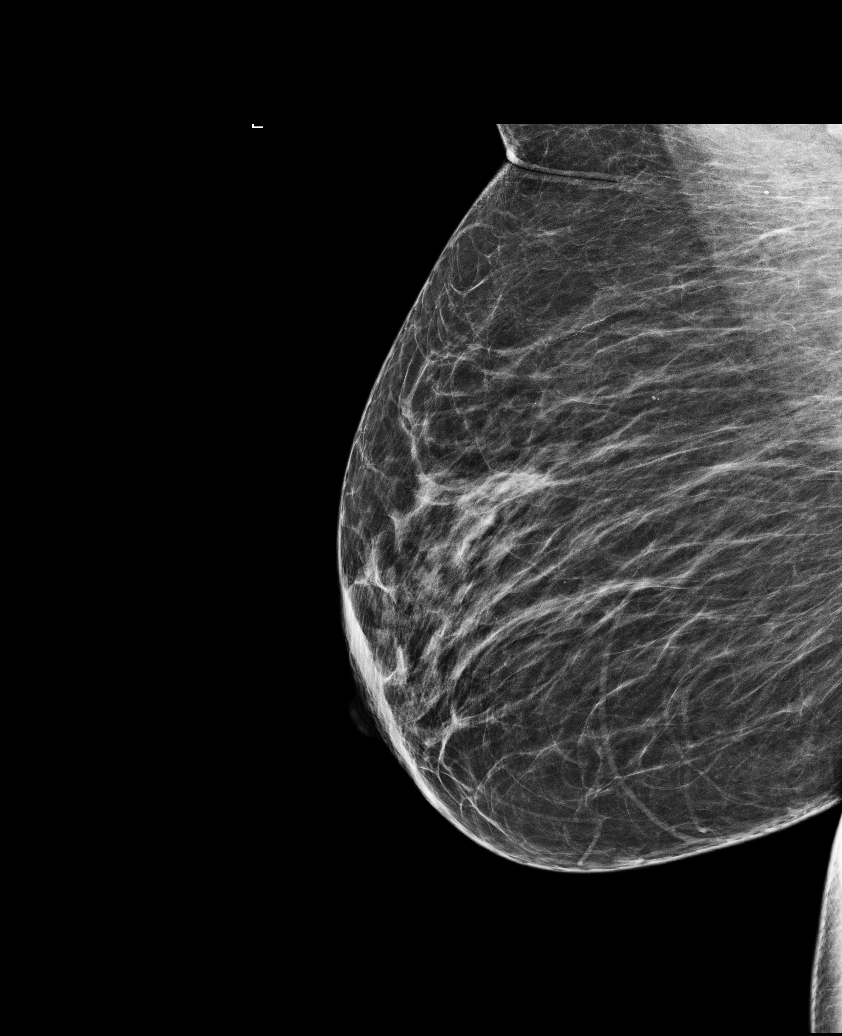

[R CC]
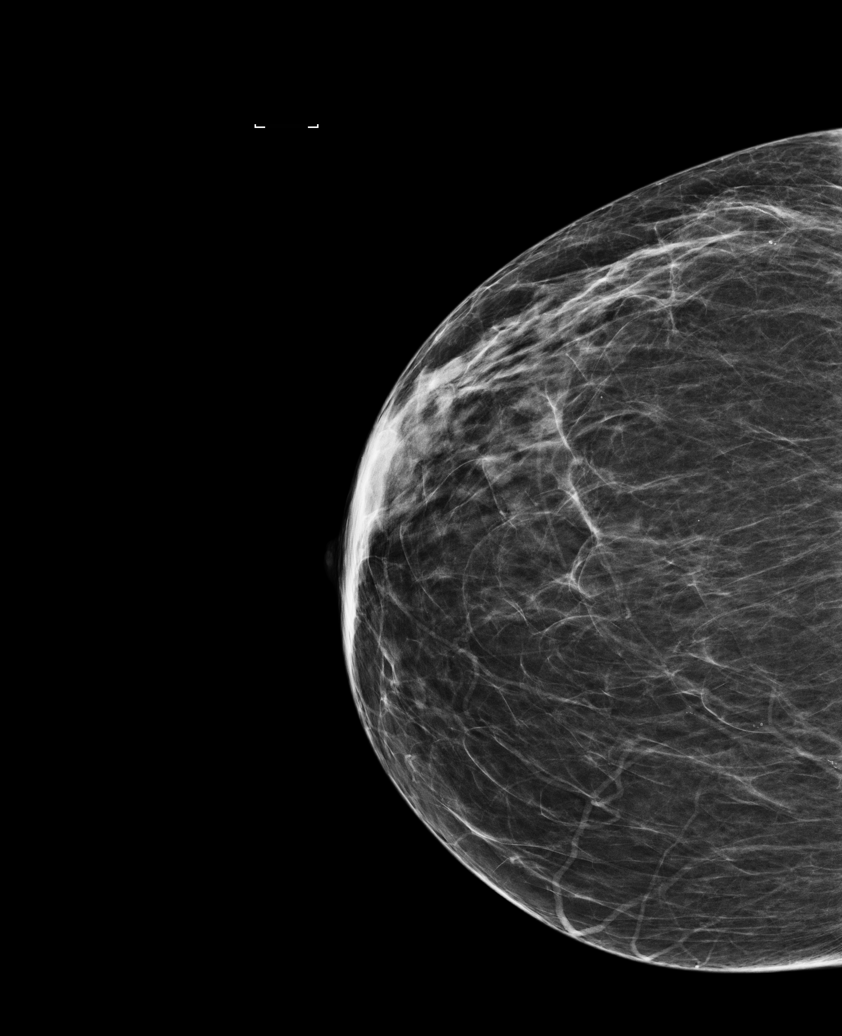

[L MLO (1 of 2)]
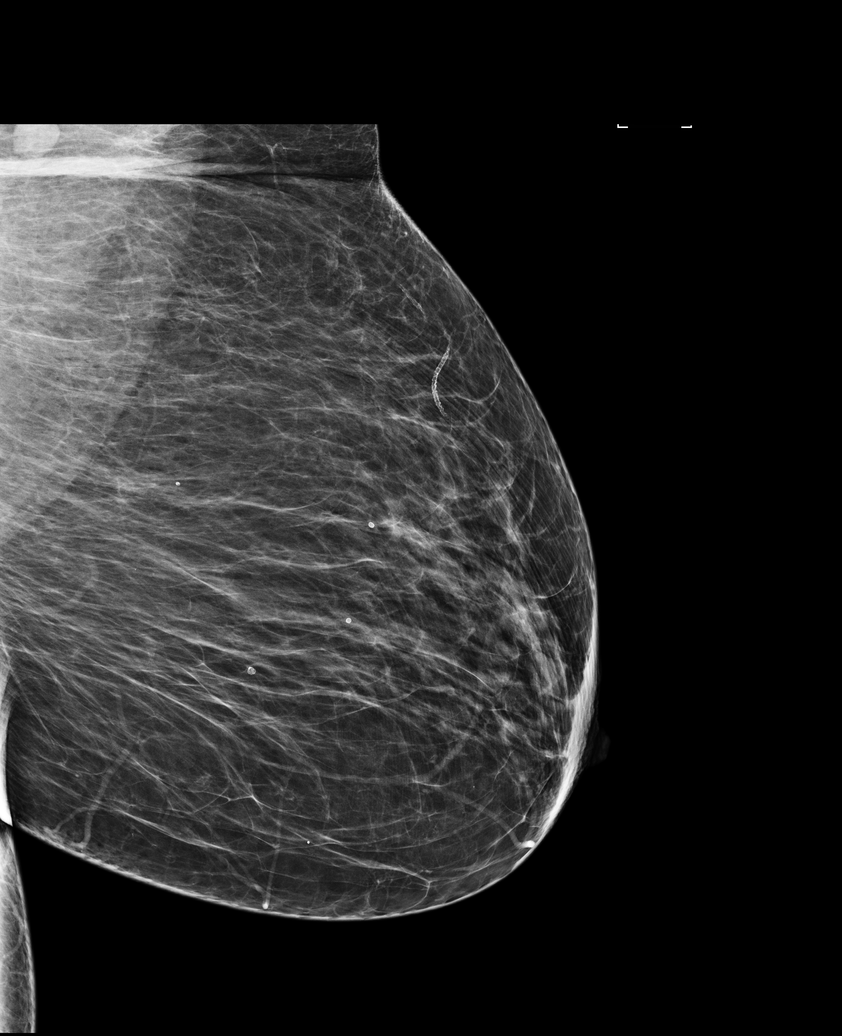

[L MLO (2 of 2)]
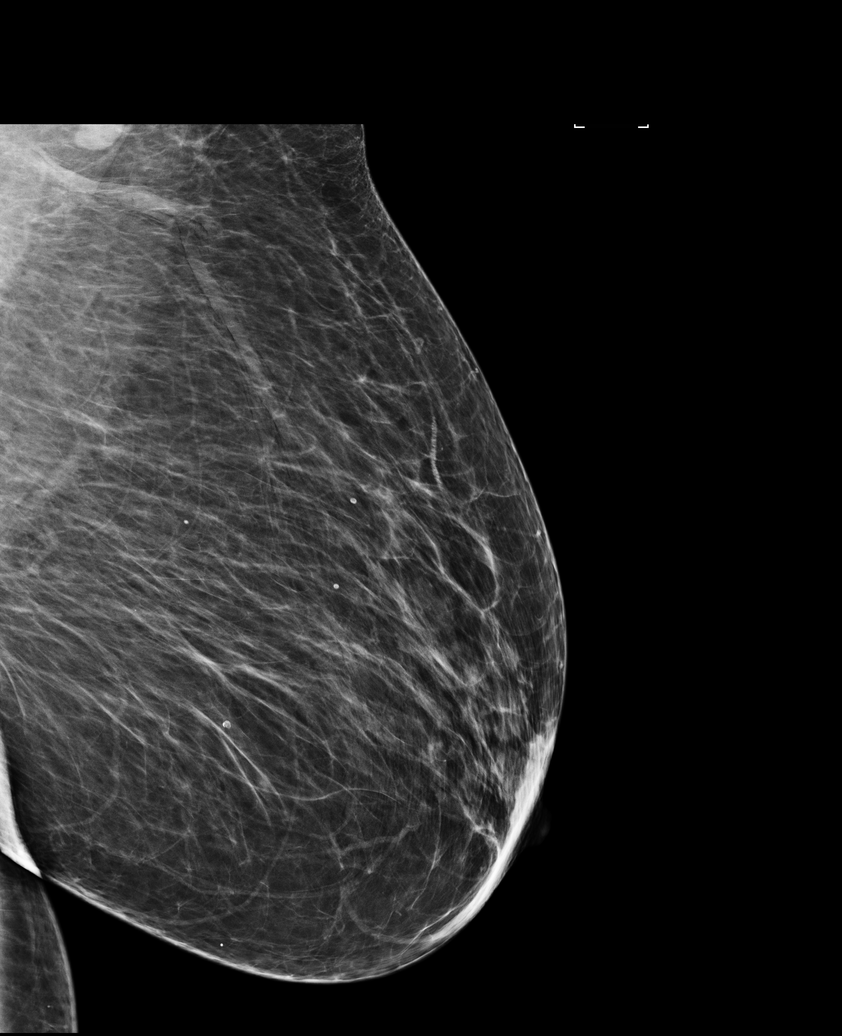

[L CC]
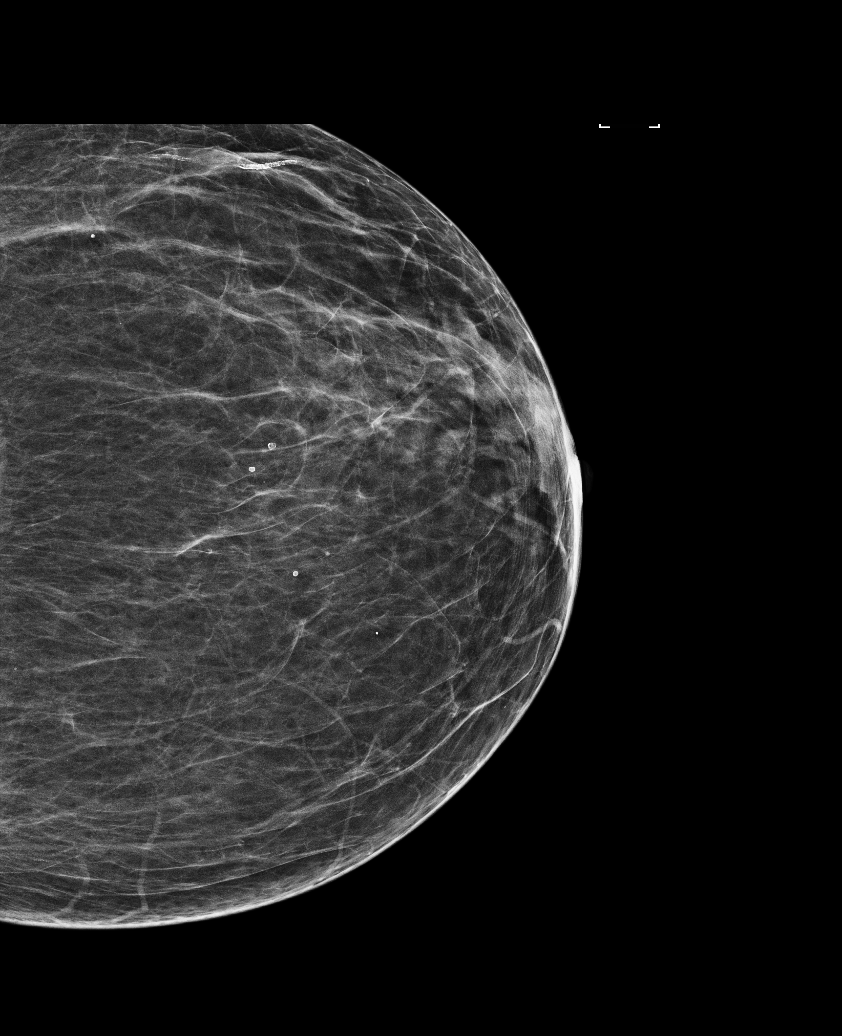

[5 of 5 positions shown; findings below may reference images not displayed]

ACR Breast Density Category b: There are scattered areas of
fibroglandular density.
FINDINGS: There are no findings suspicious for malignancy. Images were
processed with CAD.
IMPRESSION: No mammographic evidence of malignancy. A result letter of this
screening mammogram will be mailed directly to the patient.

RECOMMENDATION:
Screening mammogram in one year. (Code:AS-G-LCT)

BI-RADS CATEGORY  1: Negative.

## 2019-04-06 DIAGNOSIS — R293 Abnormal posture: Secondary | ICD-10-CM | POA: Diagnosis not present

## 2019-04-06 DIAGNOSIS — R278 Other lack of coordination: Secondary | ICD-10-CM | POA: Diagnosis not present

## 2019-04-06 DIAGNOSIS — M6281 Muscle weakness (generalized): Secondary | ICD-10-CM | POA: Diagnosis not present

## 2019-04-06 DIAGNOSIS — R2689 Other abnormalities of gait and mobility: Secondary | ICD-10-CM | POA: Diagnosis not present

## 2019-04-10 DIAGNOSIS — R2689 Other abnormalities of gait and mobility: Secondary | ICD-10-CM | POA: Diagnosis not present

## 2019-04-10 DIAGNOSIS — R278 Other lack of coordination: Secondary | ICD-10-CM | POA: Diagnosis not present

## 2019-04-10 DIAGNOSIS — R293 Abnormal posture: Secondary | ICD-10-CM | POA: Diagnosis not present

## 2019-04-10 DIAGNOSIS — M6281 Muscle weakness (generalized): Secondary | ICD-10-CM | POA: Diagnosis not present

## 2019-04-11 DIAGNOSIS — M6281 Muscle weakness (generalized): Secondary | ICD-10-CM | POA: Diagnosis not present

## 2019-04-11 DIAGNOSIS — R2689 Other abnormalities of gait and mobility: Secondary | ICD-10-CM | POA: Diagnosis not present

## 2019-04-12 DIAGNOSIS — R278 Other lack of coordination: Secondary | ICD-10-CM | POA: Diagnosis not present

## 2019-04-12 DIAGNOSIS — R293 Abnormal posture: Secondary | ICD-10-CM | POA: Diagnosis not present

## 2019-04-12 DIAGNOSIS — M6281 Muscle weakness (generalized): Secondary | ICD-10-CM | POA: Diagnosis not present

## 2019-04-13 DIAGNOSIS — R2689 Other abnormalities of gait and mobility: Secondary | ICD-10-CM | POA: Diagnosis not present

## 2019-04-13 DIAGNOSIS — M6281 Muscle weakness (generalized): Secondary | ICD-10-CM | POA: Diagnosis not present

## 2019-04-14 DIAGNOSIS — R278 Other lack of coordination: Secondary | ICD-10-CM | POA: Diagnosis not present

## 2019-04-14 DIAGNOSIS — M6281 Muscle weakness (generalized): Secondary | ICD-10-CM | POA: Diagnosis not present

## 2019-04-14 DIAGNOSIS — R293 Abnormal posture: Secondary | ICD-10-CM | POA: Diagnosis not present

## 2019-04-17 DIAGNOSIS — R293 Abnormal posture: Secondary | ICD-10-CM | POA: Diagnosis not present

## 2019-04-17 DIAGNOSIS — R2689 Other abnormalities of gait and mobility: Secondary | ICD-10-CM | POA: Diagnosis not present

## 2019-04-17 DIAGNOSIS — M6281 Muscle weakness (generalized): Secondary | ICD-10-CM | POA: Diagnosis not present

## 2019-04-17 DIAGNOSIS — R278 Other lack of coordination: Secondary | ICD-10-CM | POA: Diagnosis not present

## 2019-04-18 DIAGNOSIS — M6281 Muscle weakness (generalized): Secondary | ICD-10-CM | POA: Diagnosis not present

## 2019-04-18 DIAGNOSIS — R2689 Other abnormalities of gait and mobility: Secondary | ICD-10-CM | POA: Diagnosis not present

## 2019-04-19 DIAGNOSIS — M6281 Muscle weakness (generalized): Secondary | ICD-10-CM | POA: Diagnosis not present

## 2019-04-19 DIAGNOSIS — R278 Other lack of coordination: Secondary | ICD-10-CM | POA: Diagnosis not present

## 2019-04-19 DIAGNOSIS — R2689 Other abnormalities of gait and mobility: Secondary | ICD-10-CM | POA: Diagnosis not present

## 2019-04-19 DIAGNOSIS — R293 Abnormal posture: Secondary | ICD-10-CM | POA: Diagnosis not present

## 2019-04-20 DIAGNOSIS — R293 Abnormal posture: Secondary | ICD-10-CM | POA: Diagnosis not present

## 2019-04-20 DIAGNOSIS — R2689 Other abnormalities of gait and mobility: Secondary | ICD-10-CM | POA: Diagnosis not present

## 2019-04-20 DIAGNOSIS — M6281 Muscle weakness (generalized): Secondary | ICD-10-CM | POA: Diagnosis not present

## 2019-04-20 DIAGNOSIS — R278 Other lack of coordination: Secondary | ICD-10-CM | POA: Diagnosis not present

## 2019-04-21 DIAGNOSIS — R293 Abnormal posture: Secondary | ICD-10-CM | POA: Diagnosis not present

## 2019-04-21 DIAGNOSIS — R278 Other lack of coordination: Secondary | ICD-10-CM | POA: Diagnosis not present

## 2019-04-21 DIAGNOSIS — M6281 Muscle weakness (generalized): Secondary | ICD-10-CM | POA: Diagnosis not present

## 2019-04-25 DIAGNOSIS — M6281 Muscle weakness (generalized): Secondary | ICD-10-CM | POA: Diagnosis not present

## 2019-04-25 DIAGNOSIS — R2689 Other abnormalities of gait and mobility: Secondary | ICD-10-CM | POA: Diagnosis not present

## 2019-04-25 DIAGNOSIS — R278 Other lack of coordination: Secondary | ICD-10-CM | POA: Diagnosis not present

## 2019-04-25 DIAGNOSIS — R293 Abnormal posture: Secondary | ICD-10-CM | POA: Diagnosis not present

## 2019-04-26 DIAGNOSIS — R293 Abnormal posture: Secondary | ICD-10-CM | POA: Diagnosis not present

## 2019-04-26 DIAGNOSIS — M6281 Muscle weakness (generalized): Secondary | ICD-10-CM | POA: Diagnosis not present

## 2019-04-26 DIAGNOSIS — R278 Other lack of coordination: Secondary | ICD-10-CM | POA: Diagnosis not present

## 2019-04-27 DIAGNOSIS — M6281 Muscle weakness (generalized): Secondary | ICD-10-CM | POA: Diagnosis not present

## 2019-04-27 DIAGNOSIS — R2689 Other abnormalities of gait and mobility: Secondary | ICD-10-CM | POA: Diagnosis not present

## 2019-04-28 DIAGNOSIS — R278 Other lack of coordination: Secondary | ICD-10-CM | POA: Diagnosis not present

## 2019-04-28 DIAGNOSIS — M6281 Muscle weakness (generalized): Secondary | ICD-10-CM | POA: Diagnosis not present

## 2019-04-28 DIAGNOSIS — R293 Abnormal posture: Secondary | ICD-10-CM | POA: Diagnosis not present

## 2019-05-01 DIAGNOSIS — R2689 Other abnormalities of gait and mobility: Secondary | ICD-10-CM | POA: Diagnosis not present

## 2019-05-01 DIAGNOSIS — M6281 Muscle weakness (generalized): Secondary | ICD-10-CM | POA: Diagnosis not present

## 2019-05-02 DIAGNOSIS — R2689 Other abnormalities of gait and mobility: Secondary | ICD-10-CM | POA: Diagnosis not present

## 2019-05-02 DIAGNOSIS — M6281 Muscle weakness (generalized): Secondary | ICD-10-CM | POA: Diagnosis not present

## 2019-05-03 DIAGNOSIS — R293 Abnormal posture: Secondary | ICD-10-CM | POA: Diagnosis not present

## 2019-05-03 DIAGNOSIS — R278 Other lack of coordination: Secondary | ICD-10-CM | POA: Diagnosis not present

## 2019-05-03 DIAGNOSIS — M6281 Muscle weakness (generalized): Secondary | ICD-10-CM | POA: Diagnosis not present

## 2019-05-04 DIAGNOSIS — R278 Other lack of coordination: Secondary | ICD-10-CM | POA: Diagnosis not present

## 2019-05-04 DIAGNOSIS — M6281 Muscle weakness (generalized): Secondary | ICD-10-CM | POA: Diagnosis not present

## 2019-05-04 DIAGNOSIS — R293 Abnormal posture: Secondary | ICD-10-CM | POA: Diagnosis not present

## 2019-05-05 DIAGNOSIS — R2689 Other abnormalities of gait and mobility: Secondary | ICD-10-CM | POA: Diagnosis not present

## 2019-05-05 DIAGNOSIS — M6281 Muscle weakness (generalized): Secondary | ICD-10-CM | POA: Diagnosis not present

## 2019-05-08 DIAGNOSIS — R278 Other lack of coordination: Secondary | ICD-10-CM | POA: Diagnosis not present

## 2019-05-08 DIAGNOSIS — M6281 Muscle weakness (generalized): Secondary | ICD-10-CM | POA: Diagnosis not present

## 2019-05-08 DIAGNOSIS — R2689 Other abnormalities of gait and mobility: Secondary | ICD-10-CM | POA: Diagnosis not present

## 2019-05-08 DIAGNOSIS — R293 Abnormal posture: Secondary | ICD-10-CM | POA: Diagnosis not present

## 2019-05-09 DIAGNOSIS — M6281 Muscle weakness (generalized): Secondary | ICD-10-CM | POA: Diagnosis not present

## 2019-05-09 DIAGNOSIS — R293 Abnormal posture: Secondary | ICD-10-CM | POA: Diagnosis not present

## 2019-05-09 DIAGNOSIS — R278 Other lack of coordination: Secondary | ICD-10-CM | POA: Diagnosis not present

## 2019-05-10 DIAGNOSIS — M6281 Muscle weakness (generalized): Secondary | ICD-10-CM | POA: Diagnosis not present

## 2019-05-10 DIAGNOSIS — R2689 Other abnormalities of gait and mobility: Secondary | ICD-10-CM | POA: Diagnosis not present

## 2019-05-11 DIAGNOSIS — R278 Other lack of coordination: Secondary | ICD-10-CM | POA: Diagnosis not present

## 2019-05-11 DIAGNOSIS — R293 Abnormal posture: Secondary | ICD-10-CM | POA: Diagnosis not present

## 2019-05-11 DIAGNOSIS — M6281 Muscle weakness (generalized): Secondary | ICD-10-CM | POA: Diagnosis not present

## 2019-05-12 DIAGNOSIS — M6281 Muscle weakness (generalized): Secondary | ICD-10-CM | POA: Diagnosis not present

## 2019-05-12 DIAGNOSIS — R2689 Other abnormalities of gait and mobility: Secondary | ICD-10-CM | POA: Diagnosis not present

## 2019-05-15 DIAGNOSIS — R278 Other lack of coordination: Secondary | ICD-10-CM | POA: Diagnosis not present

## 2019-05-15 DIAGNOSIS — R293 Abnormal posture: Secondary | ICD-10-CM | POA: Diagnosis not present

## 2019-05-15 DIAGNOSIS — M6281 Muscle weakness (generalized): Secondary | ICD-10-CM | POA: Diagnosis not present

## 2019-05-16 DIAGNOSIS — R2689 Other abnormalities of gait and mobility: Secondary | ICD-10-CM | POA: Diagnosis not present

## 2019-05-16 DIAGNOSIS — M6281 Muscle weakness (generalized): Secondary | ICD-10-CM | POA: Diagnosis not present

## 2019-05-16 DIAGNOSIS — R278 Other lack of coordination: Secondary | ICD-10-CM | POA: Diagnosis not present

## 2019-05-16 DIAGNOSIS — R293 Abnormal posture: Secondary | ICD-10-CM | POA: Diagnosis not present

## 2019-05-17 DIAGNOSIS — R278 Other lack of coordination: Secondary | ICD-10-CM | POA: Diagnosis not present

## 2019-05-17 DIAGNOSIS — R293 Abnormal posture: Secondary | ICD-10-CM | POA: Diagnosis not present

## 2019-05-17 DIAGNOSIS — M6281 Muscle weakness (generalized): Secondary | ICD-10-CM | POA: Diagnosis not present

## 2019-05-18 DIAGNOSIS — M6281 Muscle weakness (generalized): Secondary | ICD-10-CM | POA: Diagnosis not present

## 2019-05-18 DIAGNOSIS — R2689 Other abnormalities of gait and mobility: Secondary | ICD-10-CM | POA: Diagnosis not present

## 2019-05-19 DIAGNOSIS — R278 Other lack of coordination: Secondary | ICD-10-CM | POA: Diagnosis not present

## 2019-05-19 DIAGNOSIS — M6281 Muscle weakness (generalized): Secondary | ICD-10-CM | POA: Diagnosis not present

## 2019-05-19 DIAGNOSIS — R293 Abnormal posture: Secondary | ICD-10-CM | POA: Diagnosis not present

## 2019-05-22 DIAGNOSIS — R2689 Other abnormalities of gait and mobility: Secondary | ICD-10-CM | POA: Diagnosis not present

## 2019-05-22 DIAGNOSIS — M6281 Muscle weakness (generalized): Secondary | ICD-10-CM | POA: Diagnosis not present

## 2019-05-23 DIAGNOSIS — R278 Other lack of coordination: Secondary | ICD-10-CM | POA: Diagnosis not present

## 2019-05-23 DIAGNOSIS — R2689 Other abnormalities of gait and mobility: Secondary | ICD-10-CM | POA: Diagnosis not present

## 2019-05-23 DIAGNOSIS — M6281 Muscle weakness (generalized): Secondary | ICD-10-CM | POA: Diagnosis not present

## 2019-05-23 DIAGNOSIS — R293 Abnormal posture: Secondary | ICD-10-CM | POA: Diagnosis not present

## 2019-05-24 DIAGNOSIS — M6281 Muscle weakness (generalized): Secondary | ICD-10-CM | POA: Diagnosis not present

## 2019-05-24 DIAGNOSIS — R278 Other lack of coordination: Secondary | ICD-10-CM | POA: Diagnosis not present

## 2019-05-24 DIAGNOSIS — R293 Abnormal posture: Secondary | ICD-10-CM | POA: Diagnosis not present

## 2019-05-25 DIAGNOSIS — M6281 Muscle weakness (generalized): Secondary | ICD-10-CM | POA: Diagnosis not present

## 2019-05-25 DIAGNOSIS — R293 Abnormal posture: Secondary | ICD-10-CM | POA: Diagnosis not present

## 2019-05-25 DIAGNOSIS — R278 Other lack of coordination: Secondary | ICD-10-CM | POA: Diagnosis not present

## 2019-05-25 DIAGNOSIS — R2689 Other abnormalities of gait and mobility: Secondary | ICD-10-CM | POA: Diagnosis not present

## 2019-05-29 DIAGNOSIS — R2689 Other abnormalities of gait and mobility: Secondary | ICD-10-CM | POA: Diagnosis not present

## 2019-05-29 DIAGNOSIS — M6281 Muscle weakness (generalized): Secondary | ICD-10-CM | POA: Diagnosis not present

## 2019-05-31 DIAGNOSIS — M6281 Muscle weakness (generalized): Secondary | ICD-10-CM | POA: Diagnosis not present

## 2019-05-31 DIAGNOSIS — R2689 Other abnormalities of gait and mobility: Secondary | ICD-10-CM | POA: Diagnosis not present

## 2019-05-31 DIAGNOSIS — R278 Other lack of coordination: Secondary | ICD-10-CM | POA: Diagnosis not present

## 2019-05-31 DIAGNOSIS — R293 Abnormal posture: Secondary | ICD-10-CM | POA: Diagnosis not present

## 2019-06-01 DIAGNOSIS — R293 Abnormal posture: Secondary | ICD-10-CM | POA: Diagnosis not present

## 2019-06-01 DIAGNOSIS — R278 Other lack of coordination: Secondary | ICD-10-CM | POA: Diagnosis not present

## 2019-06-01 DIAGNOSIS — M6281 Muscle weakness (generalized): Secondary | ICD-10-CM | POA: Diagnosis not present

## 2019-06-02 DIAGNOSIS — R2689 Other abnormalities of gait and mobility: Secondary | ICD-10-CM | POA: Diagnosis not present

## 2019-06-02 DIAGNOSIS — R278 Other lack of coordination: Secondary | ICD-10-CM | POA: Diagnosis not present

## 2019-06-02 DIAGNOSIS — M6281 Muscle weakness (generalized): Secondary | ICD-10-CM | POA: Diagnosis not present

## 2019-06-02 DIAGNOSIS — R293 Abnormal posture: Secondary | ICD-10-CM | POA: Diagnosis not present

## 2019-06-05 DIAGNOSIS — M6281 Muscle weakness (generalized): Secondary | ICD-10-CM | POA: Diagnosis not present

## 2019-06-05 DIAGNOSIS — R2689 Other abnormalities of gait and mobility: Secondary | ICD-10-CM | POA: Diagnosis not present

## 2019-06-06 DIAGNOSIS — R278 Other lack of coordination: Secondary | ICD-10-CM | POA: Diagnosis not present

## 2019-06-06 DIAGNOSIS — M6281 Muscle weakness (generalized): Secondary | ICD-10-CM | POA: Diagnosis not present

## 2019-06-06 DIAGNOSIS — R2689 Other abnormalities of gait and mobility: Secondary | ICD-10-CM | POA: Diagnosis not present

## 2019-06-06 DIAGNOSIS — R293 Abnormal posture: Secondary | ICD-10-CM | POA: Diagnosis not present

## 2019-06-07 DIAGNOSIS — M6281 Muscle weakness (generalized): Secondary | ICD-10-CM | POA: Diagnosis not present

## 2019-06-07 DIAGNOSIS — R293 Abnormal posture: Secondary | ICD-10-CM | POA: Diagnosis not present

## 2019-06-07 DIAGNOSIS — R278 Other lack of coordination: Secondary | ICD-10-CM | POA: Diagnosis not present

## 2019-06-08 DIAGNOSIS — M6281 Muscle weakness (generalized): Secondary | ICD-10-CM | POA: Diagnosis not present

## 2019-06-08 DIAGNOSIS — R2689 Other abnormalities of gait and mobility: Secondary | ICD-10-CM | POA: Diagnosis not present

## 2019-06-09 DIAGNOSIS — R278 Other lack of coordination: Secondary | ICD-10-CM | POA: Diagnosis not present

## 2019-06-09 DIAGNOSIS — R2689 Other abnormalities of gait and mobility: Secondary | ICD-10-CM | POA: Diagnosis not present

## 2019-06-09 DIAGNOSIS — R293 Abnormal posture: Secondary | ICD-10-CM | POA: Diagnosis not present

## 2019-06-09 DIAGNOSIS — M6281 Muscle weakness (generalized): Secondary | ICD-10-CM | POA: Diagnosis not present

## 2019-06-13 DIAGNOSIS — M6281 Muscle weakness (generalized): Secondary | ICD-10-CM | POA: Diagnosis not present

## 2019-06-13 DIAGNOSIS — R293 Abnormal posture: Secondary | ICD-10-CM | POA: Diagnosis not present

## 2019-06-13 DIAGNOSIS — R278 Other lack of coordination: Secondary | ICD-10-CM | POA: Diagnosis not present

## 2019-06-13 DIAGNOSIS — R2689 Other abnormalities of gait and mobility: Secondary | ICD-10-CM | POA: Diagnosis not present

## 2019-06-14 DIAGNOSIS — R278 Other lack of coordination: Secondary | ICD-10-CM | POA: Diagnosis not present

## 2019-06-14 DIAGNOSIS — R2689 Other abnormalities of gait and mobility: Secondary | ICD-10-CM | POA: Diagnosis not present

## 2019-06-14 DIAGNOSIS — R293 Abnormal posture: Secondary | ICD-10-CM | POA: Diagnosis not present

## 2019-06-14 DIAGNOSIS — M6281 Muscle weakness (generalized): Secondary | ICD-10-CM | POA: Diagnosis not present

## 2019-06-15 DIAGNOSIS — R278 Other lack of coordination: Secondary | ICD-10-CM | POA: Diagnosis not present

## 2019-06-15 DIAGNOSIS — R293 Abnormal posture: Secondary | ICD-10-CM | POA: Diagnosis not present

## 2019-06-15 DIAGNOSIS — M6281 Muscle weakness (generalized): Secondary | ICD-10-CM | POA: Diagnosis not present

## 2019-06-19 DIAGNOSIS — R2689 Other abnormalities of gait and mobility: Secondary | ICD-10-CM | POA: Diagnosis not present

## 2019-06-19 DIAGNOSIS — M6281 Muscle weakness (generalized): Secondary | ICD-10-CM | POA: Diagnosis not present

## 2019-06-20 DIAGNOSIS — M6281 Muscle weakness (generalized): Secondary | ICD-10-CM | POA: Diagnosis not present

## 2019-06-20 DIAGNOSIS — R2689 Other abnormalities of gait and mobility: Secondary | ICD-10-CM | POA: Diagnosis not present

## 2019-06-20 DIAGNOSIS — R293 Abnormal posture: Secondary | ICD-10-CM | POA: Diagnosis not present

## 2019-06-20 DIAGNOSIS — R278 Other lack of coordination: Secondary | ICD-10-CM | POA: Diagnosis not present

## 2019-06-21 DIAGNOSIS — R2689 Other abnormalities of gait and mobility: Secondary | ICD-10-CM | POA: Diagnosis not present

## 2019-06-21 DIAGNOSIS — R278 Other lack of coordination: Secondary | ICD-10-CM | POA: Diagnosis not present

## 2019-06-21 DIAGNOSIS — R293 Abnormal posture: Secondary | ICD-10-CM | POA: Diagnosis not present

## 2019-06-21 DIAGNOSIS — M6281 Muscle weakness (generalized): Secondary | ICD-10-CM | POA: Diagnosis not present

## 2019-06-22 DIAGNOSIS — R293 Abnormal posture: Secondary | ICD-10-CM | POA: Diagnosis not present

## 2019-06-22 DIAGNOSIS — R278 Other lack of coordination: Secondary | ICD-10-CM | POA: Diagnosis not present

## 2019-06-22 DIAGNOSIS — M6281 Muscle weakness (generalized): Secondary | ICD-10-CM | POA: Diagnosis not present

## 2019-06-26 DIAGNOSIS — M6281 Muscle weakness (generalized): Secondary | ICD-10-CM | POA: Diagnosis not present

## 2019-06-26 DIAGNOSIS — R2689 Other abnormalities of gait and mobility: Secondary | ICD-10-CM | POA: Diagnosis not present

## 2019-06-26 DIAGNOSIS — I10 Essential (primary) hypertension: Secondary | ICD-10-CM | POA: Diagnosis not present

## 2019-06-26 DIAGNOSIS — F0390 Unspecified dementia without behavioral disturbance: Secondary | ICD-10-CM | POA: Diagnosis not present

## 2019-06-26 DIAGNOSIS — E213 Hyperparathyroidism, unspecified: Secondary | ICD-10-CM | POA: Diagnosis not present

## 2019-06-26 DIAGNOSIS — E119 Type 2 diabetes mellitus without complications: Secondary | ICD-10-CM | POA: Diagnosis not present

## 2019-06-27 DIAGNOSIS — M6281 Muscle weakness (generalized): Secondary | ICD-10-CM | POA: Diagnosis not present

## 2019-06-27 DIAGNOSIS — R278 Other lack of coordination: Secondary | ICD-10-CM | POA: Diagnosis not present

## 2019-06-27 DIAGNOSIS — R293 Abnormal posture: Secondary | ICD-10-CM | POA: Diagnosis not present

## 2019-06-27 DIAGNOSIS — R2689 Other abnormalities of gait and mobility: Secondary | ICD-10-CM | POA: Diagnosis not present

## 2019-06-28 DIAGNOSIS — R293 Abnormal posture: Secondary | ICD-10-CM | POA: Diagnosis not present

## 2019-06-28 DIAGNOSIS — R2689 Other abnormalities of gait and mobility: Secondary | ICD-10-CM | POA: Diagnosis not present

## 2019-06-28 DIAGNOSIS — R278 Other lack of coordination: Secondary | ICD-10-CM | POA: Diagnosis not present

## 2019-06-28 DIAGNOSIS — M6281 Muscle weakness (generalized): Secondary | ICD-10-CM | POA: Diagnosis not present

## 2019-06-29 DIAGNOSIS — R278 Other lack of coordination: Secondary | ICD-10-CM | POA: Diagnosis not present

## 2019-06-29 DIAGNOSIS — M6281 Muscle weakness (generalized): Secondary | ICD-10-CM | POA: Diagnosis not present

## 2019-06-29 DIAGNOSIS — R293 Abnormal posture: Secondary | ICD-10-CM | POA: Diagnosis not present

## 2019-07-03 DIAGNOSIS — M6281 Muscle weakness (generalized): Secondary | ICD-10-CM | POA: Diagnosis not present

## 2019-07-03 DIAGNOSIS — R2689 Other abnormalities of gait and mobility: Secondary | ICD-10-CM | POA: Diagnosis not present

## 2019-07-04 DIAGNOSIS — R278 Other lack of coordination: Secondary | ICD-10-CM | POA: Diagnosis not present

## 2019-07-04 DIAGNOSIS — R2689 Other abnormalities of gait and mobility: Secondary | ICD-10-CM | POA: Diagnosis not present

## 2019-07-04 DIAGNOSIS — R293 Abnormal posture: Secondary | ICD-10-CM | POA: Diagnosis not present

## 2019-07-04 DIAGNOSIS — M6281 Muscle weakness (generalized): Secondary | ICD-10-CM | POA: Diagnosis not present

## 2019-07-05 DIAGNOSIS — M6281 Muscle weakness (generalized): Secondary | ICD-10-CM | POA: Diagnosis not present

## 2019-07-05 DIAGNOSIS — R278 Other lack of coordination: Secondary | ICD-10-CM | POA: Diagnosis not present

## 2019-07-05 DIAGNOSIS — R293 Abnormal posture: Secondary | ICD-10-CM | POA: Diagnosis not present

## 2019-07-05 DIAGNOSIS — R2689 Other abnormalities of gait and mobility: Secondary | ICD-10-CM | POA: Diagnosis not present

## 2019-07-06 DIAGNOSIS — E119 Type 2 diabetes mellitus without complications: Secondary | ICD-10-CM | POA: Diagnosis not present

## 2019-07-06 DIAGNOSIS — R278 Other lack of coordination: Secondary | ICD-10-CM | POA: Diagnosis not present

## 2019-07-06 DIAGNOSIS — E213 Hyperparathyroidism, unspecified: Secondary | ICD-10-CM | POA: Diagnosis not present

## 2019-07-06 DIAGNOSIS — D649 Anemia, unspecified: Secondary | ICD-10-CM | POA: Diagnosis not present

## 2019-07-06 DIAGNOSIS — M6281 Muscle weakness (generalized): Secondary | ICD-10-CM | POA: Diagnosis not present

## 2019-07-06 DIAGNOSIS — I10 Essential (primary) hypertension: Secondary | ICD-10-CM | POA: Diagnosis not present

## 2019-07-06 DIAGNOSIS — E039 Hypothyroidism, unspecified: Secondary | ICD-10-CM | POA: Diagnosis not present

## 2019-07-06 DIAGNOSIS — R293 Abnormal posture: Secondary | ICD-10-CM | POA: Diagnosis not present

## 2019-07-06 DIAGNOSIS — R5383 Other fatigue: Secondary | ICD-10-CM | POA: Diagnosis not present

## 2019-07-10 DIAGNOSIS — R2689 Other abnormalities of gait and mobility: Secondary | ICD-10-CM | POA: Diagnosis not present

## 2019-07-10 DIAGNOSIS — M6281 Muscle weakness (generalized): Secondary | ICD-10-CM | POA: Diagnosis not present

## 2019-07-11 DIAGNOSIS — R278 Other lack of coordination: Secondary | ICD-10-CM | POA: Diagnosis not present

## 2019-07-11 DIAGNOSIS — R293 Abnormal posture: Secondary | ICD-10-CM | POA: Diagnosis not present

## 2019-07-11 DIAGNOSIS — R2689 Other abnormalities of gait and mobility: Secondary | ICD-10-CM | POA: Diagnosis not present

## 2019-07-11 DIAGNOSIS — M6281 Muscle weakness (generalized): Secondary | ICD-10-CM | POA: Diagnosis not present

## 2019-07-12 DIAGNOSIS — R2689 Other abnormalities of gait and mobility: Secondary | ICD-10-CM | POA: Diagnosis not present

## 2019-07-12 DIAGNOSIS — R293 Abnormal posture: Secondary | ICD-10-CM | POA: Diagnosis not present

## 2019-07-12 DIAGNOSIS — R278 Other lack of coordination: Secondary | ICD-10-CM | POA: Diagnosis not present

## 2019-07-12 DIAGNOSIS — M6281 Muscle weakness (generalized): Secondary | ICD-10-CM | POA: Diagnosis not present

## 2019-07-13 DIAGNOSIS — M6281 Muscle weakness (generalized): Secondary | ICD-10-CM | POA: Diagnosis not present

## 2019-07-13 DIAGNOSIS — R278 Other lack of coordination: Secondary | ICD-10-CM | POA: Diagnosis not present

## 2019-07-13 DIAGNOSIS — R293 Abnormal posture: Secondary | ICD-10-CM | POA: Diagnosis not present

## 2019-07-17 DIAGNOSIS — M6281 Muscle weakness (generalized): Secondary | ICD-10-CM | POA: Diagnosis not present

## 2019-07-17 DIAGNOSIS — R2689 Other abnormalities of gait and mobility: Secondary | ICD-10-CM | POA: Diagnosis not present

## 2019-07-18 DIAGNOSIS — R2689 Other abnormalities of gait and mobility: Secondary | ICD-10-CM | POA: Diagnosis not present

## 2019-07-18 DIAGNOSIS — M6281 Muscle weakness (generalized): Secondary | ICD-10-CM | POA: Diagnosis not present

## 2019-07-19 DIAGNOSIS — M6281 Muscle weakness (generalized): Secondary | ICD-10-CM | POA: Diagnosis not present

## 2019-07-19 DIAGNOSIS — R293 Abnormal posture: Secondary | ICD-10-CM | POA: Diagnosis not present

## 2019-07-19 DIAGNOSIS — R278 Other lack of coordination: Secondary | ICD-10-CM | POA: Diagnosis not present

## 2019-07-20 DIAGNOSIS — R278 Other lack of coordination: Secondary | ICD-10-CM | POA: Diagnosis not present

## 2019-07-20 DIAGNOSIS — R293 Abnormal posture: Secondary | ICD-10-CM | POA: Diagnosis not present

## 2019-07-20 DIAGNOSIS — M6281 Muscle weakness (generalized): Secondary | ICD-10-CM | POA: Diagnosis not present

## 2019-07-21 DIAGNOSIS — M6281 Muscle weakness (generalized): Secondary | ICD-10-CM | POA: Diagnosis not present

## 2019-07-21 DIAGNOSIS — R2689 Other abnormalities of gait and mobility: Secondary | ICD-10-CM | POA: Diagnosis not present

## 2019-07-24 DIAGNOSIS — R293 Abnormal posture: Secondary | ICD-10-CM | POA: Diagnosis not present

## 2019-07-24 DIAGNOSIS — M6281 Muscle weakness (generalized): Secondary | ICD-10-CM | POA: Diagnosis not present

## 2019-07-24 DIAGNOSIS — R2689 Other abnormalities of gait and mobility: Secondary | ICD-10-CM | POA: Diagnosis not present

## 2019-07-24 DIAGNOSIS — R278 Other lack of coordination: Secondary | ICD-10-CM | POA: Diagnosis not present

## 2019-07-25 DIAGNOSIS — M6281 Muscle weakness (generalized): Secondary | ICD-10-CM | POA: Diagnosis not present

## 2019-07-25 DIAGNOSIS — R2689 Other abnormalities of gait and mobility: Secondary | ICD-10-CM | POA: Diagnosis not present

## 2019-07-25 DIAGNOSIS — R293 Abnormal posture: Secondary | ICD-10-CM | POA: Diagnosis not present

## 2019-07-25 DIAGNOSIS — R278 Other lack of coordination: Secondary | ICD-10-CM | POA: Diagnosis not present

## 2019-07-26 DIAGNOSIS — R278 Other lack of coordination: Secondary | ICD-10-CM | POA: Diagnosis not present

## 2019-07-26 DIAGNOSIS — R293 Abnormal posture: Secondary | ICD-10-CM | POA: Diagnosis not present

## 2019-07-26 DIAGNOSIS — M6281 Muscle weakness (generalized): Secondary | ICD-10-CM | POA: Diagnosis not present

## 2019-07-26 DIAGNOSIS — R2689 Other abnormalities of gait and mobility: Secondary | ICD-10-CM | POA: Diagnosis not present

## 2019-07-31 DIAGNOSIS — R2689 Other abnormalities of gait and mobility: Secondary | ICD-10-CM | POA: Diagnosis not present

## 2019-07-31 DIAGNOSIS — R278 Other lack of coordination: Secondary | ICD-10-CM | POA: Diagnosis not present

## 2019-07-31 DIAGNOSIS — M6281 Muscle weakness (generalized): Secondary | ICD-10-CM | POA: Diagnosis not present

## 2019-07-31 DIAGNOSIS — R293 Abnormal posture: Secondary | ICD-10-CM | POA: Diagnosis not present

## 2019-08-01 DIAGNOSIS — R278 Other lack of coordination: Secondary | ICD-10-CM | POA: Diagnosis not present

## 2019-08-01 DIAGNOSIS — R293 Abnormal posture: Secondary | ICD-10-CM | POA: Diagnosis not present

## 2019-08-01 DIAGNOSIS — R2689 Other abnormalities of gait and mobility: Secondary | ICD-10-CM | POA: Diagnosis not present

## 2019-08-01 DIAGNOSIS — M6281 Muscle weakness (generalized): Secondary | ICD-10-CM | POA: Diagnosis not present

## 2019-08-03 DIAGNOSIS — R278 Other lack of coordination: Secondary | ICD-10-CM | POA: Diagnosis not present

## 2019-08-03 DIAGNOSIS — R293 Abnormal posture: Secondary | ICD-10-CM | POA: Diagnosis not present

## 2019-08-03 DIAGNOSIS — R2689 Other abnormalities of gait and mobility: Secondary | ICD-10-CM | POA: Diagnosis not present

## 2019-08-03 DIAGNOSIS — M6281 Muscle weakness (generalized): Secondary | ICD-10-CM | POA: Diagnosis not present

## 2019-08-04 DIAGNOSIS — R293 Abnormal posture: Secondary | ICD-10-CM | POA: Diagnosis not present

## 2019-08-04 DIAGNOSIS — R278 Other lack of coordination: Secondary | ICD-10-CM | POA: Diagnosis not present

## 2019-08-04 DIAGNOSIS — R2689 Other abnormalities of gait and mobility: Secondary | ICD-10-CM | POA: Diagnosis not present

## 2019-08-04 DIAGNOSIS — M6281 Muscle weakness (generalized): Secondary | ICD-10-CM | POA: Diagnosis not present

## 2019-08-08 DIAGNOSIS — R2689 Other abnormalities of gait and mobility: Secondary | ICD-10-CM | POA: Diagnosis not present

## 2019-08-08 DIAGNOSIS — M6281 Muscle weakness (generalized): Secondary | ICD-10-CM | POA: Diagnosis not present

## 2019-08-08 DIAGNOSIS — R278 Other lack of coordination: Secondary | ICD-10-CM | POA: Diagnosis not present

## 2019-08-08 DIAGNOSIS — R293 Abnormal posture: Secondary | ICD-10-CM | POA: Diagnosis not present

## 2019-08-10 DIAGNOSIS — R2689 Other abnormalities of gait and mobility: Secondary | ICD-10-CM | POA: Diagnosis not present

## 2019-08-10 DIAGNOSIS — M6281 Muscle weakness (generalized): Secondary | ICD-10-CM | POA: Diagnosis not present

## 2019-09-12 DIAGNOSIS — Z20828 Contact with and (suspected) exposure to other viral communicable diseases: Secondary | ICD-10-CM | POA: Diagnosis not present

## 2019-09-19 DIAGNOSIS — E213 Hyperparathyroidism, unspecified: Secondary | ICD-10-CM | POA: Diagnosis not present

## 2019-09-19 DIAGNOSIS — K219 Gastro-esophageal reflux disease without esophagitis: Secondary | ICD-10-CM | POA: Diagnosis not present

## 2019-09-19 DIAGNOSIS — F0390 Unspecified dementia without behavioral disturbance: Secondary | ICD-10-CM | POA: Diagnosis not present

## 2019-09-20 DIAGNOSIS — Z20828 Contact with and (suspected) exposure to other viral communicable diseases: Secondary | ICD-10-CM | POA: Diagnosis not present

## 2019-09-30 DIAGNOSIS — F0390 Unspecified dementia without behavioral disturbance: Secondary | ICD-10-CM | POA: Diagnosis not present

## 2019-09-30 DIAGNOSIS — E213 Hyperparathyroidism, unspecified: Secondary | ICD-10-CM | POA: Diagnosis not present

## 2019-09-30 DIAGNOSIS — Z9181 History of falling: Secondary | ICD-10-CM | POA: Diagnosis not present

## 2019-09-30 DIAGNOSIS — J45909 Unspecified asthma, uncomplicated: Secondary | ICD-10-CM | POA: Diagnosis not present

## 2019-09-30 DIAGNOSIS — G4733 Obstructive sleep apnea (adult) (pediatric): Secondary | ICD-10-CM | POA: Diagnosis not present

## 2019-09-30 DIAGNOSIS — I10 Essential (primary) hypertension: Secondary | ICD-10-CM | POA: Diagnosis not present

## 2019-09-30 DIAGNOSIS — E785 Hyperlipidemia, unspecified: Secondary | ICD-10-CM | POA: Diagnosis not present

## 2019-09-30 DIAGNOSIS — E119 Type 2 diabetes mellitus without complications: Secondary | ICD-10-CM | POA: Diagnosis not present

## 2019-09-30 DIAGNOSIS — G894 Chronic pain syndrome: Secondary | ICD-10-CM | POA: Diagnosis not present

## 2019-09-30 DIAGNOSIS — U071 COVID-19: Secondary | ICD-10-CM | POA: Diagnosis not present

## 2019-09-30 DIAGNOSIS — Z7984 Long term (current) use of oral hypoglycemic drugs: Secondary | ICD-10-CM | POA: Diagnosis not present

## 2019-10-09 DIAGNOSIS — F0390 Unspecified dementia without behavioral disturbance: Secondary | ICD-10-CM | POA: Diagnosis not present

## 2019-10-09 DIAGNOSIS — E119 Type 2 diabetes mellitus without complications: Secondary | ICD-10-CM | POA: Diagnosis not present

## 2019-10-09 DIAGNOSIS — E213 Hyperparathyroidism, unspecified: Secondary | ICD-10-CM | POA: Diagnosis not present

## 2019-10-09 DIAGNOSIS — U071 COVID-19: Secondary | ICD-10-CM | POA: Diagnosis not present

## 2019-10-24 DIAGNOSIS — G4733 Obstructive sleep apnea (adult) (pediatric): Secondary | ICD-10-CM | POA: Diagnosis not present

## 2019-10-24 DIAGNOSIS — I10 Essential (primary) hypertension: Secondary | ICD-10-CM | POA: Diagnosis not present

## 2019-10-24 DIAGNOSIS — E785 Hyperlipidemia, unspecified: Secondary | ICD-10-CM | POA: Diagnosis not present

## 2019-10-24 DIAGNOSIS — E213 Hyperparathyroidism, unspecified: Secondary | ICD-10-CM | POA: Diagnosis not present

## 2019-10-24 DIAGNOSIS — U071 COVID-19: Secondary | ICD-10-CM | POA: Diagnosis not present

## 2019-10-24 DIAGNOSIS — G894 Chronic pain syndrome: Secondary | ICD-10-CM | POA: Diagnosis not present

## 2019-10-24 DIAGNOSIS — Z7984 Long term (current) use of oral hypoglycemic drugs: Secondary | ICD-10-CM | POA: Diagnosis not present

## 2019-10-24 DIAGNOSIS — F0390 Unspecified dementia without behavioral disturbance: Secondary | ICD-10-CM | POA: Diagnosis not present

## 2019-10-24 DIAGNOSIS — J45909 Unspecified asthma, uncomplicated: Secondary | ICD-10-CM | POA: Diagnosis not present

## 2019-10-24 DIAGNOSIS — E119 Type 2 diabetes mellitus without complications: Secondary | ICD-10-CM | POA: Diagnosis not present

## 2019-10-24 DIAGNOSIS — Z9181 History of falling: Secondary | ICD-10-CM | POA: Diagnosis not present

## 2019-11-06 DIAGNOSIS — E119 Type 2 diabetes mellitus without complications: Secondary | ICD-10-CM | POA: Diagnosis not present

## 2019-11-06 DIAGNOSIS — Z207 Contact with and (suspected) exposure to pediculosis, acariasis and other infestations: Secondary | ICD-10-CM | POA: Diagnosis not present

## 2019-11-06 DIAGNOSIS — L539 Erythematous condition, unspecified: Secondary | ICD-10-CM | POA: Diagnosis not present

## 2019-11-06 DIAGNOSIS — E213 Hyperparathyroidism, unspecified: Secondary | ICD-10-CM | POA: Diagnosis not present

## 2019-11-06 DIAGNOSIS — F0390 Unspecified dementia without behavioral disturbance: Secondary | ICD-10-CM | POA: Diagnosis not present

## 2019-11-11 DIAGNOSIS — G894 Chronic pain syndrome: Secondary | ICD-10-CM | POA: Diagnosis not present

## 2019-11-11 DIAGNOSIS — E785 Hyperlipidemia, unspecified: Secondary | ICD-10-CM | POA: Diagnosis not present

## 2019-11-11 DIAGNOSIS — U071 COVID-19: Secondary | ICD-10-CM | POA: Diagnosis not present

## 2019-11-11 DIAGNOSIS — Z9181 History of falling: Secondary | ICD-10-CM | POA: Diagnosis not present

## 2019-11-11 DIAGNOSIS — E213 Hyperparathyroidism, unspecified: Secondary | ICD-10-CM | POA: Diagnosis not present

## 2019-11-11 DIAGNOSIS — J45909 Unspecified asthma, uncomplicated: Secondary | ICD-10-CM | POA: Diagnosis not present

## 2019-11-11 DIAGNOSIS — E119 Type 2 diabetes mellitus without complications: Secondary | ICD-10-CM | POA: Diagnosis not present

## 2019-11-11 DIAGNOSIS — G4733 Obstructive sleep apnea (adult) (pediatric): Secondary | ICD-10-CM | POA: Diagnosis not present

## 2019-11-11 DIAGNOSIS — Z7984 Long term (current) use of oral hypoglycemic drugs: Secondary | ICD-10-CM | POA: Diagnosis not present

## 2019-11-11 DIAGNOSIS — I10 Essential (primary) hypertension: Secondary | ICD-10-CM | POA: Diagnosis not present

## 2019-11-11 DIAGNOSIS — F0390 Unspecified dementia without behavioral disturbance: Secondary | ICD-10-CM | POA: Diagnosis not present

## 2019-11-20 DIAGNOSIS — L539 Erythematous condition, unspecified: Secondary | ICD-10-CM | POA: Diagnosis not present

## 2019-11-20 DIAGNOSIS — E213 Hyperparathyroidism, unspecified: Secondary | ICD-10-CM | POA: Diagnosis not present

## 2019-11-20 DIAGNOSIS — E119 Type 2 diabetes mellitus without complications: Secondary | ICD-10-CM | POA: Diagnosis not present

## 2019-11-20 DIAGNOSIS — F0390 Unspecified dementia without behavioral disturbance: Secondary | ICD-10-CM | POA: Diagnosis not present

## 2019-11-21 DIAGNOSIS — Z9181 History of falling: Secondary | ICD-10-CM | POA: Diagnosis not present

## 2019-11-21 DIAGNOSIS — E785 Hyperlipidemia, unspecified: Secondary | ICD-10-CM | POA: Diagnosis not present

## 2019-11-21 DIAGNOSIS — J45909 Unspecified asthma, uncomplicated: Secondary | ICD-10-CM | POA: Diagnosis not present

## 2019-11-21 DIAGNOSIS — E213 Hyperparathyroidism, unspecified: Secondary | ICD-10-CM | POA: Diagnosis not present

## 2019-11-21 DIAGNOSIS — F0390 Unspecified dementia without behavioral disturbance: Secondary | ICD-10-CM | POA: Diagnosis not present

## 2019-11-21 DIAGNOSIS — G894 Chronic pain syndrome: Secondary | ICD-10-CM | POA: Diagnosis not present

## 2019-11-21 DIAGNOSIS — E119 Type 2 diabetes mellitus without complications: Secondary | ICD-10-CM | POA: Diagnosis not present

## 2019-11-21 DIAGNOSIS — U071 COVID-19: Secondary | ICD-10-CM | POA: Diagnosis not present

## 2019-11-21 DIAGNOSIS — G4733 Obstructive sleep apnea (adult) (pediatric): Secondary | ICD-10-CM | POA: Diagnosis not present

## 2019-11-21 DIAGNOSIS — I10 Essential (primary) hypertension: Secondary | ICD-10-CM | POA: Diagnosis not present

## 2019-11-21 DIAGNOSIS — Z7984 Long term (current) use of oral hypoglycemic drugs: Secondary | ICD-10-CM | POA: Diagnosis not present

## 2019-11-24 DIAGNOSIS — R319 Hematuria, unspecified: Secondary | ICD-10-CM | POA: Diagnosis not present

## 2019-11-24 DIAGNOSIS — E213 Hyperparathyroidism, unspecified: Secondary | ICD-10-CM | POA: Diagnosis not present

## 2019-11-24 DIAGNOSIS — E119 Type 2 diabetes mellitus without complications: Secondary | ICD-10-CM | POA: Diagnosis not present

## 2019-11-24 DIAGNOSIS — I10 Essential (primary) hypertension: Secondary | ICD-10-CM | POA: Diagnosis not present

## 2019-11-24 DIAGNOSIS — N390 Urinary tract infection, site not specified: Secondary | ICD-10-CM | POA: Diagnosis not present

## 2019-12-04 ENCOUNTER — Other Ambulatory Visit: Payer: Self-pay | Admitting: Internal Medicine

## 2019-12-04 DIAGNOSIS — Z1231 Encounter for screening mammogram for malignant neoplasm of breast: Secondary | ICD-10-CM

## 2019-12-11 DIAGNOSIS — L539 Erythematous condition, unspecified: Secondary | ICD-10-CM | POA: Diagnosis not present

## 2019-12-11 DIAGNOSIS — E213 Hyperparathyroidism, unspecified: Secondary | ICD-10-CM | POA: Diagnosis not present

## 2019-12-11 DIAGNOSIS — F0390 Unspecified dementia without behavioral disturbance: Secondary | ICD-10-CM | POA: Diagnosis not present

## 2019-12-11 DIAGNOSIS — E119 Type 2 diabetes mellitus without complications: Secondary | ICD-10-CM | POA: Diagnosis not present

## 2019-12-13 DIAGNOSIS — E119 Type 2 diabetes mellitus without complications: Secondary | ICD-10-CM | POA: Diagnosis not present

## 2019-12-19 ENCOUNTER — Other Ambulatory Visit: Payer: Self-pay

## 2019-12-19 ENCOUNTER — Ambulatory Visit
Admission: RE | Admit: 2019-12-19 | Discharge: 2019-12-19 | Disposition: A | Discharge status: Home / Self Care | Payer: PPO | Source: Ambulatory Visit | Attending: Internal Medicine | Admitting: Internal Medicine

## 2019-12-19 DIAGNOSIS — F0390 Unspecified dementia without behavioral disturbance: Secondary | ICD-10-CM | POA: Diagnosis not present

## 2019-12-19 DIAGNOSIS — Z1231 Encounter for screening mammogram for malignant neoplasm of breast: Secondary | ICD-10-CM

## 2019-12-19 DIAGNOSIS — E119 Type 2 diabetes mellitus without complications: Secondary | ICD-10-CM | POA: Diagnosis not present

## 2019-12-19 DIAGNOSIS — L539 Erythematous condition, unspecified: Secondary | ICD-10-CM | POA: Diagnosis not present

## 2019-12-19 DIAGNOSIS — E213 Hyperparathyroidism, unspecified: Secondary | ICD-10-CM | POA: Diagnosis not present

## 2020-01-10 DIAGNOSIS — E119 Type 2 diabetes mellitus without complications: Secondary | ICD-10-CM | POA: Diagnosis not present

## 2020-01-22 DIAGNOSIS — F0390 Unspecified dementia without behavioral disturbance: Secondary | ICD-10-CM | POA: Diagnosis not present

## 2020-01-22 DIAGNOSIS — M170 Bilateral primary osteoarthritis of knee: Secondary | ICD-10-CM | POA: Diagnosis not present

## 2020-01-22 DIAGNOSIS — R2681 Unsteadiness on feet: Secondary | ICD-10-CM | POA: Diagnosis not present

## 2020-01-22 DIAGNOSIS — E213 Hyperparathyroidism, unspecified: Secondary | ICD-10-CM | POA: Diagnosis not present

## 2020-01-25 DIAGNOSIS — R278 Other lack of coordination: Secondary | ICD-10-CM | POA: Diagnosis not present

## 2020-01-25 DIAGNOSIS — M6281 Muscle weakness (generalized): Secondary | ICD-10-CM | POA: Diagnosis not present

## 2020-01-30 DIAGNOSIS — R278 Other lack of coordination: Secondary | ICD-10-CM | POA: Diagnosis not present

## 2020-01-30 DIAGNOSIS — M6281 Muscle weakness (generalized): Secondary | ICD-10-CM | POA: Diagnosis not present

## 2020-02-01 DIAGNOSIS — R278 Other lack of coordination: Secondary | ICD-10-CM | POA: Diagnosis not present

## 2020-02-01 DIAGNOSIS — M6281 Muscle weakness (generalized): Secondary | ICD-10-CM | POA: Diagnosis not present

## 2020-02-05 DIAGNOSIS — R278 Other lack of coordination: Secondary | ICD-10-CM | POA: Diagnosis not present

## 2020-02-05 DIAGNOSIS — R2689 Other abnormalities of gait and mobility: Secondary | ICD-10-CM | POA: Diagnosis not present

## 2020-02-05 DIAGNOSIS — R2681 Unsteadiness on feet: Secondary | ICD-10-CM | POA: Diagnosis not present

## 2020-02-05 DIAGNOSIS — M6281 Muscle weakness (generalized): Secondary | ICD-10-CM | POA: Diagnosis not present

## 2020-02-08 DIAGNOSIS — R278 Other lack of coordination: Secondary | ICD-10-CM | POA: Diagnosis not present

## 2020-02-08 DIAGNOSIS — M6281 Muscle weakness (generalized): Secondary | ICD-10-CM | POA: Diagnosis not present

## 2020-02-09 DIAGNOSIS — R2689 Other abnormalities of gait and mobility: Secondary | ICD-10-CM | POA: Diagnosis not present

## 2020-02-09 DIAGNOSIS — R2681 Unsteadiness on feet: Secondary | ICD-10-CM | POA: Diagnosis not present

## 2020-02-12 DIAGNOSIS — R2689 Other abnormalities of gait and mobility: Secondary | ICD-10-CM | POA: Diagnosis not present

## 2020-02-12 DIAGNOSIS — R278 Other lack of coordination: Secondary | ICD-10-CM | POA: Diagnosis not present

## 2020-02-12 DIAGNOSIS — R2681 Unsteadiness on feet: Secondary | ICD-10-CM | POA: Diagnosis not present

## 2020-02-12 DIAGNOSIS — M6281 Muscle weakness (generalized): Secondary | ICD-10-CM | POA: Diagnosis not present

## 2020-02-13 DIAGNOSIS — R2681 Unsteadiness on feet: Secondary | ICD-10-CM | POA: Diagnosis not present

## 2020-02-13 DIAGNOSIS — R2689 Other abnormalities of gait and mobility: Secondary | ICD-10-CM | POA: Diagnosis not present

## 2020-02-15 DIAGNOSIS — R2689 Other abnormalities of gait and mobility: Secondary | ICD-10-CM | POA: Diagnosis not present

## 2020-02-15 DIAGNOSIS — R2681 Unsteadiness on feet: Secondary | ICD-10-CM | POA: Diagnosis not present

## 2020-02-16 DIAGNOSIS — M6281 Muscle weakness (generalized): Secondary | ICD-10-CM | POA: Diagnosis not present

## 2020-02-16 DIAGNOSIS — R278 Other lack of coordination: Secondary | ICD-10-CM | POA: Diagnosis not present

## 2020-02-19 DIAGNOSIS — R2681 Unsteadiness on feet: Secondary | ICD-10-CM | POA: Diagnosis not present

## 2020-02-19 DIAGNOSIS — R2689 Other abnormalities of gait and mobility: Secondary | ICD-10-CM | POA: Diagnosis not present

## 2020-02-20 DIAGNOSIS — R2689 Other abnormalities of gait and mobility: Secondary | ICD-10-CM | POA: Diagnosis not present

## 2020-02-20 DIAGNOSIS — R2681 Unsteadiness on feet: Secondary | ICD-10-CM | POA: Diagnosis not present

## 2020-02-20 DIAGNOSIS — M6281 Muscle weakness (generalized): Secondary | ICD-10-CM | POA: Diagnosis not present

## 2020-02-20 DIAGNOSIS — R278 Other lack of coordination: Secondary | ICD-10-CM | POA: Diagnosis not present

## 2020-02-22 DIAGNOSIS — R2681 Unsteadiness on feet: Secondary | ICD-10-CM | POA: Diagnosis not present

## 2020-02-22 DIAGNOSIS — M6281 Muscle weakness (generalized): Secondary | ICD-10-CM | POA: Diagnosis not present

## 2020-02-22 DIAGNOSIS — R2689 Other abnormalities of gait and mobility: Secondary | ICD-10-CM | POA: Diagnosis not present

## 2020-02-22 DIAGNOSIS — R278 Other lack of coordination: Secondary | ICD-10-CM | POA: Diagnosis not present

## 2020-02-26 DIAGNOSIS — R2681 Unsteadiness on feet: Secondary | ICD-10-CM | POA: Diagnosis not present

## 2020-02-26 DIAGNOSIS — R2689 Other abnormalities of gait and mobility: Secondary | ICD-10-CM | POA: Diagnosis not present

## 2020-02-27 DIAGNOSIS — R2689 Other abnormalities of gait and mobility: Secondary | ICD-10-CM | POA: Diagnosis not present

## 2020-02-27 DIAGNOSIS — R2681 Unsteadiness on feet: Secondary | ICD-10-CM | POA: Diagnosis not present

## 2020-02-28 DIAGNOSIS — R2689 Other abnormalities of gait and mobility: Secondary | ICD-10-CM | POA: Diagnosis not present

## 2020-02-28 DIAGNOSIS — R2681 Unsteadiness on feet: Secondary | ICD-10-CM | POA: Diagnosis not present

## 2020-03-04 DIAGNOSIS — R2681 Unsteadiness on feet: Secondary | ICD-10-CM | POA: Diagnosis not present

## 2020-03-04 DIAGNOSIS — R2689 Other abnormalities of gait and mobility: Secondary | ICD-10-CM | POA: Diagnosis not present

## 2020-03-05 DIAGNOSIS — R2681 Unsteadiness on feet: Secondary | ICD-10-CM | POA: Diagnosis not present

## 2020-03-05 DIAGNOSIS — R2689 Other abnormalities of gait and mobility: Secondary | ICD-10-CM | POA: Diagnosis not present

## 2020-03-07 DIAGNOSIS — R2681 Unsteadiness on feet: Secondary | ICD-10-CM | POA: Diagnosis not present

## 2020-03-07 DIAGNOSIS — R2689 Other abnormalities of gait and mobility: Secondary | ICD-10-CM | POA: Diagnosis not present

## 2020-03-12 DIAGNOSIS — R2689 Other abnormalities of gait and mobility: Secondary | ICD-10-CM | POA: Diagnosis not present

## 2020-03-12 DIAGNOSIS — R2681 Unsteadiness on feet: Secondary | ICD-10-CM | POA: Diagnosis not present

## 2020-03-13 DIAGNOSIS — R2689 Other abnormalities of gait and mobility: Secondary | ICD-10-CM | POA: Diagnosis not present

## 2020-03-13 DIAGNOSIS — R2681 Unsteadiness on feet: Secondary | ICD-10-CM | POA: Diagnosis not present

## 2020-03-14 DIAGNOSIS — R2681 Unsteadiness on feet: Secondary | ICD-10-CM | POA: Diagnosis not present

## 2020-03-14 DIAGNOSIS — R2689 Other abnormalities of gait and mobility: Secondary | ICD-10-CM | POA: Diagnosis not present

## 2020-03-18 DIAGNOSIS — F0390 Unspecified dementia without behavioral disturbance: Secondary | ICD-10-CM | POA: Diagnosis not present

## 2020-03-18 DIAGNOSIS — G301 Alzheimer's disease with late onset: Secondary | ICD-10-CM | POA: Diagnosis not present

## 2020-03-18 DIAGNOSIS — E213 Hyperparathyroidism, unspecified: Secondary | ICD-10-CM | POA: Diagnosis not present

## 2020-03-18 DIAGNOSIS — E119 Type 2 diabetes mellitus without complications: Secondary | ICD-10-CM | POA: Diagnosis not present

## 2020-03-18 DIAGNOSIS — R2689 Other abnormalities of gait and mobility: Secondary | ICD-10-CM | POA: Diagnosis not present

## 2020-03-18 DIAGNOSIS — R2681 Unsteadiness on feet: Secondary | ICD-10-CM | POA: Diagnosis not present

## 2020-03-19 DIAGNOSIS — R2681 Unsteadiness on feet: Secondary | ICD-10-CM | POA: Diagnosis not present

## 2020-03-19 DIAGNOSIS — R2689 Other abnormalities of gait and mobility: Secondary | ICD-10-CM | POA: Diagnosis not present

## 2020-03-20 DIAGNOSIS — E119 Type 2 diabetes mellitus without complications: Secondary | ICD-10-CM | POA: Diagnosis not present

## 2020-03-20 DIAGNOSIS — D649 Anemia, unspecified: Secondary | ICD-10-CM | POA: Diagnosis not present

## 2020-03-20 DIAGNOSIS — I10 Essential (primary) hypertension: Secondary | ICD-10-CM | POA: Diagnosis not present

## 2020-03-21 DIAGNOSIS — R2689 Other abnormalities of gait and mobility: Secondary | ICD-10-CM | POA: Diagnosis not present

## 2020-03-21 DIAGNOSIS — R2681 Unsteadiness on feet: Secondary | ICD-10-CM | POA: Diagnosis not present

## 2020-03-22 DIAGNOSIS — Z20828 Contact with and (suspected) exposure to other viral communicable diseases: Secondary | ICD-10-CM | POA: Diagnosis not present

## 2020-03-26 DIAGNOSIS — E119 Type 2 diabetes mellitus without complications: Secondary | ICD-10-CM | POA: Diagnosis not present

## 2020-03-26 DIAGNOSIS — R32 Unspecified urinary incontinence: Secondary | ICD-10-CM | POA: Diagnosis not present

## 2020-03-26 DIAGNOSIS — R2689 Other abnormalities of gait and mobility: Secondary | ICD-10-CM | POA: Diagnosis not present

## 2020-03-26 DIAGNOSIS — R2681 Unsteadiness on feet: Secondary | ICD-10-CM | POA: Diagnosis not present

## 2020-03-27 DIAGNOSIS — R2689 Other abnormalities of gait and mobility: Secondary | ICD-10-CM | POA: Diagnosis not present

## 2020-03-27 DIAGNOSIS — R2681 Unsteadiness on feet: Secondary | ICD-10-CM | POA: Diagnosis not present

## 2020-03-28 DIAGNOSIS — R2689 Other abnormalities of gait and mobility: Secondary | ICD-10-CM | POA: Diagnosis not present

## 2020-03-28 DIAGNOSIS — R2681 Unsteadiness on feet: Secondary | ICD-10-CM | POA: Diagnosis not present

## 2020-04-01 DIAGNOSIS — R2689 Other abnormalities of gait and mobility: Secondary | ICD-10-CM | POA: Diagnosis not present

## 2020-04-01 DIAGNOSIS — R2681 Unsteadiness on feet: Secondary | ICD-10-CM | POA: Diagnosis not present

## 2020-04-02 DIAGNOSIS — R2689 Other abnormalities of gait and mobility: Secondary | ICD-10-CM | POA: Diagnosis not present

## 2020-04-02 DIAGNOSIS — R2681 Unsteadiness on feet: Secondary | ICD-10-CM | POA: Diagnosis not present

## 2020-04-04 DIAGNOSIS — R2681 Unsteadiness on feet: Secondary | ICD-10-CM | POA: Diagnosis not present

## 2020-04-04 DIAGNOSIS — R2689 Other abnormalities of gait and mobility: Secondary | ICD-10-CM | POA: Diagnosis not present

## 2020-04-08 DIAGNOSIS — R2681 Unsteadiness on feet: Secondary | ICD-10-CM | POA: Diagnosis not present

## 2020-04-08 DIAGNOSIS — R2689 Other abnormalities of gait and mobility: Secondary | ICD-10-CM | POA: Diagnosis not present

## 2020-04-09 DIAGNOSIS — R2689 Other abnormalities of gait and mobility: Secondary | ICD-10-CM | POA: Diagnosis not present

## 2020-04-09 DIAGNOSIS — R2681 Unsteadiness on feet: Secondary | ICD-10-CM | POA: Diagnosis not present

## 2020-04-11 DIAGNOSIS — R2681 Unsteadiness on feet: Secondary | ICD-10-CM | POA: Diagnosis not present

## 2020-04-11 DIAGNOSIS — R2689 Other abnormalities of gait and mobility: Secondary | ICD-10-CM | POA: Diagnosis not present

## 2020-04-15 DIAGNOSIS — R2689 Other abnormalities of gait and mobility: Secondary | ICD-10-CM | POA: Diagnosis not present

## 2020-04-15 DIAGNOSIS — R2681 Unsteadiness on feet: Secondary | ICD-10-CM | POA: Diagnosis not present

## 2020-04-16 DIAGNOSIS — R2681 Unsteadiness on feet: Secondary | ICD-10-CM | POA: Diagnosis not present

## 2020-04-16 DIAGNOSIS — R2689 Other abnormalities of gait and mobility: Secondary | ICD-10-CM | POA: Diagnosis not present

## 2020-06-03 DIAGNOSIS — Z23 Encounter for immunization: Secondary | ICD-10-CM | POA: Diagnosis not present

## 2020-06-11 DIAGNOSIS — Z20828 Contact with and (suspected) exposure to other viral communicable diseases: Secondary | ICD-10-CM | POA: Diagnosis not present

## 2020-06-19 DIAGNOSIS — Z20828 Contact with and (suspected) exposure to other viral communicable diseases: Secondary | ICD-10-CM | POA: Diagnosis not present

## 2020-06-24 DIAGNOSIS — I10 Essential (primary) hypertension: Secondary | ICD-10-CM | POA: Diagnosis not present

## 2020-06-24 DIAGNOSIS — F0390 Unspecified dementia without behavioral disturbance: Secondary | ICD-10-CM | POA: Diagnosis not present

## 2020-06-24 DIAGNOSIS — E119 Type 2 diabetes mellitus without complications: Secondary | ICD-10-CM | POA: Diagnosis not present

## 2020-06-26 DIAGNOSIS — E119 Type 2 diabetes mellitus without complications: Secondary | ICD-10-CM | POA: Diagnosis not present

## 2020-07-02 DIAGNOSIS — F0390 Unspecified dementia without behavioral disturbance: Secondary | ICD-10-CM | POA: Diagnosis not present

## 2020-07-02 DIAGNOSIS — I10 Essential (primary) hypertension: Secondary | ICD-10-CM | POA: Diagnosis not present

## 2020-07-02 DIAGNOSIS — R609 Edema, unspecified: Secondary | ICD-10-CM | POA: Diagnosis not present

## 2020-07-20 DIAGNOSIS — Z794 Long term (current) use of insulin: Secondary | ICD-10-CM | POA: Diagnosis not present

## 2020-07-20 DIAGNOSIS — I10 Essential (primary) hypertension: Secondary | ICD-10-CM | POA: Diagnosis not present

## 2020-07-20 DIAGNOSIS — L97522 Non-pressure chronic ulcer of other part of left foot with fat layer exposed: Secondary | ICD-10-CM | POA: Diagnosis not present

## 2020-07-20 DIAGNOSIS — F0390 Unspecified dementia without behavioral disturbance: Secondary | ICD-10-CM | POA: Diagnosis not present

## 2020-07-20 DIAGNOSIS — E11621 Type 2 diabetes mellitus with foot ulcer: Secondary | ICD-10-CM | POA: Diagnosis not present

## 2020-07-20 DIAGNOSIS — Z7982 Long term (current) use of aspirin: Secondary | ICD-10-CM | POA: Diagnosis not present

## 2020-07-20 DIAGNOSIS — Z791 Long term (current) use of non-steroidal anti-inflammatories (NSAID): Secondary | ICD-10-CM | POA: Diagnosis not present

## 2020-08-16 DIAGNOSIS — Z791 Long term (current) use of non-steroidal anti-inflammatories (NSAID): Secondary | ICD-10-CM | POA: Diagnosis not present

## 2020-08-16 DIAGNOSIS — I10 Essential (primary) hypertension: Secondary | ICD-10-CM | POA: Diagnosis not present

## 2020-08-16 DIAGNOSIS — Z794 Long term (current) use of insulin: Secondary | ICD-10-CM | POA: Diagnosis not present

## 2020-08-16 DIAGNOSIS — L97522 Non-pressure chronic ulcer of other part of left foot with fat layer exposed: Secondary | ICD-10-CM | POA: Diagnosis not present

## 2020-08-16 DIAGNOSIS — F0390 Unspecified dementia without behavioral disturbance: Secondary | ICD-10-CM | POA: Diagnosis not present

## 2020-08-16 DIAGNOSIS — E11621 Type 2 diabetes mellitus with foot ulcer: Secondary | ICD-10-CM | POA: Diagnosis not present

## 2020-08-16 DIAGNOSIS — Z7982 Long term (current) use of aspirin: Secondary | ICD-10-CM | POA: Diagnosis not present

## 2020-09-02 DIAGNOSIS — E11621 Type 2 diabetes mellitus with foot ulcer: Secondary | ICD-10-CM | POA: Diagnosis not present

## 2020-09-02 DIAGNOSIS — F0390 Unspecified dementia without behavioral disturbance: Secondary | ICD-10-CM | POA: Diagnosis not present

## 2020-09-02 DIAGNOSIS — L97522 Non-pressure chronic ulcer of other part of left foot with fat layer exposed: Secondary | ICD-10-CM | POA: Diagnosis not present

## 2020-09-02 DIAGNOSIS — Z791 Long term (current) use of non-steroidal anti-inflammatories (NSAID): Secondary | ICD-10-CM | POA: Diagnosis not present

## 2020-09-02 DIAGNOSIS — I10 Essential (primary) hypertension: Secondary | ICD-10-CM | POA: Diagnosis not present

## 2020-09-02 DIAGNOSIS — Z7982 Long term (current) use of aspirin: Secondary | ICD-10-CM | POA: Diagnosis not present

## 2020-09-02 DIAGNOSIS — Z794 Long term (current) use of insulin: Secondary | ICD-10-CM | POA: Diagnosis not present

## 2020-09-11 DIAGNOSIS — Z20828 Contact with and (suspected) exposure to other viral communicable diseases: Secondary | ICD-10-CM | POA: Diagnosis not present

## 2020-09-17 DIAGNOSIS — Z794 Long term (current) use of insulin: Secondary | ICD-10-CM | POA: Diagnosis not present

## 2020-09-17 DIAGNOSIS — F0390 Unspecified dementia without behavioral disturbance: Secondary | ICD-10-CM | POA: Diagnosis not present

## 2020-09-17 DIAGNOSIS — Z7982 Long term (current) use of aspirin: Secondary | ICD-10-CM | POA: Diagnosis not present

## 2020-09-17 DIAGNOSIS — I10 Essential (primary) hypertension: Secondary | ICD-10-CM | POA: Diagnosis not present

## 2020-09-17 DIAGNOSIS — E11621 Type 2 diabetes mellitus with foot ulcer: Secondary | ICD-10-CM | POA: Diagnosis not present

## 2020-09-17 DIAGNOSIS — L97522 Non-pressure chronic ulcer of other part of left foot with fat layer exposed: Secondary | ICD-10-CM | POA: Diagnosis not present

## 2020-09-17 DIAGNOSIS — Z791 Long term (current) use of non-steroidal anti-inflammatories (NSAID): Secondary | ICD-10-CM | POA: Diagnosis not present

## 2020-11-08 ENCOUNTER — Other Ambulatory Visit: Payer: Self-pay | Admitting: Internal Medicine

## 2020-11-08 DIAGNOSIS — Z1231 Encounter for screening mammogram for malignant neoplasm of breast: Secondary | ICD-10-CM

## 2020-11-25 DIAGNOSIS — I10 Essential (primary) hypertension: Secondary | ICD-10-CM | POA: Diagnosis not present

## 2020-11-25 DIAGNOSIS — Z741 Need for assistance with personal care: Secondary | ICD-10-CM | POA: Diagnosis not present

## 2020-11-25 DIAGNOSIS — F0390 Unspecified dementia without behavioral disturbance: Secondary | ICD-10-CM | POA: Diagnosis not present

## 2020-11-25 DIAGNOSIS — E119 Type 2 diabetes mellitus without complications: Secondary | ICD-10-CM | POA: Diagnosis not present

## 2020-11-25 DIAGNOSIS — E785 Hyperlipidemia, unspecified: Secondary | ICD-10-CM | POA: Diagnosis not present

## 2020-11-25 DIAGNOSIS — G894 Chronic pain syndrome: Secondary | ICD-10-CM | POA: Diagnosis not present

## 2020-12-04 DIAGNOSIS — E119 Type 2 diabetes mellitus without complications: Secondary | ICD-10-CM | POA: Diagnosis not present

## 2020-12-04 DIAGNOSIS — G301 Alzheimer's disease with late onset: Secondary | ICD-10-CM | POA: Diagnosis not present

## 2020-12-04 DIAGNOSIS — I10 Essential (primary) hypertension: Secondary | ICD-10-CM | POA: Diagnosis not present

## 2020-12-04 DIAGNOSIS — E785 Hyperlipidemia, unspecified: Secondary | ICD-10-CM | POA: Diagnosis not present

## 2020-12-11 DIAGNOSIS — R809 Proteinuria, unspecified: Secondary | ICD-10-CM | POA: Diagnosis not present

## 2020-12-16 DIAGNOSIS — E119 Type 2 diabetes mellitus without complications: Secondary | ICD-10-CM | POA: Diagnosis not present

## 2020-12-16 DIAGNOSIS — F0390 Unspecified dementia without behavioral disturbance: Secondary | ICD-10-CM | POA: Diagnosis not present

## 2020-12-16 DIAGNOSIS — I10 Essential (primary) hypertension: Secondary | ICD-10-CM | POA: Diagnosis not present

## 2020-12-16 DIAGNOSIS — E785 Hyperlipidemia, unspecified: Secondary | ICD-10-CM | POA: Diagnosis not present

## 2021-01-02 ENCOUNTER — Other Ambulatory Visit: Payer: Self-pay

## 2021-01-02 ENCOUNTER — Ambulatory Visit
Admission: RE | Admit: 2021-01-02 | Discharge: 2021-01-02 | Disposition: A | Discharge status: Home / Self Care | Payer: PPO | Source: Ambulatory Visit | Attending: Internal Medicine | Admitting: Internal Medicine

## 2021-01-02 DIAGNOSIS — Z1231 Encounter for screening mammogram for malignant neoplasm of breast: Secondary | ICD-10-CM | POA: Diagnosis not present

## 2021-01-13 DIAGNOSIS — R32 Unspecified urinary incontinence: Secondary | ICD-10-CM | POA: Diagnosis not present

## 2021-01-13 DIAGNOSIS — E119 Type 2 diabetes mellitus without complications: Secondary | ICD-10-CM | POA: Diagnosis not present

## 2021-01-13 DIAGNOSIS — F0390 Unspecified dementia without behavioral disturbance: Secondary | ICD-10-CM | POA: Diagnosis not present

## 2021-01-13 DIAGNOSIS — R2689 Other abnormalities of gait and mobility: Secondary | ICD-10-CM | POA: Diagnosis not present

## 2021-01-24 DIAGNOSIS — Z03818 Encounter for observation for suspected exposure to other biological agents ruled out: Secondary | ICD-10-CM | POA: Diagnosis not present

## 2021-01-30 DIAGNOSIS — Z20828 Contact with and (suspected) exposure to other viral communicable diseases: Secondary | ICD-10-CM | POA: Diagnosis not present

## 2021-01-31 DIAGNOSIS — M79642 Pain in left hand: Secondary | ICD-10-CM | POA: Diagnosis not present

## 2021-01-31 DIAGNOSIS — M25532 Pain in left wrist: Secondary | ICD-10-CM | POA: Diagnosis not present

## 2021-02-03 DIAGNOSIS — M109 Gout, unspecified: Secondary | ICD-10-CM | POA: Diagnosis not present

## 2021-02-03 DIAGNOSIS — M25532 Pain in left wrist: Secondary | ICD-10-CM | POA: Diagnosis not present

## 2021-02-06 DIAGNOSIS — Z20828 Contact with and (suspected) exposure to other viral communicable diseases: Secondary | ICD-10-CM | POA: Diagnosis not present

## 2021-02-12 DIAGNOSIS — F0390 Unspecified dementia without behavioral disturbance: Secondary | ICD-10-CM | POA: Diagnosis not present

## 2021-02-12 DIAGNOSIS — R2689 Other abnormalities of gait and mobility: Secondary | ICD-10-CM | POA: Diagnosis not present

## 2021-02-12 DIAGNOSIS — E119 Type 2 diabetes mellitus without complications: Secondary | ICD-10-CM | POA: Diagnosis not present

## 2021-02-13 DIAGNOSIS — Z20828 Contact with and (suspected) exposure to other viral communicable diseases: Secondary | ICD-10-CM | POA: Diagnosis not present

## 2021-02-17 DIAGNOSIS — F0390 Unspecified dementia without behavioral disturbance: Secondary | ICD-10-CM | POA: Diagnosis not present

## 2021-02-17 DIAGNOSIS — M109 Gout, unspecified: Secondary | ICD-10-CM | POA: Diagnosis not present

## 2021-02-19 DIAGNOSIS — I10 Essential (primary) hypertension: Secondary | ICD-10-CM | POA: Diagnosis not present

## 2021-02-19 DIAGNOSIS — Z79899 Other long term (current) drug therapy: Secondary | ICD-10-CM | POA: Diagnosis not present

## 2021-02-19 DIAGNOSIS — Z5181 Encounter for therapeutic drug level monitoring: Secondary | ICD-10-CM | POA: Diagnosis not present

## 2021-02-20 DIAGNOSIS — Z20828 Contact with and (suspected) exposure to other viral communicable diseases: Secondary | ICD-10-CM | POA: Diagnosis not present

## 2021-02-24 DIAGNOSIS — M10332 Gout due to renal impairment, left wrist: Secondary | ICD-10-CM | POA: Diagnosis not present

## 2021-02-25 DIAGNOSIS — R4189 Other symptoms and signs involving cognitive functions and awareness: Secondary | ICD-10-CM | POA: Diagnosis not present

## 2021-02-27 DIAGNOSIS — Z20828 Contact with and (suspected) exposure to other viral communicable diseases: Secondary | ICD-10-CM | POA: Diagnosis not present

## 2021-03-25 DIAGNOSIS — R4189 Other symptoms and signs involving cognitive functions and awareness: Secondary | ICD-10-CM | POA: Diagnosis not present

## 2021-04-28 DIAGNOSIS — R635 Abnormal weight gain: Secondary | ICD-10-CM | POA: Diagnosis not present

## 2021-04-28 DIAGNOSIS — M109 Gout, unspecified: Secondary | ICD-10-CM | POA: Diagnosis not present

## 2021-04-28 DIAGNOSIS — G301 Alzheimer's disease with late onset: Secondary | ICD-10-CM | POA: Diagnosis not present

## 2021-04-28 DIAGNOSIS — R296 Repeated falls: Secondary | ICD-10-CM | POA: Diagnosis not present

## 2021-04-28 DIAGNOSIS — F0390 Unspecified dementia without behavioral disturbance: Secondary | ICD-10-CM | POA: Diagnosis not present

## 2021-04-28 DIAGNOSIS — E119 Type 2 diabetes mellitus without complications: Secondary | ICD-10-CM | POA: Diagnosis not present

## 2021-04-28 DIAGNOSIS — D631 Anemia in chronic kidney disease: Secondary | ICD-10-CM | POA: Diagnosis not present

## 2021-04-28 DIAGNOSIS — I10 Essential (primary) hypertension: Secondary | ICD-10-CM | POA: Diagnosis not present

## 2021-05-01 DIAGNOSIS — E119 Type 2 diabetes mellitus without complications: Secondary | ICD-10-CM | POA: Diagnosis not present

## 2021-05-01 DIAGNOSIS — Z79899 Other long term (current) drug therapy: Secondary | ICD-10-CM | POA: Diagnosis not present

## 2021-05-01 DIAGNOSIS — I10 Essential (primary) hypertension: Secondary | ICD-10-CM | POA: Diagnosis not present

## 2021-05-06 DIAGNOSIS — M109 Gout, unspecified: Secondary | ICD-10-CM | POA: Diagnosis not present

## 2021-05-06 DIAGNOSIS — E119 Type 2 diabetes mellitus without complications: Secondary | ICD-10-CM | POA: Diagnosis not present

## 2021-05-06 DIAGNOSIS — R4189 Other symptoms and signs involving cognitive functions and awareness: Secondary | ICD-10-CM | POA: Diagnosis not present

## 2021-05-06 DIAGNOSIS — D649 Anemia, unspecified: Secondary | ICD-10-CM | POA: Diagnosis not present

## 2021-05-06 DIAGNOSIS — I10 Essential (primary) hypertension: Secondary | ICD-10-CM | POA: Diagnosis not present

## 2021-06-02 DIAGNOSIS — G301 Alzheimer's disease with late onset: Secondary | ICD-10-CM | POA: Diagnosis not present

## 2021-06-02 DIAGNOSIS — E119 Type 2 diabetes mellitus without complications: Secondary | ICD-10-CM | POA: Diagnosis not present

## 2021-06-02 DIAGNOSIS — F0390 Unspecified dementia without behavioral disturbance: Secondary | ICD-10-CM | POA: Diagnosis not present

## 2021-06-05 DIAGNOSIS — R4189 Other symptoms and signs involving cognitive functions and awareness: Secondary | ICD-10-CM | POA: Diagnosis not present

## 2021-06-09 DIAGNOSIS — R8289 Other abnormal findings on cytological and histological examination of urine: Secondary | ICD-10-CM | POA: Diagnosis not present

## 2021-06-09 DIAGNOSIS — N3946 Mixed incontinence: Secondary | ICD-10-CM | POA: Diagnosis not present

## 2021-06-09 DIAGNOSIS — Z87440 Personal history of urinary (tract) infections: Secondary | ICD-10-CM | POA: Diagnosis not present

## 2021-06-09 DIAGNOSIS — G301 Alzheimer's disease with late onset: Secondary | ICD-10-CM | POA: Diagnosis not present

## 2021-06-19 ENCOUNTER — Emergency Department (HOSPITAL_COMMUNITY): Payer: PPO

## 2021-06-19 ENCOUNTER — Inpatient Hospital Stay (HOSPITAL_COMMUNITY)
Admission: EM | Admit: 2021-06-19 | Discharge: 2021-06-25 | DRG: 072 | Disposition: A | Discharge status: Skilled Nursing Facility | Payer: PPO | Source: Skilled Nursing Facility | Attending: Internal Medicine | Admitting: Internal Medicine

## 2021-06-19 ENCOUNTER — Encounter (HOSPITAL_COMMUNITY): Payer: Self-pay | Admitting: Emergency Medicine

## 2021-06-19 DIAGNOSIS — E876 Hypokalemia: Secondary | ICD-10-CM | POA: Diagnosis present

## 2021-06-19 DIAGNOSIS — K5900 Constipation, unspecified: Secondary | ICD-10-CM | POA: Diagnosis present

## 2021-06-19 DIAGNOSIS — E669 Obesity, unspecified: Secondary | ICD-10-CM | POA: Diagnosis present

## 2021-06-19 DIAGNOSIS — Z885 Allergy status to narcotic agent status: Secondary | ICD-10-CM

## 2021-06-19 DIAGNOSIS — I119 Hypertensive heart disease without heart failure: Secondary | ICD-10-CM | POA: Diagnosis present

## 2021-06-19 DIAGNOSIS — Z7984 Long term (current) use of oral hypoglycemic drugs: Secondary | ICD-10-CM

## 2021-06-19 DIAGNOSIS — F03A0 Unspecified dementia, mild, without behavioral disturbance, psychotic disturbance, mood disturbance, and anxiety: Secondary | ICD-10-CM | POA: Diagnosis not present

## 2021-06-19 DIAGNOSIS — E119 Type 2 diabetes mellitus without complications: Secondary | ICD-10-CM | POA: Diagnosis present

## 2021-06-19 DIAGNOSIS — Z79899 Other long term (current) drug therapy: Secondary | ICD-10-CM | POA: Diagnosis not present

## 2021-06-19 DIAGNOSIS — Z8719 Personal history of other diseases of the digestive system: Secondary | ICD-10-CM

## 2021-06-19 DIAGNOSIS — E8809 Other disorders of plasma-protein metabolism, not elsewhere classified: Secondary | ICD-10-CM | POA: Diagnosis present

## 2021-06-19 DIAGNOSIS — K3184 Gastroparesis: Secondary | ICD-10-CM

## 2021-06-19 DIAGNOSIS — G4733 Obstructive sleep apnea (adult) (pediatric): Secondary | ICD-10-CM | POA: Diagnosis present

## 2021-06-19 DIAGNOSIS — Z794 Long term (current) use of insulin: Secondary | ICD-10-CM

## 2021-06-19 DIAGNOSIS — G894 Chronic pain syndrome: Secondary | ICD-10-CM | POA: Diagnosis not present

## 2021-06-19 DIAGNOSIS — G9341 Metabolic encephalopathy: Principal | ICD-10-CM | POA: Diagnosis present

## 2021-06-19 DIAGNOSIS — Z20822 Contact with and (suspected) exposure to covid-19: Secondary | ICD-10-CM | POA: Diagnosis not present

## 2021-06-19 DIAGNOSIS — R531 Weakness: Secondary | ICD-10-CM | POA: Diagnosis not present

## 2021-06-19 DIAGNOSIS — T40425A Adverse effect of tramadol, initial encounter: Secondary | ICD-10-CM | POA: Diagnosis not present

## 2021-06-19 DIAGNOSIS — T441X5A Adverse effect of other parasympathomimetics [cholinergics], initial encounter: Secondary | ICD-10-CM | POA: Diagnosis present

## 2021-06-19 DIAGNOSIS — R2689 Other abnormalities of gait and mobility: Secondary | ICD-10-CM | POA: Diagnosis not present

## 2021-06-19 DIAGNOSIS — E860 Dehydration: Secondary | ICD-10-CM | POA: Diagnosis not present

## 2021-06-19 DIAGNOSIS — Z888 Allergy status to other drugs, medicaments and biological substances status: Secondary | ICD-10-CM | POA: Diagnosis not present

## 2021-06-19 DIAGNOSIS — K429 Umbilical hernia without obstruction or gangrene: Secondary | ICD-10-CM | POA: Diagnosis not present

## 2021-06-19 DIAGNOSIS — Z91040 Latex allergy status: Secondary | ICD-10-CM

## 2021-06-19 DIAGNOSIS — Y92129 Unspecified place in nursing home as the place of occurrence of the external cause: Secondary | ICD-10-CM | POA: Diagnosis not present

## 2021-06-19 DIAGNOSIS — G9340 Encephalopathy, unspecified: Secondary | ICD-10-CM | POA: Diagnosis not present

## 2021-06-19 DIAGNOSIS — D509 Iron deficiency anemia, unspecified: Secondary | ICD-10-CM | POA: Diagnosis not present

## 2021-06-19 DIAGNOSIS — R600 Localized edema: Secondary | ICD-10-CM | POA: Diagnosis present

## 2021-06-19 DIAGNOSIS — Z7401 Bed confinement status: Secondary | ICD-10-CM | POA: Diagnosis not present

## 2021-06-19 DIAGNOSIS — F02A0 Dementia in other diseases classified elsewhere, mild, without behavioral disturbance, psychotic disturbance, mood disturbance, and anxiety: Secondary | ICD-10-CM | POA: Diagnosis not present

## 2021-06-19 DIAGNOSIS — E038 Other specified hypothyroidism: Secondary | ICD-10-CM | POA: Diagnosis not present

## 2021-06-19 DIAGNOSIS — E118 Type 2 diabetes mellitus with unspecified complications: Secondary | ICD-10-CM | POA: Diagnosis not present

## 2021-06-19 DIAGNOSIS — S88112D Complete traumatic amputation at level between knee and ankle, left lower leg, subsequent encounter: Secondary | ICD-10-CM | POA: Diagnosis not present

## 2021-06-19 DIAGNOSIS — R609 Edema, unspecified: Secondary | ICD-10-CM | POA: Diagnosis not present

## 2021-06-19 DIAGNOSIS — M6281 Muscle weakness (generalized): Secondary | ICD-10-CM | POA: Diagnosis not present

## 2021-06-19 DIAGNOSIS — Z6839 Body mass index (BMI) 39.0-39.9, adult: Secondary | ICD-10-CM | POA: Diagnosis not present

## 2021-06-19 DIAGNOSIS — E210 Primary hyperparathyroidism: Secondary | ICD-10-CM | POA: Diagnosis present

## 2021-06-19 DIAGNOSIS — E039 Hypothyroidism, unspecified: Secondary | ICD-10-CM | POA: Diagnosis not present

## 2021-06-19 DIAGNOSIS — E785 Hyperlipidemia, unspecified: Secondary | ICD-10-CM | POA: Diagnosis present

## 2021-06-19 DIAGNOSIS — Z8042 Family history of malignant neoplasm of prostate: Secondary | ICD-10-CM

## 2021-06-19 DIAGNOSIS — T402X5A Adverse effect of other opioids, initial encounter: Secondary | ICD-10-CM | POA: Diagnosis present

## 2021-06-19 DIAGNOSIS — R5381 Other malaise: Secondary | ICD-10-CM | POA: Diagnosis not present

## 2021-06-19 DIAGNOSIS — R5383 Other fatigue: Secondary | ICD-10-CM | POA: Diagnosis not present

## 2021-06-19 DIAGNOSIS — I10 Essential (primary) hypertension: Secondary | ICD-10-CM | POA: Diagnosis present

## 2021-06-19 DIAGNOSIS — M545 Low back pain, unspecified: Secondary | ICD-10-CM | POA: Diagnosis present

## 2021-06-19 DIAGNOSIS — R404 Transient alteration of awareness: Secondary | ICD-10-CM | POA: Diagnosis not present

## 2021-06-19 DIAGNOSIS — I4510 Unspecified right bundle-branch block: Secondary | ICD-10-CM | POA: Diagnosis present

## 2021-06-19 DIAGNOSIS — M549 Dorsalgia, unspecified: Secondary | ICD-10-CM | POA: Diagnosis not present

## 2021-06-19 DIAGNOSIS — F0390 Unspecified dementia without behavioral disturbance: Secondary | ICD-10-CM | POA: Diagnosis present

## 2021-06-19 DIAGNOSIS — R4182 Altered mental status, unspecified: Secondary | ICD-10-CM | POA: Diagnosis not present

## 2021-06-19 LAB — CBC WITH DIFFERENTIAL/PLATELET
Abs Immature Granulocytes: 0.03 10*3/uL (ref 0.00–0.07)
Basophils Absolute: 0.1 10*3/uL (ref 0.0–0.1)
Basophils Relative: 1 %
Eosinophils Absolute: 0.1 10*3/uL (ref 0.0–0.5)
Eosinophils Relative: 1 %
HCT: 29.6 % — ABNORMAL LOW (ref 36.0–46.0)
Hemoglobin: 9.4 g/dL — ABNORMAL LOW (ref 12.0–15.0)
Immature Granulocytes: 0 %
Lymphocytes Relative: 26 %
Lymphs Abs: 2.1 10*3/uL (ref 0.7–4.0)
MCH: 24.7 pg — ABNORMAL LOW (ref 26.0–34.0)
MCHC: 31.8 g/dL (ref 30.0–36.0)
MCV: 77.9 fL — ABNORMAL LOW (ref 80.0–100.0)
Monocytes Absolute: 0.7 10*3/uL (ref 0.1–1.0)
Monocytes Relative: 9 %
Neutro Abs: 5.2 10*3/uL (ref 1.7–7.7)
Neutrophils Relative %: 63 %
Platelets: 263 10*3/uL (ref 150–400)
RBC: 3.8 MIL/uL — ABNORMAL LOW (ref 3.87–5.11)
RDW: 14.8 % (ref 11.5–15.5)
WBC: 8.2 10*3/uL (ref 4.0–10.5)
nRBC: 0 % (ref 0.0–0.2)

## 2021-06-19 LAB — LIPASE, BLOOD: Lipase: 32 U/L (ref 11–51)

## 2021-06-19 LAB — RESP PANEL BY RT-PCR (FLU A&B, COVID) ARPGX2
Influenza A by PCR: NEGATIVE
Influenza B by PCR: NEGATIVE
SARS Coronavirus 2 by RT PCR: NEGATIVE

## 2021-06-19 LAB — COMPREHENSIVE METABOLIC PANEL
ALT: 13 U/L (ref 0–44)
AST: 16 U/L (ref 15–41)
Albumin: 3.5 g/dL (ref 3.5–5.0)
Alkaline Phosphatase: 62 U/L (ref 38–126)
Anion gap: 6 (ref 5–15)
BUN: 16 mg/dL (ref 8–23)
CO2: 23 mmol/L (ref 22–32)
Calcium: 10.6 mg/dL — ABNORMAL HIGH (ref 8.9–10.3)
Chloride: 107 mmol/L (ref 98–111)
Creatinine, Ser: 0.92 mg/dL (ref 0.44–1.00)
GFR, Estimated: >60 mL/min (ref >60)
Glucose, Bld: 117 mg/dL — ABNORMAL HIGH (ref 70–99)
Potassium: 4.3 mmol/L (ref 3.5–5.1)
Sodium: 136 mmol/L (ref 135–145)
Total Bilirubin: 0.5 mg/dL (ref 0.3–1.2)
Total Protein: 7.4 g/dL (ref 6.5–8.1)

## 2021-06-19 LAB — TROPONIN I (HIGH SENSITIVITY): Troponin I (High Sensitivity): 7 ng/L (ref <18)

## 2021-06-19 MED ORDER — SODIUM CHLORIDE 0.9 % IV BOLUS
1000.0000 mL | Freq: Once | INTRAVENOUS | Status: AC
  Administered 2021-06-19: 1000 mL via INTRAVENOUS

## 2021-06-19 NOTE — Progress Notes (Signed)
IV Team consult acknowledged, no current intravenous needs. Request to Davy Pique, RN to have phlebotomy draw labs. VAST will await orders to place best line for ordered therapy.

## 2021-06-19 NOTE — ED Notes (Signed)
Phlebotomy contact to draw labs for the patient.

## 2021-06-19 NOTE — ED Provider Notes (Signed)
[facility] DEPT Provider Note   CSN: [id number] Arrival date & time: 06/19/21  1555     History Chief Complaint  Patient presents with   Weakness   Fatigue    Katelyn [name] is a 76 y.o. female.  Pt is a 75yo female with a hx of DM, HTN, chronic back pain, dementia who presents by EMS from [facility] with increased fatigue.  Per facility, Katelyn [name] has not been as active as Katelyn [name] normally is.  Is reportedly at her baseline mental status.  Pt complains of some pain to her lower back and abdomen.  Says that Katelyn [name] doesn't feel well, but cannot elaborate.  History is limited by her dementia.      Past Medical History:  Diagnosis Date   Allergic rhinitis    Asthma    Chronic pain syndrome    Colon polyp    Diabetes mellitus    H. pylori infection    Hyperlipidemia    Hyperparathyroidism ([place])    Hypertension    Hypertensive heart disease without heart failure    Morbid obesity ([place])    Sleep apnea    Vitamin D deficiency     Patient Active Problem List   Diagnosis Date Noted   Ventral hernia 06/29/2018   Dementia without behavioral disturbance ([place]) 06/29/2018   Dehydration 07/16/2017   Hypercalcemia 07/16/2017   Diabetic gastroparesis associated with type 2 diabetes mellitus ([place]) 07/16/2017   Asthma 07/15/2017   Diabetes mellitus without complication ([place]) 88/50/2774   GERD (gastroesophageal reflux disease) 07/15/2017   Hyperparathyroidism ([place]) 12/87/8676   Acute metabolic encephalopathy 72/05/4708   Abdominal pain 07/15/2017   Diabetic neuropathy ([place]) 05/07/2015   Achilles tendinitis of left lower extremity 03/30/2014   Spinal stenosis, lumbar region, with neurogenic claudication 02/16/2014   Other chronic postoperative pain 02/16/2014   Bilateral knee contractures 02/16/2014   Obstructive sleep apnea 06/26/2013   TRIGGER FINGER 04/29/2010   Allergic rhinitis, cause unspecified 03/24/2010   OSTEOARTHRITIS 02/10/2010   DENTAL  CARIES 09/24/2009   DIABETIC  RETINOPATHY 04/29/2009   ONYCHOMYCOSIS, TOENAILS 04/08/2009   DYSLIPIDEMIA 04/08/2009   EDEMA 04/08/2009   Essential hypertension 12/24/2008   ELECTROCARDIOGRAM, ABNORMAL 12/17/2008   SCIATICA, RIGHT 07/11/2007   Type II or unspecified type diabetes mellitus without mention of complication, not stated as uncontrolled 04/11/2007   ANEMIA DUE TO DIETARY IRON DEFICIENCY 04/11/2007   OSTEOARTHRITIS, GENERALIZED 04/11/2007    Past Surgical History:  Procedure Laterality Date   INSERTION OF MESH N/A 06/29/2018   Procedure: INSERTION OF MESH;  Surgeon: Erroll Luna, MD;  Location: [facility];  Service: General;  Laterality: N/A;   JOINT REPLACEMENT     UMBILICAL HERNIA REPAIR  62/83/6629   UMBILICAL HERNIA REPAIR N/A 06/29/2018   Procedure: HERNIA REPAIR UMBILICAL ADULT, incarcerated,;  Surgeon: Erroll Luna, MD;  Location: [facility];  Service: General;  Laterality: N/A;     OB History   No obstetric history on file.     Family History  Problem Relation Age of Onset   Prostate cancer Father     Social History   Tobacco Use   Smoking status: Never   Smokeless tobacco: Never  Vaping Use   Vaping Use: Never used  Substance Use Topics   Alcohol use: No   Drug use: No    Home Medications Prior to Admission medications   Medication Sig Start Date End Date Taking? Authorizing Provider  albuterol (PROVENTIL HFA;VENTOLIN HFA) 108 (90 BASE) MCG/ACT inhaler Inhale  2 puffs into the lungs 4 (four) times daily.     [provider]  alum & mag hydroxide-simeth ([place]) 200-200-20 MG/5ML suspension Take 30 mLs by mouth every 6 (six) hours as needed for indigestion or heartburn.    [provider]  amLODipine (NORVASC) 10 MG tablet Take 10 mg by mouth daily.     [provider]  aspirin EC 81 MG tablet Take 81 mg by mouth daily.     [provider]  b complex vitamins capsule Take 1 capsule by mouth daily.    [provider]  CVS D3 5000 units capsule Take 5,000 Units by mouth daily. 06/09/18   [provider]  diclofenac sodium (VOLTAREN) 1 % GEL Apply 1 application topically 4 (four) times daily as needed (back pain).  10/30/16   [provider]  esomeprazole (NEXIUM) 40 MG capsule Take 40 mg by mouth daily. 06/21/18   [provider]  famotidine (PEPCID) 20 MG tablet Take 20 mg by mouth 2 (two) times daily.    [provider]  guaifenesin (ROBITUSSIN) 100 MG/5ML syrup Take 200 mg by mouth every 6 (six) hours as needed for cough.    [provider]  hydrochlorothiazide (HYDRODIURIL) 25 MG tablet Take 25 mg by mouth daily. 06/09/18   [provider]  HYDROcodone-acetaminophen (NORCO) 5-325 MG tablet Take 1 tablet by mouth every 6 (six) hours as needed (for pain). 09/07/18   Molpus, John, MD  loperamide (IMODIUM) 2 MG capsule Take 2 mg by mouth as needed for diarrhea or loose stools.    [provider]  magnesium hydroxide (MILK OF MAGNESIA) 400 MG/5ML suspension Take 30 mLs by mouth at bedtime as needed for mild constipation.    [provider]  metFORMIN (GLUCOPHAGE) 1000 MG tablet Take 500 mg by mouth 2 (two) times daily with a meal.     [provider]  mometasone (NASONEX) 50 MCG/ACT nasal spray Place 2 sprays into the nose daily.     [provider]  neomycin-bacitracin-polymyxin (NEOSPORIN) [phone] ointment Apply 1 application topically 4 (four) times daily as needed (minor skin tears/abrasions).    [provider]  oxyCODONE (OXY IR/ROXICODONE) 5 MG immediate release tablet Take 2 tablets (10 mg total) by mouth every 6 (six) hours as needed for moderate pain. 06/30/18   Jill Alexanders, PA-C  polyethylene glycol (MIRALAX / GLYCOLAX) packet Take 17 g by mouth daily as needed for mild constipation. 06/30/18   Jill Alexanders, PA-C  potassium chloride (K-DUR) 10 MEQ tablet Take 1 tablet (10 mEq  total) daily by mouth. 07/17/17   Kayleen Memos, DO  rosuvastatin (CRESTOR) 5 MG tablet Take 5 mg by mouth at bedtime. 06/09/18   [provider]    Allergies    Aspirin, Hydrochlorothiazide, Ibuprofen, Latex, and Tramadol  Review of Systems   Review of Systems  Unable to perform ROS: Dementia   Physical Exam Updated Vital Signs BP (!) 141/84   Pulse (!) 110   Temp 98.4 F (36.9 C) (Oral)   Resp 20   SpO2 100%   Physical Exam Constitutional:      Appearance: Katelyn [name] is well-developed.  HENT:     Head: Normocephalic and atraumatic.  Eyes:     Pupils: Pupils are equal, round, and reactive to light.  Cardiovascular:     Rate and Rhythm: Regular rhythm. Tachycardia present.     Heart sounds: Murmur heard.     Comments: Mild  tachycardia Pulmonary:     Effort: Pulmonary effort is normal. No respiratory distress.     Breath sounds: Normal breath sounds. No wheezing or rales.  Chest:     Chest wall: No tenderness.  Abdominal:     General: Bowel sounds are normal.     Palpations: Abdomen is soft.     Tenderness: There is abdominal tenderness (mild generalized tenderness, somewhat worse across the lower abd). There is no guarding or rebound.  Musculoskeletal:        General: Normal range of motion.     Cervical back: Normal range of motion and neck supple.     Right lower leg: Edema present.     Left lower leg: Edema present.     Comments: 2+pitting edema to the LE bilaterally  Lymphadenopathy:     Cervical: No cervical adenopathy.  Skin:    General: Skin is warm and dry.     Findings: No rash.  Neurological:     General: No focal deficit present.     Mental Status: Katelyn [name] is alert.     Comments: Oriented to person and place only    ED Results / Procedures / Treatments   Labs (all labs ordered are listed, but only abnormal results are displayed) Labs Reviewed  COMPREHENSIVE METABOLIC PANEL - Abnormal; Notable for the following components:      Result Value    Glucose, Bld 117 (*)    Calcium 10.6 (*)    All other components within normal limits  CBC WITH DIFFERENTIAL/PLATELET - Abnormal; Notable for the following components:   RBC 3.80 (*)    Hemoglobin 9.4 (*)    HCT 29.6 (*)    MCV 77.9 (*)    MCH 24.7 (*)    All other components within normal limits  RESP PANEL BY RT-PCR (FLU A&B, COVID) ARPGX2  LIPASE, BLOOD  URINALYSIS, ROUTINE W REFLEX MICROSCOPIC  TROPONIN I (HIGH SENSITIVITY)  TROPONIN I (HIGH SENSITIVITY)    EKG None  Radiology CT Abdomen Pelvis Wo Contrast  Result Date: 06/19/2021 CLINICAL DATA:  Increasing lethargy weakness. Acute nonlocalized abdominal pain. EXAM: CT ABDOMEN AND PELVIS WITHOUT CONTRAST TECHNIQUE: Multidetector CT imaging of the abdomen and pelvis was performed following the standard protocol without IV contrast. COMPARISON:  June 29, 2018 FINDINGS: Lower chest: Bibasilar atelectasis. Hepatobiliary: Unremarkable noncontrast appearance of the hepatic parenchyma. Gallbladder is unremarkable. No biliary ductal dilation. Pancreas: No pancreatic ductal dilation or evidence of acute inflammation. Spleen: Within normal limits. Adrenals/Urinary Tract: Bilateral adrenal glands are unremarkable. No hydronephrosis. No renal, ureteral or bladder calculi visualized. Urinary bladder is unremarkable for degree of distension. Stomach/Bowel: No enteric contrast was administered. Stomach is predominantly decompressed limiting evaluation. No pathologic dilation large or small bowel. The appendix and terminal ileum appear normal. No evidence of acute bowel inflammation. Moderate volume formed stool throughout the colon. Vascular/Lymphatic: Aortic and branch vessel atherosclerosis without abdominal aortic aneurysm. No pathologically enlarged abdominal or pelvic lymph nodes. Reproductive: Calcified uterine leiomyomas. No suspicious adnexal mass. Other: No significant abdominopelvic free fluid. Small fat containing periumbilical hernia.  Musculoskeletal: Anterior wedging deformity of the L2 vertebral body is unchanged from prior. Multilevel degenerative changes spine. No acute osseous abnormality. IMPRESSION: Study is degraded by motion, within this context: 1. Acute abdominopelvic findings. 2. Moderate volume formed stool throughout the colon. Correlate for constipation. 3. Aortic Atherosclerosis (ICD10-I70.0). Electronically Signed   By: Dahlia Bailiff M.D.   On: 06/19/2021 18:09   DG Chest 2 View  Result Date:  06/19/2021 CLINICAL DATA:  Weakness EXAM: CHEST - 2 VIEW COMPARISON:  08/31/2017 FINDINGS: The heart size and mediastinal contours are within normal limits. Both lungs are clear. The visualized skeletal structures are unremarkable. IMPRESSION: No active cardiopulmonary disease. Electronically Signed   By: Randa Ngo M.D.   On: 06/19/2021 18:02    Procedures Procedures   Medications Ordered in ED Medications  sodium chloride 0.9 % bolus 1,000 mL (1,000 mLs Intravenous New Bag/Given 06/19/21 2245)    ED Course  I have reviewed the triage vital signs and the nursing notes.  Pertinent labs & imaging results that were available during my care of the patient were reviewed by me and considered in my medical decision making (see chart for details).    MDM Rules/Calculators/A&P                           Patient is a 75 year old female who is demented that presents with reported generalized weakness.  Katelyn [name] does not have any focal deficits.  Katelyn [name] is awake and alert.  Katelyn [name] is mildly tachycardic.  Her blood pressure is stable.  Labs show a mild anemia but it appears similar to prior values.  Her EKG is similar to her prior EKGs.  Troponin is negative.  Katelyn [name] had some abdominal pain on exam.  CT scan shows constipation but no other acute findings.  Chest x-ray shows no evidence of pneumonia.  Katelyn [name] was given some IV fluids for her tachycardia.  Awaiting a urinalysis and a head CT.  Dr. Letitia Caul to take over care pending these  findings. Final Clinical Impression(s) / ED Diagnoses Final diagnoses:  Weakness    Rx / DC Orders ED Discharge Orders     None        Malvin Johns, MD 06/19/21 2348

## 2021-06-19 NOTE — ED Triage Notes (Signed)
The patient presents from [facility] with complaints of "not feeling herself". She has been increasingly more tired with general weakness. The facility reports she has not been as active as she usually is. She is A&O x2 at baseline.   HX Dementia, chronic low back pain.    EMS vitals: 96 HR 18 RR 100% O2 sat on room air 160/80 BP 137 CBG 97.6 Temp

## 2021-06-19 NOTE — ED Notes (Signed)
Patients sister would like a call back with an update: Vaughan Basta [phone]

## 2021-06-20 ENCOUNTER — Encounter (HOSPITAL_COMMUNITY): Payer: Self-pay | Admitting: Internal Medicine

## 2021-06-20 ENCOUNTER — Other Ambulatory Visit: Payer: Self-pay

## 2021-06-20 DIAGNOSIS — F03A0 Unspecified dementia, mild, without behavioral disturbance, psychotic disturbance, mood disturbance, and anxiety: Secondary | ICD-10-CM | POA: Diagnosis present

## 2021-06-20 DIAGNOSIS — Z888 Allergy status to other drugs, medicaments and biological substances status: Secondary | ICD-10-CM | POA: Diagnosis not present

## 2021-06-20 DIAGNOSIS — R609 Edema, unspecified: Secondary | ICD-10-CM | POA: Diagnosis not present

## 2021-06-20 DIAGNOSIS — T441X5A Adverse effect of other parasympathomimetics [cholinergics], initial encounter: Secondary | ICD-10-CM | POA: Diagnosis present

## 2021-06-20 DIAGNOSIS — Z794 Long term (current) use of insulin: Secondary | ICD-10-CM

## 2021-06-20 DIAGNOSIS — E669 Obesity, unspecified: Secondary | ICD-10-CM | POA: Diagnosis present

## 2021-06-20 DIAGNOSIS — R531 Weakness: Secondary | ICD-10-CM

## 2021-06-20 DIAGNOSIS — E860 Dehydration: Secondary | ICD-10-CM | POA: Diagnosis present

## 2021-06-20 DIAGNOSIS — R600 Localized edema: Secondary | ICD-10-CM | POA: Diagnosis present

## 2021-06-20 DIAGNOSIS — Z91040 Latex allergy status: Secondary | ICD-10-CM | POA: Diagnosis not present

## 2021-06-20 DIAGNOSIS — E8809 Other disorders of plasma-protein metabolism, not elsewhere classified: Secondary | ICD-10-CM | POA: Diagnosis present

## 2021-06-20 DIAGNOSIS — K5900 Constipation, unspecified: Secondary | ICD-10-CM

## 2021-06-20 DIAGNOSIS — I10 Essential (primary) hypertension: Secondary | ICD-10-CM

## 2021-06-20 DIAGNOSIS — E119 Type 2 diabetes mellitus without complications: Secondary | ICD-10-CM

## 2021-06-20 DIAGNOSIS — E876 Hypokalemia: Secondary | ICD-10-CM | POA: Diagnosis present

## 2021-06-20 DIAGNOSIS — E210 Primary hyperparathyroidism: Secondary | ICD-10-CM | POA: Diagnosis present

## 2021-06-20 DIAGNOSIS — D509 Iron deficiency anemia, unspecified: Secondary | ICD-10-CM | POA: Diagnosis present

## 2021-06-20 DIAGNOSIS — Y92129 Unspecified place in nursing home as the place of occurrence of the external cause: Secondary | ICD-10-CM | POA: Diagnosis not present

## 2021-06-20 DIAGNOSIS — Z20822 Contact with and (suspected) exposure to covid-19: Secondary | ICD-10-CM | POA: Diagnosis present

## 2021-06-20 DIAGNOSIS — T402X5A Adverse effect of other opioids, initial encounter: Secondary | ICD-10-CM | POA: Diagnosis present

## 2021-06-20 DIAGNOSIS — G4733 Obstructive sleep apnea (adult) (pediatric): Secondary | ICD-10-CM | POA: Diagnosis present

## 2021-06-20 DIAGNOSIS — T40425A Adverse effect of tramadol, initial encounter: Secondary | ICD-10-CM | POA: Diagnosis present

## 2021-06-20 DIAGNOSIS — I4510 Unspecified right bundle-branch block: Secondary | ICD-10-CM | POA: Diagnosis present

## 2021-06-20 DIAGNOSIS — E785 Hyperlipidemia, unspecified: Secondary | ICD-10-CM

## 2021-06-20 DIAGNOSIS — E039 Hypothyroidism, unspecified: Secondary | ICD-10-CM | POA: Diagnosis present

## 2021-06-20 DIAGNOSIS — G9341 Metabolic encephalopathy: Principal | ICD-10-CM

## 2021-06-20 DIAGNOSIS — Z6839 Body mass index (BMI) 39.0-39.9, adult: Secondary | ICD-10-CM | POA: Diagnosis not present

## 2021-06-20 DIAGNOSIS — I119 Hypertensive heart disease without heart failure: Secondary | ICD-10-CM | POA: Diagnosis present

## 2021-06-20 DIAGNOSIS — Z885 Allergy status to narcotic agent status: Secondary | ICD-10-CM | POA: Diagnosis not present

## 2021-06-20 DIAGNOSIS — G894 Chronic pain syndrome: Secondary | ICD-10-CM | POA: Diagnosis present

## 2021-06-20 LAB — COMPREHENSIVE METABOLIC PANEL
ALT: 14 U/L (ref 0–44)
AST: 16 U/L (ref 15–41)
Albumin: 3.5 g/dL (ref 3.5–5.0)
Alkaline Phosphatase: 66 U/L (ref 38–126)
Anion gap: 8 (ref 5–15)
BUN: 13 mg/dL (ref 8–23)
CO2: 25 mmol/L (ref 22–32)
Calcium: 10.6 mg/dL — ABNORMAL HIGH (ref 8.9–10.3)
Chloride: 104 mmol/L (ref 98–111)
Creatinine, Ser: 0.88 mg/dL (ref 0.44–1.00)
GFR, Estimated: >60 mL/min (ref >60)
Glucose, Bld: 123 mg/dL — ABNORMAL HIGH (ref 70–99)
Potassium: 4.2 mmol/L (ref 3.5–5.1)
Sodium: 137 mmol/L (ref 135–145)
Total Bilirubin: 0.4 mg/dL (ref 0.3–1.2)
Total Protein: 7.3 g/dL (ref 6.5–8.1)

## 2021-06-20 LAB — LACTIC ACID, PLASMA: Lactic Acid, Venous: 1 mmol/L (ref 0.5–1.9)

## 2021-06-20 LAB — CBC
HCT: 30.3 % — ABNORMAL LOW (ref 36.0–46.0)
Hemoglobin: 9.5 g/dL — ABNORMAL LOW (ref 12.0–15.0)
MCH: 24.7 pg — ABNORMAL LOW (ref 26.0–34.0)
MCHC: 31.4 g/dL (ref 30.0–36.0)
MCV: 78.7 fL — ABNORMAL LOW (ref 80.0–100.0)
Platelets: 269 10*3/uL (ref 150–400)
RBC: 3.85 MIL/uL — ABNORMAL LOW (ref 3.87–5.11)
RDW: 14.8 % (ref 11.5–15.5)
WBC: 7.5 10*3/uL (ref 4.0–10.5)
nRBC: 0 % (ref 0.0–0.2)

## 2021-06-20 LAB — CK: Total CK: 219 U/L (ref 38–234)

## 2021-06-20 LAB — TSH: TSH: 1.393 u[IU]/mL (ref 0.350–4.500)

## 2021-06-20 LAB — GLUCOSE, CAPILLARY
Glucose-Capillary: 131 mg/dL — ABNORMAL HIGH (ref 70–99)
Glucose-Capillary: 136 mg/dL — ABNORMAL HIGH (ref 70–99)

## 2021-06-20 LAB — RAPID URINE DRUG SCREEN, HOSP PERFORMED
Amphetamines: NOT DETECTED
Barbiturates: NOT DETECTED
Benzodiazepines: NOT DETECTED
Cocaine: NOT DETECTED
Opiates: NOT DETECTED
Tetrahydrocannabinol: NOT DETECTED

## 2021-06-20 LAB — CBG MONITORING, ED
Glucose-Capillary: 108 mg/dL — ABNORMAL HIGH (ref 70–99)
Glucose-Capillary: 118 mg/dL — ABNORMAL HIGH (ref 70–99)

## 2021-06-20 LAB — PROTIME-INR
INR: 1.1 (ref 0.8–1.2)
Prothrombin Time: 13.8 seconds (ref 11.4–15.2)

## 2021-06-20 LAB — URINALYSIS, ROUTINE W REFLEX MICROSCOPIC
Bilirubin Urine: NEGATIVE
Glucose, UA: NEGATIVE mg/dL
Ketones, ur: NEGATIVE mg/dL
Leukocytes,Ua: NEGATIVE
Nitrite: NEGATIVE
Protein, ur: NEGATIVE mg/dL
Specific Gravity, Urine: 1.01 (ref 1.005–1.030)
pH: 7 (ref 5.0–8.0)

## 2021-06-20 LAB — AMMONIA: Ammonia: 11 umol/L (ref 9–35)

## 2021-06-20 LAB — BLOOD GAS, VENOUS
Acid-Base Excess: 0.2 mmol/L (ref 0.0–2.0)
Bicarbonate: 25.4 mmol/L (ref 20.0–28.0)
O2 Saturation: 30 %
Patient temperature: 98.6
pCO2, Ven: 46.4 mmHg (ref 44.0–60.0)
pH, Ven: 7.357 (ref 7.250–7.430)
pO2, Ven: <31 mmHg — CL (ref 32.0–45.0)

## 2021-06-20 LAB — URINALYSIS, MICROSCOPIC (REFLEX)
Squamous Epithelial / HPF: NONE SEEN (ref 0–5)
WBC, UA: NONE SEEN WBC/hpf (ref 0–5)

## 2021-06-20 LAB — TROPONIN I (HIGH SENSITIVITY): Troponin I (High Sensitivity): 7 ng/L (ref <18)

## 2021-06-20 LAB — PHOSPHORUS: Phosphorus: 3 mg/dL (ref 2.5–4.6)

## 2021-06-20 LAB — MAGNESIUM: Magnesium: 1.3 mg/dL — ABNORMAL LOW (ref 1.7–2.4)

## 2021-06-20 MED ORDER — LACTATED RINGERS IV BOLUS
500.0000 mL | Freq: Once | INTRAVENOUS | Status: AC
  Administered 2021-06-20: 500 mL via INTRAVENOUS

## 2021-06-20 MED ORDER — POLYETHYLENE GLYCOL 3350 17 G PO PACK: 17.0000 g | PACK | Freq: Every day | ORAL | Status: DC | PRN

## 2021-06-20 MED ORDER — INSULIN ASPART 100 UNIT/ML IJ SOLN
0.0000 [IU] | Freq: Three times a day (TID) | INTRAMUSCULAR | Status: DC
  Administered 2021-06-21: 3 [IU] via SUBCUTANEOUS
  Administered 2021-06-21: 1 [IU] via SUBCUTANEOUS
  Administered 2021-06-22 (×2): 3 [IU] via SUBCUTANEOUS
  Administered 2021-06-22: 1 [IU] via SUBCUTANEOUS
  Administered 2021-06-23: 2 [IU] via SUBCUTANEOUS
  Administered 2021-06-23: 1 [IU] via SUBCUTANEOUS
  Administered 2021-06-23 – 2021-06-24 (×2): 2 [IU] via SUBCUTANEOUS
  Administered 2021-06-24: 3 [IU] via SUBCUTANEOUS
  Administered 2021-06-24: 2 [IU] via SUBCUTANEOUS
  Administered 2021-06-25: 3 [IU] via SUBCUTANEOUS
  Filled 2021-06-20: qty 0.06

## 2021-06-20 MED ORDER — LORAZEPAM 1 MG PO TABS: 1.0000 mg | ORAL_TABLET | Freq: Once | ORAL | Status: DC

## 2021-06-20 MED ORDER — ACETAMINOPHEN 325 MG PO TABS
650.0000 mg | ORAL_TABLET | Freq: Four times a day (QID) | ORAL | Status: DC | PRN
  Administered 2021-06-22 – 2021-06-23 (×2): 650 mg via ORAL
  Filled 2021-06-20 (×3): qty 2

## 2021-06-20 MED ORDER — LACTATED RINGERS IV SOLN: INTRAVENOUS | Status: AC

## 2021-06-20 MED ORDER — AMLODIPINE BESYLATE 10 MG PO TABS
10.0000 mg | ORAL_TABLET | Freq: Every day | ORAL | Status: DC
  Administered 2021-06-20 – 2021-06-25 (×6): 10 mg via ORAL
  Filled 2021-06-20 (×4): qty 1
  Filled 2021-06-20: qty 2
  Filled 2021-06-20 (×2): qty 1

## 2021-06-20 MED ORDER — LORAZEPAM 2 MG/ML IJ SOLN: 0.5000 mg | Freq: Once | INTRAMUSCULAR | Status: AC

## 2021-06-20 MED ORDER — DOCUSATE SODIUM 100 MG PO CAPS
100.0000 mg | ORAL_CAPSULE | Freq: Two times a day (BID) | ORAL | Status: DC
  Administered 2021-06-20 – 2021-06-25 (×11): 100 mg via ORAL
  Filled 2021-06-20 (×11): qty 1

## 2021-06-20 MED ORDER — LORAZEPAM 2 MG/ML IJ SOLN
INTRAMUSCULAR | Status: AC
  Administered 2021-06-20: 0.5 mg via INTRAVENOUS
  Filled 2021-06-20: qty 1

## 2021-06-20 MED ORDER — ACETAMINOPHEN 650 MG RE SUPP: 650.0000 mg | Freq: Four times a day (QID) | RECTAL | Status: DC | PRN

## 2021-06-20 NOTE — Progress Notes (Signed)
PIV consult: Notified that patient removed last USGPIV. Has no IV meds ordered at this time. Communicated with RN to get labs by phlebotomist/EMT for vein preservation purposes. Please note, patient has limited options, last site was dressed securely and wrapped with Kerlix by previous ED RN.

## 2021-06-20 NOTE — ED Notes (Signed)
Mittens placed back on hands. Pt resting comfortably and placed on cardiac monitoring.

## 2021-06-20 NOTE — Progress Notes (Signed)
PROGRESS NOTE    Katelyn [name]  IPJ:[id number] DOB: 08/05/1946 DOA: 06/19/2021 PCP: Sande Brothers, MD   Chief Complaint  Patient presents with   Weakness   Fatigue    Brief Narrative: Patient was admitted to the hospital with the working diagnosis of generalized weakness.    Katelyn [name] is a 75 y.o. female with medical history significant for dementia, type 2 diabetes mellitus, hypertension, hyperlipidemia, chronic pain syndrome, chronic anemia with baseline hemoglobin 9-10, who is admitted to [facility] on 06/19/2021 with acute encephalopathy after presenting from SNF to [facility] ED complaining of generalized weakness.  SNF staff reports that the patient has had generalized weakness over the last 2 to 3 days.  They state the patient was not ambulating as much relative to her baseline ambulatory abilities, and requiring increased assistance with transfers relative to baseline, while noting symmetrical weakness in the absence of any overt acute focal neurologic deficits. They also report worsening confusion and agitation relative to the patient's baseline mental status. No reported associated fever or chills.  No reported recent nausea, vomiting, or diarrhea no reported recent rash, trauma, fall.  No reported generalized tonic-clonic activity.  Chest x-ray showed no evidence of acute cardiopulmonary process.  CT abdomen/pelvis showed moderate volume stool throughout the colon suggestive of constipation, but otherwise demonstrated evidence of acute intra-abdominal or acute intrapelvic process, including no evidence of bowel obstruction, perforation, or abscess.  CT head showed no evidence of acute intracranial process, including no evidence of intracranial hemorrhage.  Assessment & Plan:   Principal Problem:   Generalized weakness Active Problems:   DM2 (diabetes mellitus, type 2) (HCC)   HLD (hyperlipidemia)   Essential hypertension   Acute metabolic encephalopathy    Hypercalcemia   Constipation   #) Generalized weakness -patient has a 3-day history of generalized weakness and a 2-day history of increased confusion/agitation from patient baseline.  Of note patient does have advanced dementia.  No evidence of underlying infectious infectious disease.  Ammonia and total CPK within normal limits.  Patient has no neurological deficits; No overt evidence of acute stroke. CT head showed no evidence of acute intracranial process.   -PT consult -Fall precautions -Methylmalonic acid pending.  Folate ordered. -TSH 1.393 WNL   #) Hypercalcemia-patient calcium elevated at 10.6.  After further review of patient's chart, her calcium has been elevated for the past 3 years; usually 10.1-10.6.  Patient's calcium is chronically elevated.  Patient most recent parathyroid hormone in epic on 07/17/2017 of 193.  Given this finding patient's hypercalcemia is likely primary hyperparathyroidism.  It is difficult to determine if patient's mental status is altered by chronic hypercalcemia given dementia history. -PTH ordered -Continue lactated Ringers 75 ml/hr -will end today at 1844 today.    #) Constipation -CT abdomen pelvis is suggestive of constipation.  Patient currently not endorsing any symptoms concerning for obstruction, abscess or perforation.  Patient does have a history of hypercalcemia increases her risk of developing constipation. -Scheduled Colace twice daily -As needed MiraLAX   #) Type 2 diabetes -Documented history of such, on Lantus 10 units subcu nightly as well as metformin as an outpatient.  -Accu-Cheks before every meal and at bedtime with very low-dose sliding scale insulin. -Hold home metformin -Hold home basal insulin  #) Hyperlipidemia -patient on atorvastatin as an outpatient -Hold statin medication   #) Hypertension -Continue home Norvasc  #) Chronic anemia - baseline hemoglobin noted to be 9-10 -Hemoglobin 9.5 -INR 1.1 WNL   DVT  prophylaxis: SCDs Code Status: Full code  Family Communication: No family at bedside  Disposition:   Status is: Inpatient  Remains inpatient appropriate because: Altered mental status and generalized weakness.       Consultants:  N/A  Procedures:   N/A  Antimicrobials: N/a    Subjective: Patient interviewed laying in her bed.  Patient is oriented x3, although she was somewhat slow to answer.  Patient states that she was brought to the hospital from her nursing facility due to increased weakness and fatigue over the past 2 days.  Patient denies any recent sickness or sick contact sick contact.  Patient any neurological deficits.  Also denies any recent falls or trauma.  Patient does not endorse any recent changes in diet.  She currently denies chest pain, palpitations, headaches, shortness of breath or dizziness.   Objective: Vitals:   06/20/21 0516 06/20/21 0852 06/20/21 0930 06/20/21 1000  BP: (!) 127/93 107/87 (!) 161/87 (!) 149/107  Pulse: (!) 109 (!) 102 100 (!) 110  Resp: 20 19  19   Temp:      TempSrc:      SpO2: 100% 100% 96% 100%    Intake/Output Summary (Last 24 hours) at 06/20/2021 1043 Last data filed at 06/20/2021 0342 Gross per 24 hour  Intake 300 ml  Output --  Net 300 ml   There were no vitals filed for this visit.  Examination:  General exam: Appears calm and comfortable  Respiratory system: Clear to auscultation. Respiratory effort normal. Cardiovascular system: S1 & S2 heard, RRR. No JVD, murmurs, rubs, gallops or clicks.  +1 edema in right leg.  No edema in left leg. gastrointestinal system: Abdomen is nondistended, soft and nontender. No organomegaly or masses felt. Normal bowel sounds heard. Central nervous system: Alert and oriented. No focal neurological deficits. Extremities: Symmetric 5 x 5 power. Skin: No rashes, lesions or ulcers Psychiatry: Judgement and insight appear impaired, although patient was oriented x3.  Mood & affect  appropriate.     Data Reviewed: I have personally reviewed following labs and imaging studies  CBC: Recent Labs  Lab 06/19/21 2220 06/20/21 0515  WBC 8.2 7.5  NEUTROABS 5.2  --   HGB 9.4* 9.5*  HCT 29.6* 30.3*  MCV 77.9* 78.7*  PLT 263 354    Basic Metabolic Panel: Recent Labs  Lab 06/19/21 2220 06/20/21 0515  NA 136 137  K 4.3 4.2  CL 107 104  CO2 23 25  GLUCOSE 117* 123*  BUN 16 13  CREATININE 0.92 0.88  CALCIUM 10.6* 10.6*  MG  --  1.3*  PHOS  --  3.0    GFR: CrCl cannot be calculated (Unknown ideal weight.).  Liver Function Tests: Recent Labs  Lab 06/19/21 2220 06/20/21 0515  AST 16 16  ALT 13 14  ALKPHOS 62 66  BILITOT 0.5 0.4  PROT 7.4 7.3  ALBUMIN 3.5 3.5    CBG: Recent Labs  Lab 06/20/21 0816  GLUCAP 108*     Recent Results (from the past 240 hour(s))  Resp Panel by RT-PCR (Flu A&B, Covid) Nasopharyngeal Swab     Status: None   Collection Time: 06/19/21  4:57 PM   Specimen: Nasopharyngeal Swab; Nasopharyngeal(NP) swabs in vial transport medium  Result Value Ref Range Status   SARS Coronavirus 2 by RT PCR NEGATIVE NEGATIVE Final    Comment: (NOTE) SARS-CoV-2 target nucleic acids are NOT DETECTED.  The SARS-CoV-2 RNA is generally detectable in upper respiratory specimens during the acute phase of  infection. The lowest concentration of SARS-CoV-2 viral copies this assay can detect is 138 copies/mL. A negative result does not preclude SARS-Cov-2 infection and should not be used as the sole basis for treatment or other patient management decisions. A negative result may occur with  improper specimen collection/handling, submission of specimen other than nasopharyngeal swab, presence of viral mutation(s) within the areas targeted by this assay, and inadequate number of viral copies(<138 copies/mL). A negative result must be combined with clinical observations, patient history, and epidemiological information. The expected result is  Negative.  Fact Sheet for Patients:  EntrepreneurPulse.com.au  Fact Sheet for Healthcare Providers:  IncredibleEmployment.be  This test is no t yet approved or cleared by the [place] FDA and  has been authorized for detection and/or diagnosis of SARS-CoV-2 by FDA under an Emergency Use Authorization (EUA). This EUA will remain  in effect (meaning this test can be used) for the duration of the COVID-19 declaration under Section 564(b)(1) of the Act, 21 U.S.C.section 360bbb-3(b)(1), unless the authorization is terminated  or revoked sooner.       Influenza A by PCR NEGATIVE NEGATIVE Final   Influenza B by PCR NEGATIVE NEGATIVE Final    Comment: (NOTE) The Xpert Xpress SARS-CoV-2/FLU/RSV plus assay is intended as an aid in the diagnosis of influenza from Nasopharyngeal swab specimens and should not be used as a sole basis for treatment. Nasal washings and aspirates are unacceptable for Xpert Xpress SARS-CoV-2/FLU/RSV testing.  Fact Sheet for Patients: EntrepreneurPulse.com.au  Fact Sheet for Healthcare Providers: IncredibleEmployment.be  This test is not yet approved or cleared by the [place] FDA and has been authorized for detection and/or diagnosis of SARS-CoV-2 by FDA under an Emergency Use Authorization (EUA). This EUA will remain in effect (meaning this test can be used) for the duration of the COVID-19 declaration under Section 564(b)(1) of the Act, 21 U.S.C. section 360bbb-3(b)(1), unless the authorization is terminated or revoked.  Performed at [facility], [place] [address] [place], [place] [address]          Radiology Studies: CT Abdomen Pelvis Wo Contrast  Result Date: 06/19/2021 CLINICAL DATA:  Increasing lethargy weakness. Acute nonlocalized abdominal pain. EXAM: CT ABDOMEN AND PELVIS WITHOUT CONTRAST TECHNIQUE: Multidetector CT imaging of the abdomen and  pelvis was performed following the standard protocol without IV contrast. COMPARISON:  June 29, 2018 FINDINGS: Lower chest: Bibasilar atelectasis. Hepatobiliary: Unremarkable noncontrast appearance of the hepatic parenchyma. Gallbladder is unremarkable. No biliary ductal dilation. Pancreas: No pancreatic ductal dilation or evidence of acute inflammation. Spleen: Within normal limits. Adrenals/Urinary Tract: Bilateral adrenal glands are unremarkable. No hydronephrosis. No renal, ureteral or bladder calculi visualized. Urinary bladder is unremarkable for degree of distension. Stomach/Bowel: No enteric contrast was administered. Stomach is predominantly decompressed limiting evaluation. No pathologic dilation large or small bowel. The appendix and terminal ileum appear normal. No evidence of acute bowel inflammation. Moderate volume formed stool throughout the colon. Vascular/Lymphatic: Aortic and branch vessel atherosclerosis without abdominal aortic aneurysm. No pathologically enlarged abdominal or pelvic lymph nodes. Reproductive: Calcified uterine leiomyomas. No suspicious adnexal mass. Other: No significant abdominopelvic free fluid. Small fat containing periumbilical hernia. Musculoskeletal: Anterior wedging deformity of the L2 vertebral body is unchanged from prior. Multilevel degenerative changes spine. No acute osseous abnormality. IMPRESSION: Study is degraded by motion, within this context: 1. Acute abdominopelvic findings. 2. Moderate volume formed stool throughout the colon. Correlate for constipation. 3. Aortic Atherosclerosis (ICD10-I70.0). Electronically Signed   By: Andree Moro.D.  On: 06/19/2021 18:09   DG Chest 2 View  Result Date: 06/19/2021 CLINICAL DATA:  Weakness EXAM: CHEST - 2 VIEW COMPARISON:  08/31/2017 FINDINGS: The heart size and mediastinal contours are within normal limits. Both lungs are clear. The visualized skeletal structures are unremarkable. IMPRESSION: No active  cardiopulmonary disease. Electronically Signed   By: Randa Ngo M.D.   On: 06/19/2021 18:02   CT Head Wo Contrast  Result Date: 06/19/2021 CLINICAL DATA:  Altered mental status EXAM: CT HEAD WITHOUT CONTRAST TECHNIQUE: Contiguous axial images were obtained from the base of the skull through the vertex without intravenous contrast. COMPARISON:  07/15/2017 FINDINGS: Brain: No evidence of acute infarction, hemorrhage, hydrocephalus, extra-axial collection or mass lesion/mass effect. Subcortical white matter and periventricular small vessel ischemic changes. Vascular: No hyperdense vessel or unexpected calcification. Skull: Normal. Negative for fracture or focal lesion. Sinuses/Orbits: The visualized paranasal sinuses are essentially clear. The mastoid air cells are unopacified. Other: None. IMPRESSION: No evidence of acute intracranial abnormality. Small vessel ischemic changes. Electronically Signed   By: Julian Hy M.D.   On: 06/19/2021 23:40        Scheduled Meds:  amLODipine  10 mg Oral Daily   docusate sodium  100 mg Oral BID   insulin aspart  0-6 Units Subcutaneous TID WC   Continuous Infusions:  lactated ringers 75 mL/hr at 06/20/21 0700     LOS: 0 days    Time spent: [address]    Gerrit Halls, MS4  06/20/2021, 10:43 AM

## 2021-06-20 NOTE — ED Provider Notes (Signed)
Patient was signed out to me by Dr. Tamera Punt to follow-up on CT head and urinalysis.  Patient was sent to the emergency department for evaluation of back pain, agitation.  Patient does have a history of dementia.  Patient cannot tell me why she is here.  Patient noted to be tachycardic.  She was receiving IV fluids but pulled out her IV secondary to her agitation.  Head CT was reviewed and is negative.  Urinalysis does not show any signs of infection.  Patient has been monitored and has continued to decline.  Patient now with worsening agitation and mental status decline.  Etiology unclear at this time.  Continues to be tachycardic.  Troponins are negative.  EKG without other changes.  We will add on blood culture, ammonia, CPK, lactic acid.  Due to her decline, will recommend hospitalization.  Angiocath insertion Performed by: Orpah Greek  Consent: Verbal consent obtained. Risks and benefits: risks, benefits and alternatives were discussed Time out: Immediately prior to procedure a "time out" was called to verify the correct patient, procedure, equipment, support staff and site/side marked as required.  Preparation: Patient was prepped and draped in the usual sterile fashion.  Vein Location: L antecub    Ultrasound Guided  Gauge: 20  Normal blood return and flush without difficulty Patient tolerance: Patient tolerated the procedure well with no immediate complications.     Orpah Greek, MD 06/20/21 [phone]

## 2021-06-20 NOTE — ED Notes (Signed)
Patient called out stating she had possibly urinated on herself. Upon entering the room, patient has pulled out her IV again. Bed noted to be wet from IV fluids, large puddle on floor. Patient's gown and bed linens changed.

## 2021-06-20 NOTE — ED Notes (Addendum)
IV team stating they will not come down and that patient needs to be stuck by main lab or ER phlebotomy. Multiple attempts by staff earlier were unsuccessful. Called main lab phlebotomy, who states they cannot come down due to morning labs. MD aware, will continue to monitor. Patient continuing to remove monitoring equipment despite

## 2021-06-20 NOTE — ED Notes (Signed)
Deedee, Scientist, physiological at [facility], called for an update on pt. Deedee said if the pt is admitted today, she will be unable to return to [facility] until Monday, because their discharge planner does not work on the weekends. Deedee's number is [phone].

## 2021-06-20 NOTE — H&P (Signed)
History and Physical    PLEASE NOTE THAT DRAGON DICTATION SOFTWARE WAS USED IN THE CONSTRUCTION OF THIS NOTE.   Katelyn [name] LOV:[id number] DOB: Aug 20, 1946 DOA: 06/19/2021  PCP: Sande Brothers, MD Patient coming from: snf  I have personally briefly reviewed patient's old medical records in [place]  Chief Complaint: Generalized weakness  HPI: Katelyn [name] is a 75 y.o. female with medical history significant for dementia, type 2 diabetes mellitus, hypertension, hyperlipidemia, chronic pain syndrome, chronic anemia with baseline hemoglobin 9-10, who is admitted to [facility] on 06/19/2021 with acute encephalopathy after presenting from SNF to [facility] ED complaining of generalized weakness.   In the setting of suspected acute encephalopathy superimposed on known advanced dementia and patient's associated limited ability to provide history, the following history is provided via SNF staff, my discussions with the EDP, and via chart review.  SNF staff contacted EMS to bring patient to [facility] long emergency department today for further evaluation of generalized weakness over the last 2 to 3 days, noting that the patient was not ambulating as much relative to her baseline ambulatory abilities, and requiring increased assistance with transfers relative to baseline, while noting symmetrical weakness in the absence of any overt acute focal neurologic deficits.  We also reportedly noted worsening confusion and agitation relative to the patient's baseline mental status, will noting baseline dementia.  No reported associated fever or chills.  No reported recent nausea, vomiting, or diarrhea no reported recent rash, trauma, fall.  No reported generalized tonic-clonic activity.     ED Course:  Vital signs in the ED were notable for the following: Afebrile, heart rate 99-1 09; blood pressure 127/93 -155/85; respiratory rate 16-27, oxygen saturation 96 to 100% on room air.  Labs were  notable for the following: CMP notable for the following: Sodium 136, bicarbonate 23, anion gap 6, creatinine 0.92, glucose 117, calcium corrected for hypoalbuminemia noted to be 11.0, albumin 3.5, otherwise liver enzymes were found to be within normal limits.  Ammonia 11.  CPK 219.  High-sensitivity troponin I x2 values, both of which were found to be 7.  Lactic acid 1.0.  CBC notable for white cell count 8200, hemoglobin 9.4 compared to most recent prior value of 10.1 in October 2021.  Urinalysis notable for no white blood cells, leukocyte Estrace negative, nitrate negative.  COVID-19/influenza PCR were checked in the ED today and found to be negative.  Blood cultures x1 sample were collected.  Imaging and additional notable ED work-up: EKG was obtained, although the interpretation of such is limited by the presence of significant artifact, but within these confines, appears to demonstrate sinus tachycardia with heart rate 111, normal intervals, and no evidence of T wave or ST changes, including no overt evidence of ST elevation.  Chest x-ray showed no evidence of acute cardiopulmonary process.  CT abdomen/pelvis showed moderate volume stool throughout the colon suggestive of constipation, but otherwise demonstrated evidence of acute intra-abdominal or acute intrapelvic process, including no evidence of bowel obstruction, perforation, or abscess.  CT head showed no evidence of acute intracranial process, including no evidence of intracranial hemorrhage.  While in the ED, the following were administered: Ativan 0.5 mg IV x1, normal saline x1 L bolus followed by LR bolus x500 cc per associated with the patient was admitted to the med telemetry unit for further evaluation management of presenting generalized weakness as well as acute encephalopathy superimposed on dementia.      Review of Systems: As per HPI  otherwise 10 point review of systems negative.   Past Medical History:  Diagnosis Date   Allergic  rhinitis    Asthma    Chronic pain syndrome    Colon polyp    Diabetes mellitus    H. pylori infection    Hyperlipidemia    Hyperparathyroidism ([place])    Hypertension    Hypertensive heart disease without heart failure    Morbid obesity ([place])    Sleep apnea    Vitamin D deficiency     Past Surgical History:  Procedure Laterality Date   INSERTION OF MESH N/A 06/29/2018   Procedure: INSERTION OF MESH;  Surgeon: Erroll Luna, MD;  Location: [place];  Service: General;  Laterality: N/A;   JOINT REPLACEMENT     UMBILICAL HERNIA REPAIR  63/[id number]/9702   UMBILICAL HERNIA REPAIR N/A 06/29/2018   Procedure: HERNIA REPAIR UMBILICAL ADULT, incarcerated,;  Surgeon: Erroll Luna, MD;  Location: [place];  Service: General;  Laterality: N/A;    Social History:  reports that she has never smoked. She has never used smokeless tobacco. She reports that she does not drink alcohol and does not use drugs.   Allergies  Allergen Reactions   Aspirin Other (See Comments)    Stomach irritation   Hydrochlorothiazide     REACTION: 2 trials of HCTZ with pt intolerance secondary to dizzines   Ibuprofen Other (See Comments)     Causes "shakiness"   Latex Itching    Irritates, Redness   Tramadol     Stomach upset    Family History  Problem Relation Age of Onset   Prostate cancer Father     Family history reviewed and not pertinent    Prior to Admission medications   Medication Sig Start Date End Date Taking? Authorizing Provider  acetaminophen (TYLENOL) 500 MG tablet Take 500 mg by mouth every 4 (four) hours as needed for headache. Do not exceed 2000mg    Yes [provider]  alum & mag hydroxide-simeth (MAALOX/MYLANTA) 200-200-20 MG/5ML suspension Take 30 mLs by mouth every 6 (six) hours as needed for indigestion or heartburn.   Yes [provider]  amLODipine (NORVASC) 10 MG tablet Take 10 mg by mouth daily.    Yes [provider]  aspirin 81 MG chewable tablet Chew  81 mg by mouth daily.   Yes [provider]  [employer] D3 5000 units capsule Take 5,000 Units by mouth daily. 06/09/18  Yes [provider]  febuxostat (ULORIC) 40 MG tablet Take 40 mg by mouth daily.   Yes [provider]  guaifenesin (ROBITUSSIN) 100 MG/5ML syrup Take 200 mg by mouth every 6 (six) hours as needed for cough.   Yes [provider]  LANTUS SOLOSTAR 100 UNIT/ML Solostar Pen Inject 10 Units into the skin every evening. 03/07/21  Yes [provider]  loperamide (IMODIUM) 2 MG capsule Take 2 mg by mouth as needed for diarrhea or loose stools.   Yes [provider]  magnesium hydroxide (MILK OF MAGNESIA) 400 MG/5ML suspension Take 30 mLs by mouth at bedtime as needed for mild constipation.   Yes [provider]  metFORMIN (GLUCOPHAGE) 1000 MG tablet Take 1,000 mg by mouth 2 (two) times daily with a meal.   Yes [provider]  mometasone (NASONEX) 50 MCG/ACT nasal spray Place 2 sprays into the nose daily.    Yes [provider]  neomycin-bacitracin-polymyxin (NEOSPORIN) [phone] ointment Apply 1 application topically 4 (four) times daily as needed (minor skin tears/abrasions).  Yes [provider]  potassium chloride (K-DUR) 10 MEQ tablet Take 1 tablet (10 mEq total) daily by mouth. 07/17/17  Yes Hall, Carole N, DO  rivastigmine (EXELON) 1.5 MG capsule Take 1.5 mg by mouth 2 (two) times daily.   Yes [provider]  rosuvastatin (CRESTOR) 5 MG tablet Take 5 mg by mouth at bedtime. 06/09/18  Yes [provider]  traMADol (ULTRAM) 50 MG tablet Take 50 mg by mouth 3 (three) times daily.   Yes [provider]  albuterol (PROVENTIL HFA;VENTOLIN HFA) 108 (90 BASE) MCG/ACT inhaler Inhale 2 puffs into the lungs 4 (four) times daily.  Patient not taking: Reported on 06/20/2021    [provider]  aspirin EC 81 MG tablet Take 81 mg by mouth daily.  Patient not taking: Reported on  06/20/2021    [provider]  b complex vitamins capsule Take 1 capsule by mouth daily. Patient not taking: Reported on 06/20/2021    [provider]  diclofenac sodium (VOLTAREN) 1 % GEL Apply 1 application topically 4 (four) times daily as needed (back pain).  Patient not taking: Reported on 06/20/2021 10/30/16   [provider]  esomeprazole (NEXIUM) 40 MG capsule Take 40 mg by mouth daily. Patient not taking: Reported on 06/20/2021 06/21/18   [provider]  famotidine (PEPCID) 20 MG tablet Take 20 mg by mouth 2 (two) times daily. Patient not taking: Reported on 06/20/2021    [provider]  hydrochlorothiazide (HYDRODIURIL) 25 MG tablet Take 25 mg by mouth daily. Patient not taking: Reported on 06/20/2021 06/09/18   [provider]  HYDROcodone-acetaminophen (NORCO) 5-325 MG tablet Take 1 tablet by mouth every 6 (six) hours as needed (for pain). Patient not taking: Reported on 06/20/2021 09/07/18   Molpus, Jenny Reichmann, MD  oxyCODONE (OXY IR/ROXICODONE) 5 MG immediate release tablet Take 2 tablets (10 mg total) by mouth every 6 (six) hours as needed for moderate pain. Patient not taking: Reported on 06/20/2021 06/30/18   Jill Alexanders, PA-C  polyethylene glycol [facility] / GLYCOLAX) packet Take 17 g by mouth daily as needed for mild constipation. Patient not taking: Reported on 06/20/2021 06/30/18   Jill Alexanders, PA-C     Objective    Physical Exam: Vitals:   06/20/21 0015 06/20/21 0138 06/20/21 0337 06/20/21 0516  BP: (!) 135/115 (!) 132/106 (!) 149/108 (!) 127/93  Pulse: (!) 111 (!) 105 (!) 112 (!) 109  Resp: 18 20 16 20   Temp:      TempSrc:      SpO2: 100% 100% 100% 100%    General: appears to be stated age; confused, Skin: warm, dry, no rash Head:  AT/[place] Mouth:  Oral mucosa membranes appear dry, normal dentition Neck: supple; trachea midline Heart:  RRR; did not appreciate any M/R/G Lungs: CTAB, did not  appreciate any wheezes, rales, or rhonchi Abdomen: + BS; soft, ND,  Vascular: 2+ pedal pulses b/l; 2+ radial pulses b/l Extremities: no peripheral edema, no muscle wasting Neuro:  In the setting of the patient's current mental status and associated inability to follow instructions, unable to perform full neurologic exam at this time.  As such, assessment of strength, sensation, and cranial nerves is limited at this time. Patient noted to spontaneously move all 4 extremities. No tremors.      Labs on Admission: I have personally reviewed following labs and imaging studies  CBC: Recent Labs  Lab 06/19/21 2220  WBC 8.2  NEUTROABS 5.2  HGB 9.4*  HCT 29.6*  MCV 77.9*  PLT 157   Basic Metabolic Panel: Recent Labs  Lab 06/19/21 2220  NA 136  K 4.3  CL 107  CO2 23  GLUCOSE 117*  BUN 16  CREATININE 0.92  CALCIUM 10.6*   GFR: CrCl cannot be calculated (Unknown ideal weight.). Liver Function Tests: Recent Labs  Lab 06/19/21 2220  AST 16  ALT 13  ALKPHOS 62  BILITOT 0.5  PROT 7.4  ALBUMIN 3.5   Recent Labs  Lab 06/19/21 2220  LIPASE 32   Recent Labs  Lab 06/20/21 0515  AMMONIA 11   Coagulation Profile: No results for input(s): INR, PROTIME in the last 168 hours. Cardiac Enzymes: Recent Labs  Lab 06/20/21 0515  CKTOTAL 219   BNP (last 3 results) No results for input(s): PROBNP in the last 8760 hours. HbA1C: No results for input(s): HGBA1C in the last 72 hours. CBG: No results for input(s): GLUCAP in the last 168 hours. Lipid Profile: No results for input(s): CHOL, HDL, LDLCALC, TRIG, CHOLHDL, LDLDIRECT in the last 72 hours. Thyroid Function Tests: No results for input(s): TSH, T4TOTAL, FREET4, T3FREE, THYROIDAB in the last 72 hours. Anemia Panel: No results for input(s): VITAMINB12, FOLATE, FERRITIN, TIBC, IRON, RETICCTPCT in the last 72 hours. Urine analysis:    Component Value Date/Time   COLORURINE YELLOW 06/19/2021 2306   APPEARANCEUR CLEAR  06/19/2021 2306   LABSPEC 1.010 06/19/2021 2306   PHURINE 7.0 06/19/2021 2306   GLUCOSEU NEGATIVE 06/19/2021 2306   HGBUR TRACE (A) 06/19/2021 2306   HGBUR negative 12/23/2009 1008   BILIRUBINUR NEGATIVE 06/19/2021 2306   KETONESUR NEGATIVE 06/19/2021 2306   PROTEINUR NEGATIVE 06/19/2021 2306   UROBILINOGEN 0.2 04/15/2011 0951   NITRITE NEGATIVE 06/19/2021 2306   LEUKOCYTESUR NEGATIVE 06/19/2021 2306    Radiological Exams on Admission: CT Abdomen Pelvis Wo Contrast  Result Date: 06/19/2021 CLINICAL DATA:  Increasing lethargy weakness. Acute nonlocalized abdominal pain. EXAM: CT ABDOMEN AND PELVIS WITHOUT CONTRAST TECHNIQUE: Multidetector CT imaging of the abdomen and pelvis was performed following the standard protocol without IV contrast. COMPARISON:  June 29, 2018 FINDINGS: Lower chest: Bibasilar atelectasis. Hepatobiliary: Unremarkable noncontrast appearance of the hepatic parenchyma. Gallbladder is unremarkable. No biliary ductal dilation. Pancreas: No pancreatic ductal dilation or evidence of acute inflammation. Spleen: Within normal limits. Adrenals/Urinary Tract: Bilateral adrenal glands are unremarkable. No hydronephrosis. No renal, ureteral or bladder calculi visualized. Urinary bladder is unremarkable for degree of distension. Stomach/Bowel: No enteric contrast was administered. Stomach is predominantly decompressed limiting evaluation. No pathologic dilation large or small bowel. The appendix and terminal ileum appear normal. No evidence of acute bowel inflammation. Moderate volume formed stool throughout the colon. Vascular/Lymphatic: Aortic and branch vessel atherosclerosis without abdominal aortic aneurysm. No pathologically enlarged abdominal or pelvic lymph nodes. Reproductive: Calcified uterine leiomyomas. No suspicious adnexal mass. Other: No significant abdominopelvic free fluid. Small fat containing periumbilical hernia. Musculoskeletal: Anterior wedging deformity of the L2  vertebral body is unchanged from prior. Multilevel degenerative changes spine. No acute osseous abnormality. IMPRESSION: Study is degraded by motion, within this context: 1. Acute abdominopelvic findings. 2. Moderate volume formed stool throughout the colon. Correlate for constipation. 3. Aortic Atherosclerosis (ICD10-I70.0). Electronically Signed   By: Dahlia Bailiff M.D.   On: 06/19/2021 18:09   DG Chest 2 View  Result Date: 06/19/2021 CLINICAL DATA:  Weakness EXAM: CHEST - 2 VIEW COMPARISON:  08/31/2017 FINDINGS: The heart size and mediastinal contours are within normal limits. Both lungs are clear. The visualized skeletal structures  are unremarkable. IMPRESSION: No active cardiopulmonary disease. Electronically Signed   By: Randa Ngo M.D.   On: 06/19/2021 18:02   CT Head Wo Contrast  Result Date: 06/19/2021 CLINICAL DATA:  Altered mental status EXAM: CT HEAD WITHOUT CONTRAST TECHNIQUE: Contiguous axial images were obtained from the base of the skull through the vertex without intravenous contrast. COMPARISON:  07/15/2017 FINDINGS: Brain: No evidence of acute infarction, hemorrhage, hydrocephalus, extra-axial collection or mass lesion/mass effect. Subcortical white matter and periventricular small vessel ischemic changes. Vascular: No hyperdense vessel or unexpected calcification. Skull: Normal. Negative for fracture or focal lesion. Sinuses/Orbits: The visualized paranasal sinuses are essentially clear. The mastoid air cells are unopacified. Other: None. IMPRESSION: No evidence of acute intracranial abnormality. Small vessel ischemic changes. Electronically Signed   By: Julian Hy M.D.   On: 06/19/2021 23:40     EKG: Independently reviewed, with result as described above.    Assessment/Plan     Principal Problem:   Generalized weakness Active Problems:   DM2 (diabetes mellitus, type 2) (HCC)   HLD (hyperlipidemia)   Essential hypertension   Acute metabolic encephalopathy    Hypercalcemia   Constipation     #) Generalized weakness:The patient was brought to the ED for further evaluation due to 3 days of progressive generalized without any overt evidence of acute focal neurologic deficits to suggest acute stroke, while presenting CT head shows no evidence of acute intracranial process.  No evidence of underlying infectious process at this time, including urinalysis showing no evidence of UTI, will present chest x-ray shows no evidence of acute cardiopulmonary process and tests COVID-19/influenza PCR found to be negative.  Does not meet criteria for sepsis.  Differential includes contributions from mild dehydration as well as hypercalcemia, as further detailed below.  Will expand work-up to further assess MMA and TSH.  Once patient's mental status improves sufficiently that she is able to follow instructions, may benefit from physical therapy consult.  Plan: Fall precautions.  Check MMA, TSH.  Repeat CMP and CBC in the morning.  Further evaluation management of presenting hypercalcemia, including checking of ionized calcium, continuous IV fluids.  Check serum phosphorus level.  Consider PT consult once mental status improves socially such that the patient is able to follow instructions.       #) Acute metabolic encephalopathy: 1 to 2 days of confusion/agitation relative to patient's baseline mental status at which she is noted to have advanced dementia.  Stated above, no evidence of underlying infectious contribution at this time, criteria for sepsis not currently made.  Additionally, ammonia and total CPK found to be nonelevated.  No overt evidence of acute stroke, and clinically, seizures appear to be less likely as well.  Of note, CT head showed no evidence of acute intracranial process.  At this time, differential appears to favor metabolic contributions, including the presence of hypercalcemia per presenting labs, as further detailed above.  There may also be  pharmacologic contributions from patient's outpatient medication regimen, including the presence of scheduled tramadol on a 3 times daily basis.  We will attempt to hold central acting medications for now to limit this potential confounding/contributory variable.   Plan: fall precautions. Repeat CMP/CBC in the AM. Check magnesium level. check VBG, TSH, MMA.  Further evaluation management of hypercalcemia, including IV fluids, serum phosphorus level, and checking of ionized calcium level.  Hold tramadol and rosuvastatin.      #) Hypercalcemia: Presenting calcium corrected for hypoalbuminemia noted to be 11.0.  We will attempt  additional chart review to evaluate the chronicity of this finding, particularly given there is some documentation in her chart of a history of hyperparathyroidism, although I have not encountered if this is primary hyperparathyroidism, and what extent this is been previously evaluated.  There does appear to be an element of dehydration.  We will provide gentle continuous IV fluids, and repeat serum calcium level, with plan to expand work-up for hypercalcemia, if no significant interval helpful improvement in serum calcium level following these rehydration efforts.  Of note, patient is on daily D3 supplementation as an outpatient, which will be held for now.  Plan: Lactated Ringer's at 75 cc/h x 12 hours.  Check ionized calcium level as well as serum phosphorus level.  Repeat CMP in the morning.  Hold home vitamin D3.       #) Constipation: Presenting CT abdomen/pelvis is suggestive of constipation in the absence of any evidence of bowel obstruction, abscess, or perforation.  Potentially could be contributing to her agitation and acute encephalopathy superimposed on dementia.  Presenting hypercalcemia is also a risk factor for development of constipation.  Plan: I placed an order to initiate scheduled twice daily Colace as well as as needed MiraLAX.  Further evaluation  management of presenting hypercalcemia, as above.  IV fluids, as above.  Monitor strict I's and O's and daily weights.     #) Type 2 diabetes mellitus: Documented history of such, on Lantus 10 units subcu nightly as well as metformin as an outpatient.  Present blood sugar noted to be 117.  Plan: We will hold home basal insulin for now as well as outpatient metformin during this hospitalization.  Accu-Cheks before every meal and at bedtime with very low-dose sliding scale insulin.     #) Hyperlipidemia: On rosuvastatin as an outpatient.  Plan: As the statin class of medications can be associated with encephalopathic consequences, will hold home statin for now.      #) Essential pretension: Documented history of such, on Norvasc as an outpatient.  Systolic blood pressures in the ED noted to be in the 120s to 150s, without evidence of hypotension.  Plan: Continue home Norvasc.       #) Chronic anemia: Documented history of such, with baseline hemoglobin noted to be 9-10.  CBC reflects present hemoglobin consistent with this range.  No evidence of acute bleed at this time.  Plan: Repeat CBC in the morning.  Check INR.     DVT prophylaxis: scd's   Code Status: Full code Family Communication: none Disposition Plan: Per Rounding Team Consults called: none;  Admission status:      Of note, this patient was added by me to the following Admit List/Treatment Team: [email or web address].    Of note, the Adult Admission Order Set (Multimorbid order set) was used by me in the admission process for this patient.   PLEASE NOTE THAT DRAGON DICTATION SOFTWARE WAS USED IN THE CONSTRUCTION OF THIS NOTE.   Rhetta Mura DO [facility] Pager [phone] From [place]   06/20/2021, 6:14 AM

## 2021-06-20 NOTE — Plan of Care (Signed)

## 2021-06-20 NOTE — Progress Notes (Signed)
Pt arrived to unit from ED.  Sister, [name], with pt at bedside.  Per Vaughan Basta she is POA and does not want [place] to receive ANY information regarding patients care while in the hospital.  Linda's number is in the chart.  Does not want any other family to call and get information on patient. RN stated a daughter, April, was also in [place] stated to please only call Vaughan Basta for anything and updates.  Linda requesting CM/SW help- does not want pt to return to [facility].

## 2021-06-20 NOTE — ED Notes (Signed)
Pt given breakfast tray

## 2021-06-20 NOTE — ED Notes (Signed)
Staff unable to successfully draw labs and place an IV. MD notified. IV team consult placed.

## 2021-06-21 ENCOUNTER — Inpatient Hospital Stay (HOSPITAL_COMMUNITY): Payer: PPO

## 2021-06-21 DIAGNOSIS — G9341 Metabolic encephalopathy: Secondary | ICD-10-CM | POA: Diagnosis not present

## 2021-06-21 DIAGNOSIS — R609 Edema, unspecified: Secondary | ICD-10-CM | POA: Diagnosis not present

## 2021-06-21 DIAGNOSIS — K5900 Constipation, unspecified: Secondary | ICD-10-CM | POA: Diagnosis not present

## 2021-06-21 DIAGNOSIS — I10 Essential (primary) hypertension: Secondary | ICD-10-CM | POA: Diagnosis not present

## 2021-06-21 DIAGNOSIS — R531 Weakness: Secondary | ICD-10-CM | POA: Diagnosis not present

## 2021-06-21 LAB — BASIC METABOLIC PANEL
Anion gap: 6 (ref 5–15)
BUN: 11 mg/dL (ref 8–23)
CO2: 21 mmol/L — ABNORMAL LOW (ref 22–32)
Calcium: 10.5 mg/dL — ABNORMAL HIGH (ref 8.9–10.3)
Chloride: 108 mmol/L (ref 98–111)
Creatinine, Ser: 0.77 mg/dL (ref 0.44–1.00)
GFR, Estimated: >60 mL/min (ref >60)
Glucose, Bld: 171 mg/dL — ABNORMAL HIGH (ref 70–99)
Potassium: 3.5 mmol/L (ref 3.5–5.1)
Sodium: 135 mmol/L (ref 135–145)

## 2021-06-21 LAB — GLUCOSE, CAPILLARY
Glucose-Capillary: 140 mg/dL — ABNORMAL HIGH (ref 70–99)
Glucose-Capillary: 130 mg/dL — ABNORMAL HIGH (ref 70–99)
Glucose-Capillary: 289 mg/dL — ABNORMAL HIGH (ref 70–99)
Glucose-Capillary: 199 mg/dL — ABNORMAL HIGH (ref 70–99)
Glucose-Capillary: 206 mg/dL — ABNORMAL HIGH (ref 70–99)

## 2021-06-21 LAB — BLOOD GAS, ARTERIAL
Acid-base deficit: 0.7 mmol/L (ref 0.0–2.0)
Bicarbonate: 22.5 mmol/L (ref 20.0–28.0)
O2 Saturation: 98.3 %
Patient temperature: 98.6
pCO2 arterial: 33 mmHg (ref 32.0–48.0)
pH, Arterial: 7.448 (ref 7.350–7.450)
pO2, Arterial: 99.2 mmHg (ref 83.0–108.0)

## 2021-06-21 LAB — FOLATE: Folate: 16.8 ng/mL (ref >5.9)

## 2021-06-21 MED ORDER — TAB-A-VITE/IRON PO TABS
1.0000 | ORAL_TABLET | Freq: Every day | ORAL | Status: DC
  Administered 2021-06-21 – 2021-06-25 (×5): 1 via ORAL
  Filled 2021-06-21 (×5): qty 1

## 2021-06-21 MED ORDER — SODIUM CHLORIDE 0.9 % IV SOLN: INTRAVENOUS | Status: DC

## 2021-06-21 MED ORDER — THIAMINE HCL 100 MG PO TABS
250.0000 mg | ORAL_TABLET | Freq: Two times a day (BID) | ORAL | Status: DC
  Administered 2021-06-21 – 2021-06-25 (×9): 250 mg via ORAL
  Filled 2021-06-21 (×9): qty 3

## 2021-06-21 MED ORDER — PANTOPRAZOLE SODIUM 40 MG PO TBEC
40.0000 mg | DELAYED_RELEASE_TABLET | Freq: Every day | ORAL | Status: DC
  Administered 2021-06-21 – 2021-06-25 (×5): 40 mg via ORAL
  Filled 2021-06-21 (×5): qty 1

## 2021-06-21 NOTE — Significant Event (Signed)
Rapid Response Event Note   Reason for Call :  Change in LOC  Initial Focused Assessment:  Patient somnolent, responsive to pain. Primary nurse reports that patient did not receive sedatives last night. CBG @ 0800 140     Plan of Care:  Repeat CBG results 130 ABG results pending.   Event Summary:  Patient awake and eating breakfast at 1000.   MD Notified:  Dr Cathlean Sauer  Call Time: [phone] Arrival Time: 0930 End Time: [place] Sharunda Salmon, RN

## 2021-06-21 NOTE — Progress Notes (Addendum)
PROGRESS NOTE    VIOLANDA BOBECK  Katelyn [name]:[id number] DOB: 03-27-1946 DOA: 06/19/2021 PCP: Sande Brothers, MD    Brief Narrative:  Katelyn [name] was admitted to the hospital with working diagnosis of generalized weakness and acute metabolic encephalopathy.   75 year old female with past medical history for type 2 diabetes mellitus, hypertension, hyperlipidemia, chronic anemia and dementia who presented with generalized weakness.  She was noted to have worsening weakness for about 2-3 days at the skilled nursing facility decreased mobility and requiring increased assistance.  She had worsening confusion and episodic agitation that prompted her to be brought to the hospital. On her initial physical examination she was afebrile, heart rate 99-109, blood pressure 127/93, respiratory rate 16-27, oxygen saturation 96%, she was awake, she was unable to follow instructions but her strength was preserved upper and lower extremities.  Her lungs are clear to auscultation bilaterally, heart S1-S2, present, rhythmic, soft abdomen, no lower extremity edema.   Sodium 136, potassium 4.3, chloride 107, bicarb 23, glucose 117, BUN 16, creatinine 0.92, calcium 10.6, ammonia 11, white count 8.2, hemoglobin 9.4, hematocrit 39.6, platelets 263. SARS COVID-19 negative.   Urine analysis specific gravity 1.010, negative nitrates. Toxicology screen negative.   CT abdomen pelvis no acute findings. Head CT no acute changes.   Chest radiograph, no infiltrates.   EKG 107 bpm, left axis deviation, right bundle branch block, sinus rhythm, no significant ST segment or T wave changes.   Patient was placed on intravenous fluids for hypercalcemia, discontinue polypharmacy. Close neurochecks, consulted physical therapy and occupational therapy  10/22 patient very somnolent and rapid response was called.    Assessment & Plan:   Principal Problem:   Generalized weakness Active Problems:   DM2 (diabetes mellitus, type  2) (HCC)   HLD (hyperlipidemia)   Essential hypertension   Acute metabolic encephalopathy   Hypercalcemia   Constipation   Acute metabolic encephalopathy in the setting of dementia.   This morning patient very somnolent, she responds to voice and touch, but continue to be somnolent.  No sedatives last night.   I spoke with her sister yesterday and patient had very poor sleep at the SNF.   Plan to get ABG and continue close neuro checks. Aspiration precautions. Follow with PT and OT.  Patient with advance dementia and acute encephalopathy, likely multifactorial.  Consult nutrition, will add thiamine and multivitamins.   2. Hypercalcemia. Calcium continue to be elevated at 10,5 despide hydration with IV fluids. Pending PTH.  Suspected primary hyperparathyroidism, correction of calcium may improve patient's cognitive deficit.    3.  Hypertension.   Continue to hold on HCTZ, due to hypercalcemia. Continue close blood pressure monitoring.   Continue blood pressure control with amlodipine.    4.  Type 2 diabetes mellitus/ dyslipidemia.   Fasting glucose 171, continue sliding scale for glucose cover and monitoring. Patient is tolerating po well.    5. Right lower extremity edema,  Pending [place] lower extremity   Patient continue to be at high risk for worsening encephalopathy   Status is: Inpatient  Remains inpatient appropriate because: neuro checks    DVT prophylaxis: Enoxaparin   Code Status:    full  Family Communication:   No family at the bedside       Subjective: Patient with no nausea or vomiting, very somnolent this am but responds to touch and voice.  Most information from nursing at the bedside   Objective: Vitals:   06/20/21 1840 06/20/21 1959 06/21/21 0422 06/21/21 0500  BP: (!) 144/88 (!) 150/81 (!) 141/99   Pulse: (!) 103 (!) 102 98   Resp: 18 14 12    Temp: 98.6 F (37 C) 98.2 F (36.8 C) 98.6 F (37 C)   TempSrc: Oral Oral Oral   SpO2: 100% 98%  99%   Weight:    104.7 kg    Intake/Output Summary (Last 24 hours) at 06/21/2021 0959 Last data filed at 06/21/2021 5409 Gross per 24 hour  Intake 825 ml  Output 800 ml  Net 25 ml   Filed Weights   06/21/21 0500  Weight: 104.7 kg    Examination:   General: Not in pain or dyspnea  Neurology: very somnolent but responds to voice and touch, follows commands.   E ENT: no pallor, no icterus, oral mucosa moist Cardiovascular: No JVD. S1-S2 present, rhythmic, no gallops, rubs, or murmurs. No lower extremity edema. Pulmonary: positive breath sounds bilaterally, adequate air movement, no wheezing, rhonchi or rales. Gastrointestinal. Abdomen soft and non tender Skin. No rashes Musculoskeletal: no joint deformities     Data Reviewed: I have personally reviewed following labs and imaging studies  CBC: Recent Labs  Lab 06/19/21 2220 06/20/21 0515  WBC 8.2 7.5  NEUTROABS 5.2  --   HGB 9.4* 9.5*  HCT 29.6* 30.3*  MCV 77.9* 78.7*  PLT 263 811   Basic Metabolic Panel: Recent Labs  Lab 06/19/21 2220 06/20/21 0515 06/21/21 0653  NA 136 137 135  K 4.3 4.2 3.5  CL 107 104 108  CO2 23 25 21*  GLUCOSE 117* 123* 171*  BUN 16 13 11   CREATININE 0.92 0.88 0.77  CALCIUM 10.6* 10.6* 10.5*  MG  --  1.3*  --   PHOS  --  3.0  --    GFR: CrCl cannot be calculated (Unknown ideal weight.). Liver Function Tests: Recent Labs  Lab 06/19/21 2220 06/20/21 0515  AST 16 16  ALT 13 14  ALKPHOS 62 66  BILITOT 0.5 0.4  PROT 7.4 7.3  ALBUMIN 3.5 3.5   Recent Labs  Lab 06/19/21 2220  LIPASE 32   Recent Labs  Lab 06/20/21 0515  AMMONIA 11   Coagulation Profile: Recent Labs  Lab 06/20/21 0515  INR 1.1   Cardiac Enzymes: Recent Labs  Lab 06/20/21 0515  CKTOTAL 219   BNP (last 3 results) No results for input(s): PROBNP in the last 8760 hours. HbA1C: No results for input(s): HGBA1C in the last 72 hours. CBG: Recent Labs  Lab 06/20/21 1143 06/20/21 2002  06/20/21 2333 06/21/21 0805 06/21/21 0933  GLUCAP 118* 131* 136* 140* 130*   Lipid Profile: No results for input(s): CHOL, HDL, LDLCALC, TRIG, CHOLHDL, LDLDIRECT in the last 72 hours. Thyroid Function Tests: Recent Labs    06/20/21 0515  TSH 1.393   Anemia Panel: No results for input(s): VITAMINB12, FOLATE, FERRITIN, TIBC, IRON, RETICCTPCT in the last 72 hours.    Radiology Studies: I have reviewed all of the imaging during this hospital visit personally     Scheduled Meds:  amLODipine  10 mg Oral Daily   docusate sodium  100 mg Oral BID   insulin aspart  0-6 Units Subcutaneous TID WC   Continuous Infusions:   LOS: 1 day        Raejean Swinford Gerome Apley, MD

## 2021-06-22 DIAGNOSIS — G9341 Metabolic encephalopathy: Secondary | ICD-10-CM | POA: Diagnosis not present

## 2021-06-22 DIAGNOSIS — R531 Weakness: Secondary | ICD-10-CM | POA: Diagnosis not present

## 2021-06-22 DIAGNOSIS — K5900 Constipation, unspecified: Secondary | ICD-10-CM | POA: Diagnosis not present

## 2021-06-22 DIAGNOSIS — E119 Type 2 diabetes mellitus without complications: Secondary | ICD-10-CM | POA: Diagnosis not present

## 2021-06-22 LAB — BASIC METABOLIC PANEL
Anion gap: 6 (ref 5–15)
BUN: 13 mg/dL (ref 8–23)
CO2: 22 mmol/L (ref 22–32)
Calcium: 10.2 mg/dL (ref 8.9–10.3)
Chloride: 107 mmol/L (ref 98–111)
Creatinine, Ser: 0.92 mg/dL (ref 0.44–1.00)
GFR, Estimated: >60 mL/min (ref >60)
Glucose, Bld: 183 mg/dL — ABNORMAL HIGH (ref 70–99)
Potassium: 3.4 mmol/L — ABNORMAL LOW (ref 3.5–5.1)
Sodium: 135 mmol/L (ref 135–145)

## 2021-06-22 LAB — PARATHYROID HORMONE, INTACT (NO CA): PTH: 92 pg/mL — ABNORMAL HIGH (ref 15–65)

## 2021-06-22 LAB — GLUCOSE, CAPILLARY
Glucose-Capillary: 165 mg/dL — ABNORMAL HIGH (ref 70–99)
Glucose-Capillary: 252 mg/dL — ABNORMAL HIGH (ref 70–99)
Glucose-Capillary: 193 mg/dL — ABNORMAL HIGH (ref 70–99)

## 2021-06-22 LAB — CALCIUM, IONIZED: Calcium, Ionized, Serum: 6.3 mg/dL — ABNORMAL HIGH (ref 4.5–5.6)

## 2021-06-22 MED ORDER — ENSURE MAX PROTEIN PO LIQD
11.0000 [oz_av] | Freq: Two times a day (BID) | ORAL | Status: DC
  Administered 2021-06-22 – 2021-06-25 (×6): 11 [oz_av] via ORAL
  Filled 2021-06-22 (×7): qty 330

## 2021-06-22 MED ORDER — POTASSIUM CHLORIDE CRYS ER 20 MEQ PO TBCR
40.0000 meq | EXTENDED_RELEASE_TABLET | ORAL | Status: AC
  Administered 2021-06-22 (×2): 40 meq via ORAL
  Filled 2021-06-22 (×2): qty 2

## 2021-06-22 NOTE — Evaluation (Signed)
Physical Therapy Evaluation Patient Details Name: Katelyn [name] MRN: [id number] DOB: 1946/06/17 Today's Date: 06/22/2021  History of Present Illness  75 year old female from ALF admitted 06/19/21 with working diagnosis of generalized weakness and acute metabolic encephalopathy. PMH includes: type 2 diabetes mellitus, hypertension, hyperlipidemia, chronic anemia and dementia  Clinical Impression  On eval, pt required Min A for mobility. She walked ~15 feetx2 with use of RW. Pt presents with general weakness, decreased activity tolerance, and impaired gait and balance. Will plan to follow and progress activity as tolerated. PT recommendation is for ST SNF for continued rehab. Family reported they are hoping to get pt into a different ALF at some point.        Recommendations for follow up therapy are one component of a multi-disciplinary discharge planning process, led by the attending physician.  Recommendations may be updated based on patient status, additional functional criteria and insurance authorization.  Follow Up Recommendations SNF    Equipment Recommendations  None recommended by PT    Recommendations for Other Services       Precautions / Restrictions Precautions Precautions: Fall Restrictions Weight Bearing Restrictions: No      Mobility  Bed Mobility               General bed mobility comments: OOB in recliner at beginning and end of session    Transfers Overall transfer level: Needs assistance Equipment used: Rolling walker (2 wheeled) Transfers: Sit to/from Stand Sit to Stand: Min assist         General transfer comment: Cues for safety, technique, hand placement. Increased time. Assist to power up, steady, control descent.  Ambulation/Gait Ambulation/Gait assistance: Min assist Gait Distance (Feet): 15 Feet (x2) Assistive device: Rolling walker (2 wheeled) Gait Pattern/deviations: Step-through pattern;Decreased stride length;Trunk flexed      General Gait Details: Cues for safety. Assist to steady pt and manage RW safely. Pt fatigues fairly easily.  Stairs            Wheelchair Mobility    Modified Rankin (Stroke Patients Only)       Balance Overall balance assessment: Needs assistance         Standing balance support: Bilateral upper extremity supported Standing balance-Leahy Scale: Poor                               Pertinent Vitals/Pain Pain Assessment: No/denies pain    Home Living Family/patient expects to be discharged to:: Assisted living                 Additional Comments: family is working on a different placement than where she was living    Prior Function Level of Independence: Needs assistance   Gait / Transfers Assistance Needed: uses Rollator at baseline  ADL's / Homemaking Assistance Needed: assist from staff for bathing and dressing        Hand Dominance   Dominant Hand: Right    Extremity/Trunk Assessment   Upper Extremity Assessment Upper Extremity Assessment: Defer to OT evaluation    Lower Extremity Assessment Lower Extremity Assessment: Generalized weakness    Cervical / Trunk Assessment Cervical / Trunk Assessment: Normal  Communication   Communication: No difficulties  Cognition Arousal/Alertness: Awake/alert Behavior During Therapy: WFL for tasks assessed/performed Overall Cognitive Status: History of cognitive impairments - at baseline  General Comments: easily distracted, but easily redirected - very pleasant and funny!      General Comments General comments (skin integrity, edema, etc.): Sister present throughout session and able to confirm PLOF    Exercises     Assessment/Plan    PT Assessment Patient needs continued PT services  PT Problem List Decreased strength;Decreased mobility;Decreased activity tolerance;Decreased balance;Decreased knowledge of use of DME;Decreased  cognition;Decreased safety awareness       PT Treatment Interventions DME instruction;Gait training;Therapeutic activities;Therapeutic exercise;Patient/family education;Balance training;Functional mobility training    PT Goals (Current goals can be found in the Care Plan section)  Acute Rehab PT Goals Patient Stated Goal: work with therapy PT Goal Formulation: With patient/family Time For Goal Achievement: 07/06/21 Potential to Achieve Goals: Good    Frequency Min 2X/week   Barriers to discharge        Co-evaluation   Reason for Co-Treatment: Necessary to address cognition/behavior during functional activity;For patient/therapist safety;To address functional/ADL transfers;Other (comment) (initial evaluation due to Pt from memory SNF, BMI, generalized weakness) PT goals addressed during session: Mobility/safety with mobility;Balance;Proper use of DME OT goals addressed during session: ADL's and self-care;Proper use of Adaptive equipment and DME       AM-PAC PT "6 Clicks" Mobility  Outcome Measure Help needed turning from your back to your side while in a flat bed without using bedrails?: A Little Help needed moving from lying on your back to sitting on the side of a flat bed without using bedrails?: A Little Help needed moving to and from a bed to a chair (including a wheelchair)?: A Little Help needed standing up from a chair using your arms (e.g., wheelchair or bedside chair)?: A Little Help needed to walk in hospital room?: A Little Help needed climbing 3-5 steps with a railing? : A Lot 6 Click Score: 17    End of Session Equipment Utilized During Treatment: Gait belt Activity Tolerance: Patient limited by fatigue Patient left: in chair;with call bell/phone within reach;with chair alarm set;with family/visitor present   PT Visit Diagnosis: Difficulty in walking, not elsewhere classified (R26.2);Muscle weakness (generalized) (M62.81)    Time: 1638-4536 PT Time  Calculation (min) (ACUTE ONLY): 21 min   Charges:   PT Evaluation $PT Eval Moderate Complexity: 1 Mod            Doreatha Massed, PT Acute Rehabilitation  Office: [phone] Pager: [phone]

## 2021-06-22 NOTE — Progress Notes (Signed)
Initial Nutrition Assessment  DOCUMENTATION CODES:   Obesity unspecified  INTERVENTION:   Ensure Max protein supplement BID, each supplement provides 150kcal and 30g of protein.  MVI po daily   NUTRITION DIAGNOSIS:   Inadequate oral intake related to acute illness as evidenced by per patient/family report.  GOAL:   Patient will meet greater than or equal to 90% of their needs  MONITOR:   PO intake, Supplement acceptance, Labs, Weight trends, Skin, I & O's  REASON FOR ASSESSMENT:   Consult Assessment of nutrition requirement/status  ASSESSMENT:   75 y.o. female with medical history significant for dementia, type 2 diabetes mellitus, hypertension, hyperlipidemia, GERD, ventral hernia, H. pylori, chronic pain syndrome, chronic anemia with baseline hemoglobin 9-10 who is admitted with acute encephalopathy and generalized weakness.  RD working remotely.  Unable to speak with pt via phone r/t AMS. Per chart review, pt eating 50-100% of meals in hospital. RD will add supplements and MVI to help pt meet her estimated needs. There is no documented weight history in chart to determine if any significant weight changes. RD will follow up to obtain nfpe.   Medications reviewed and include: colace, insulin, protonix, thiamine, NaCl @75ml /hr  Labs reviewed: K 3.4(L) Hgb 9.5(L), Hct 30.3(L), MCV 78.7(L), MCH 24.7(L) Cbgs- 165, 252 x 24 hrs AIC 6.9(H) 10/20   NUTRITION - FOCUSED PHYSICAL EXAM: Unable to perform at this time   Diet Order:   Diet Order             Diet regular Room service appropriate? Yes; Fluid consistency: Thin  Diet effective now                  EDUCATION NEEDS:   No education needs have been identified at this time  Skin:  Skin Assessment: Reviewed RN Assessment  Last BM:  10/23  Height:   Ht Readings from Last 1 Encounters:  06/21/21 5\' 5"  (1.651 m)    Weight:   Wt Readings from Last 1 Encounters:  06/22/21 108.8 kg    Ideal Body  Weight:  56.8 kg  BMI:  Body mass index is 39.92 kg/m.  Estimated Nutritional Needs:   Kcal:  2000-2300kcal/day  Protein:  100-115g/day  Fluid:  1.7-2.0L/day  Koleen Distance MS, RD, LDN Please refer to [facility] for RD and/or RD on-call/weekend/after hours pager

## 2021-06-22 NOTE — Progress Notes (Signed)
PROGRESS NOTE    Katelyn [name]  PYK:[id number] DOB: 1946-05-14 DOA: 06/19/2021 PCP: Sande Brothers, MD    Brief Narrative:  Katelyn [name] was admitted to the hospital with working diagnosis of generalized weakness and acute metabolic encephalopathy.   75 year old female with past medical history for type 2 diabetes mellitus, hypertension, hyperlipidemia, chronic anemia and dementia who presented with generalized weakness.  She was noted to have worsening weakness for about 2-3 days at the skilled nursing facility decreased mobility and requiring increased assistance.  She had worsening confusion and episodic agitation that prompted her to be brought to the hospital. On her initial physical examination she was afebrile, heart rate 99-109, blood pressure 127/93, respiratory rate 16-27, oxygen saturation 96%, she was awake, she was unable to follow instructions but her strength was preserved upper and lower extremities.  Her lungs are clear to auscultation bilaterally, heart S1-S2, present, rhythmic, soft abdomen, no lower extremity edema.   Sodium 136, potassium 4.3, chloride 107, bicarb 23, glucose 117, BUN 16, creatinine 0.92, calcium 10.6, ammonia 11, white count 8.2, hemoglobin 9.4, hematocrit 39.6, platelets 263. SARS COVID-19 negative.   Urine analysis specific gravity 1.010, negative nitrates. Toxicology screen negative.   CT abdomen pelvis no acute findings. Head CT no acute changes.   Chest radiograph, no infiltrates.   EKG 107 bpm, left axis deviation, right bundle branch block, sinus rhythm, no significant ST segment or T wave changes.   Patient was placed on intravenous fluids for hypercalcemia, discontinue polypharmacy. Close neurochecks, consulted physical therapy and occupational therapy   10/22 patient very somnolent and rapid response was called. Improved with supportive medical care, no respiratory acidosis.    Continue to be very weak and deconditioned, plan to  transfer to SNF.   Assessment & Plan:   Principal Problem:   Generalized weakness Active Problems:   DM2 (diabetes mellitus, type 2) (HCC)   HLD (hyperlipidemia)   Essential hypertension   Acute metabolic encephalopathy   Hypercalcemia   Constipation   Acute metabolic encephalopathy in the setting of dementia.   Her mentation improved through the day yesterday, this am she is sleeping but is easy to arouse.  Not in pain or dyspnea.   Continue nutritional support, thiamine and multivitamins. PT and OT Follow on intact PTH.  Ok to discontinue telemetry monitoring.  Patient with advance dementia and acute encephalopathy, likely multifactorial.    2. Hypercalcemia/ hypokalemia. Suspected primary hyperparathyroidism, correction of calcium may improve patient's cognitive deficit.  Renal function with serum cr at 0,93, K is 3,4 and serum bicarbonate at 22 with Na 135.   Despite hydration Ca continue to be elevated today is  10.2, will continue with IV fluids for 24 hrs more and follow up on serum ca in am. Intact PTH still pending.  Add 2 doses of Kcl 40 meq and follow electrolytes in am.   3.  Hypertension.   Continue to hold on HCTZ, due to hypercalcemia. Blood pressure control with amlodipine.    4.  Type 2 diabetes mellitus/ dyslipidemia.   Fasting glucose this am 183, continue close monitoring and coverage with sliding scale.    5. Right lower extremity edema,  Lower extremity US was negative for DVT    Status is: Inpatient  Remains inpatient appropriate because: pending transfer to SNF, patient is medically stable    DVT prophylaxis:  Enoxaparin   Code Status:    full  Family Communication:   No family at the bedside  Subjective: Patient with no nausea or vomiting, no chest pain or dyspnea, this am sleeping but yesterday as the day pass her mentation improved.   Objective: Vitals:   06/21/21 2100 06/21/21 2132 06/22/21 0515 06/22/21 0518  BP: 109/63 (!)  156/77 (!) 153/78   Pulse: 100 97 91   Resp:  17 18   Temp:  98.3 F (36.8 C) 99.1 F (37.3 C)   TempSrc:  Oral Oral   SpO2: 96% 100% 100%   Weight:    108.8 kg  Height:        Intake/Output Summary (Last 24 hours) at 06/22/2021 0909 Last data filed at 06/22/2021 0517 Gross per 24 hour  Intake 1467.09 ml  Output 200 ml  Net 1267.09 ml   Filed Weights   06/21/21 0500 06/22/21 0518  Weight: 104.7 kg 108.8 kg    Examination:   General: Not in pain or dyspnea.  Neurology: this am sleeping but easy to arouse.  E ENT: no pallor, no icterus, oral mucosa moist Cardiovascular: No JVD. S1-S2 present, rhythmic, no gallops, rubs, or murmurs. Right lower extremity edema. Pulmonary: vesicular breath sounds bilaterally, adequate air movement, no wheezing, rhonchi or rales. Gastrointestinal. Abdomen soft and non tender Skin. No rashes Musculoskeletal: no joint deformities     Data Reviewed: I have personally reviewed following labs and imaging studies  CBC: Recent Labs  Lab 06/19/21 2220 06/20/21 0515  WBC 8.2 7.5  NEUTROABS 5.2  --   HGB 9.4* 9.5*  HCT 29.6* 30.3*  MCV 77.9* 78.7*  PLT 263 509   Basic Metabolic Panel: Recent Labs  Lab 06/19/21 2220 06/20/21 0515 06/21/21 0653 06/22/21 0556  NA 136 137 135 135  K 4.3 4.2 3.5 3.4*  CL 107 104 108 107  CO2 23 25 21* 22  GLUCOSE 117* 123* 171* 183*  BUN 16 13 11 13   CREATININE 0.92 0.88 0.77 0.92  CALCIUM 10.6* 10.6* 10.5* 10.2  MG  --  1.3*  --   --   PHOS  --  3.0  --   --    GFR: Estimated Creatinine Clearance: 65.8 mL/min (by C-G formula based on SCr of 0.92 mg/dL). Liver Function Tests: Recent Labs  Lab 06/19/21 2220 06/20/21 0515  AST 16 16  ALT 13 14  ALKPHOS 62 66  BILITOT 0.5 0.4  PROT 7.4 7.3  ALBUMIN 3.5 3.5   Recent Labs  Lab 06/19/21 2220  LIPASE 32   Recent Labs  Lab 06/20/21 0515  AMMONIA 11   Coagulation Profile: Recent Labs  Lab 06/20/21 0515  INR 1.1   Cardiac  Enzymes: Recent Labs  Lab 06/20/21 0515  CKTOTAL 219   BNP (last 3 results) No results for input(s): PROBNP in the last 8760 hours. HbA1C: No results for input(s): HGBA1C in the last 72 hours. CBG: Recent Labs  Lab 06/21/21 0933 06/21/21 1254 06/21/21 1706 06/21/21 2123 06/22/21 0748  GLUCAP 130* 289* 199* 206* 165*   Lipid Profile: No results for input(s): CHOL, HDL, LDLCALC, TRIG, CHOLHDL, LDLDIRECT in the last 72 hours. Thyroid Function Tests: Recent Labs    06/20/21 0515  TSH 1.393   Anemia Panel: Recent Labs    06/21/21 0653  FOLATE 16.8      Radiology Studies: I have reviewed all of the imaging during this hospital visit personally     Scheduled Meds:  amLODipine  10 mg Oral Daily   docusate sodium  100 mg Oral BID   insulin aspart  0-6  Units Subcutaneous TID WC   multivitamins with iron  1 tablet Oral Daily   pantoprazole  40 mg Oral Daily   thiamine  250 mg Oral BID   Continuous Infusions:  sodium chloride 75 mL/hr at 06/22/21 0300     LOS: 2 days        Taeko Schaffer Gerome Apley, MD

## 2021-06-22 NOTE — Evaluation (Signed)
Occupational Therapy Evaluation Patient Details Name: Katelyn [name] MRN: [id number] DOB: [birth year] Today's Date: 06/22/2021   History of Present Illness Katelyn [name] is a 75 year old female from SNF admitted 06/19/21 with working diagnosis of generalized weakness and acute metabolic encephalopathy. PMH includes: type 2 diabetes mellitus, hypertension, hyperlipidemia, chronic anemia and dementia   Clinical Impression   Pt typically gets assist for bathing/dressing at facility, she typically ambulates with a Rollator and per sister does experience sundowning. Today she is pleasant, cooperative, tangential and distractable - but easily redirected and Pt has a great sense of humor. She is mod to max A for LB ADL, and min A to set up for UB ADL in seated position. She was able to perform toilet transfer at min guard assist with vc for safe hand placement with RW. Ambulated into bathroom. VSS with SpO2 at 99% and HR at 111. OT will follow acutely and Pt will benefit from skilled OT post-acute at her care center (sister present said they are not happy there and she will be going somewhere else). Next session to focus on standing grooming tasks and activity tolerance.       Recommendations for follow up therapy are one component of a multi-disciplinary discharge planning process, led by the attending physician.  Recommendations may be updated based on patient status, additional functional criteria and insurance authorization.   Follow Up Recommendations  Other (comment);SNF;Home health OT (return to care facility, get OT services there (HHOT at assisted living or through memory care unit at [facility]))    Equipment Recommendations  Other (comment) (defer to next venue of care)    Recommendations for Other Services PT consult     Precautions / Restrictions Precautions Precautions: Fall Precaution Comments: sundowns per sister Restrictions Weight Bearing Restrictions: No      Mobility Bed  Mobility               General bed mobility comments: OOB in recliner at beginning and end of session    Transfers Overall transfer level: Needs assistance Equipment used: Rolling walker (2 wheeled) Transfers: Sit to/from Stand Sit to Stand: Min guard;Min assist         General transfer comment: vc for safe hand placement with RW, steadying assist only    Balance Overall balance assessment: Mild deficits observed, not formally tested                                         ADL either performed or assessed with clinical judgement   ADL Overall ADL's : Needs assistance/impaired Eating/Feeding: Set up;Sitting   Grooming: Set up;Wash/dry hands;Wash/dry face;Sitting   Upper Body Bathing: Moderate assistance   Lower Body Bathing: Moderate assistance   Upper Body Dressing : Minimal assistance   Lower Body Dressing: Moderate assistance   Toilet Transfer: Min guard;Minimal assistance;Ambulation;RW Toilet Transfer Details (indicate cue type and reason): vc for safe hand placement with RW Toileting- Clothing Manipulation and Hygiene: Maximal assistance;Sit to/from stand       Functional mobility during ADLs: Min guard;Minimal assistance;Rolling walker       Vision Patient Visual Report: No change from baseline       Perception     Praxis      Pertinent Vitals/Pain Pain Assessment: No/denies pain     Hand Dominance Right   Extremity/Trunk Assessment Upper Extremity Assessment Upper Extremity Assessment: Overall WFL for tasks assessed  Lower Extremity Assessment Lower Extremity Assessment: Defer to PT evaluation       Communication Communication Communication: No difficulties   Cognition Arousal/Alertness: Awake/alert Behavior During Therapy: WFL for tasks assessed/performed Overall Cognitive Status: History of cognitive impairments - at baseline                                 General Comments: easily distracted,  but easily redirected - very pleasant and funny!   General Comments  Sister present throughout session and able to confirm PLOF    Exercises     Shoulder Instructions      Home Living Family/patient expects to be discharged to:: Assisted living                                 Additional Comments: family is working on a different placement than where she was living      Prior Functioning/Environment Level of Independence: Needs assistance  Gait / Transfers Assistance Needed: uses Rollator at baseline ADL's / Homemaking Assistance Needed: assist from staff for bathing and dressing            OT Problem List: Decreased strength;Decreased range of motion;Decreased activity tolerance;Impaired balance (sitting and/or standing);Decreased cognition;Obesity      OT Treatment/Interventions: Self-care/ADL training;DME and/or AE instruction;Therapeutic activities;Patient/family education;Balance training    OT Goals(Current goals can be found in the care plan section) Acute Rehab OT Goals Patient Stated Goal: work with therapy OT Goal Formulation: With patient/family Time For Goal Achievement: 07/07/21 Potential to Achieve Goals: Good ADL Goals Pt Will Perform Grooming: with set-up;standing Pt Will Perform Upper Body Dressing: with min guard assist;sitting Pt Will Transfer to Toilet: with supervision;ambulating Pt Will Perform Toileting - Clothing Manipulation and hygiene: with supervision;sit to/from stand;sitting/lateral leans Additional ADL Goal #1: Pt will perform bed mobility at supervision level prior to engaging in ADL  OT Frequency: Min 2X/week   Barriers to D/C:            Co-evaluation PT/OT/SLP Co-Evaluation/Treatment: Yes Reason for Co-Treatment: Necessary to address cognition/behavior during functional activity;For patient/therapist safety;To address functional/ADL transfers;Other (comment) (initial evaluation due to Pt from memory SNF, BMI,  generalized weakness) PT goals addressed during session: Mobility/safety with mobility;Balance;Proper use of DME OT goals addressed during session: ADL's and self-care;Proper use of Adaptive equipment and DME      AM-PAC OT "6 Clicks" Daily Activity     Outcome Measure Help from another person eating meals?: None Help from another person taking care of personal grooming?: A Little Help from another person toileting, which includes using toliet, bedpan, or urinal?: A Lot Help from another person bathing (including washing, rinsing, drying)?: A Lot Help from another person to put on and taking off regular upper body clothing?: A Little Help from another person to put on and taking off regular lower body clothing?: A Lot 6 Click Score: 16   End of Session Equipment Utilized During Treatment: Gait belt;Rolling walker Nurse Communication: Mobility status;Precautions  Activity Tolerance: Patient tolerated treatment well Patient left: in chair;with call bell/phone within reach;with chair alarm set;with family/visitor present  OT Visit Diagnosis: Muscle weakness (generalized) (M62.81);Other symptoms and signs involving cognitive function                Time: 8850-2774 OT Time Calculation (min): 24 min Charges:  OT General Charges $OT Visit: 1 Visit OT  Evaluation $OT Eval Moderate Complexity: [place] OTR/L Acute Rehabilitation Services Pager: [phone] Office: [place] 06/22/2021, 3:36 PM

## 2021-06-23 DIAGNOSIS — K5900 Constipation, unspecified: Secondary | ICD-10-CM | POA: Diagnosis not present

## 2021-06-23 DIAGNOSIS — I10 Essential (primary) hypertension: Secondary | ICD-10-CM | POA: Diagnosis not present

## 2021-06-23 DIAGNOSIS — G9341 Metabolic encephalopathy: Secondary | ICD-10-CM | POA: Diagnosis not present

## 2021-06-23 DIAGNOSIS — R531 Weakness: Secondary | ICD-10-CM | POA: Diagnosis not present

## 2021-06-23 LAB — GLUCOSE, CAPILLARY
Glucose-Capillary: 266 mg/dL — ABNORMAL HIGH (ref 70–99)
Glucose-Capillary: 160 mg/dL — ABNORMAL HIGH (ref 70–99)
Glucose-Capillary: 224 mg/dL — ABNORMAL HIGH (ref 70–99)
Glucose-Capillary: 209 mg/dL — ABNORMAL HIGH (ref 70–99)
Glucose-Capillary: 241 mg/dL — ABNORMAL HIGH (ref 70–99)

## 2021-06-23 LAB — BASIC METABOLIC PANEL
Anion gap: 4 — ABNORMAL LOW (ref 5–15)
BUN: 15 mg/dL (ref 8–23)
CO2: 21 mmol/L — ABNORMAL LOW (ref 22–32)
Calcium: 9.9 mg/dL (ref 8.9–10.3)
Chloride: 112 mmol/L — ABNORMAL HIGH (ref 98–111)
Creatinine, Ser: 0.91 mg/dL (ref 0.44–1.00)
GFR, Estimated: >60 mL/min (ref >60)
Glucose, Bld: 127 mg/dL — ABNORMAL HIGH (ref 70–99)
Potassium: 3.8 mmol/L (ref 3.5–5.1)
Sodium: 137 mmol/L (ref 135–145)

## 2021-06-23 MED ORDER — POTASSIUM CHLORIDE 20 MEQ PO PACK
40.0000 meq | PACK | Freq: Once | ORAL | Status: AC
  Administered 2021-06-23: 40 meq via ORAL
  Filled 2021-06-23: qty 2

## 2021-06-23 NOTE — Care Management Important Message (Signed)
Important Message  Patient Details IM Letter placed in Patients room. Name: Katelyn [name] MRN: [id number] Date of Birth: 04/17/1946   Medicare Important Message Given:  Yes     Kerin Salen 06/23/2021, 11:38 AM

## 2021-06-23 NOTE — NC FL2 (Signed)
[place] LEVEL OF CARE SCREENING TOOL     IDENTIFICATION  Patient Name: Katelyn [name] Birthdate: 08/03/1946 Sex: female Admission Date (Current Location): 06/19/2021  [facility] and [place] Number:  Herbalist and Address:  [facility],  [address], [place]      Provider Number:    Attending Physician Name and Address:  Tawni Millers,*  Relative Name and Phone Number:  [name] (Sister)   [phone]    Current Level of Care: Hospital Recommended Level of Care: [place] Prior Approval Number:    Date Approved/Denied: 06/23/21 PASRR Number: [id number]  Discharge Plan: SNF    Current Diagnoses: Patient Active Problem List   Diagnosis Date Noted   Generalized weakness 06/20/2021   Constipation 06/20/2021   Weakness    Ventral hernia 06/29/2018   Dementia without behavioral disturbance ([place]) 06/29/2018   Dehydration 07/16/2017   Hypercalcemia 07/16/2017   Diabetic gastroparesis associated with type 2 diabetes mellitus ([place]) 07/16/2017   Asthma 07/15/2017   Diabetes mellitus without complication ([place]) 26/33/3545   GERD (gastroesophageal reflux disease) 07/15/2017   Hyperparathyroidism ([place]) 62/56/3893   Acute metabolic encephalopathy 73/42/8768   Abdominal pain 07/15/2017   Diabetic neuropathy ([place]) 05/07/2015   Achilles tendinitis of left lower extremity 03/30/2014   Spinal stenosis, lumbar region, with neurogenic claudication 02/16/2014   Other chronic postoperative pain 02/16/2014   Bilateral knee contractures 02/16/2014   Obstructive sleep apnea 06/26/2013   TRIGGER FINGER 04/29/2010   Allergic rhinitis, cause unspecified 03/24/2010   OSTEOARTHRITIS 02/10/2010   DENTAL CARIES 09/24/2009   DIABETIC  RETINOPATHY 04/29/2009   ONYCHOMYCOSIS, TOENAILS 04/08/2009   HLD (hyperlipidemia) 04/08/2009   EDEMA 04/08/2009   Essential hypertension 12/24/2008   ELECTROCARDIOGRAM,  ABNORMAL 12/17/2008   SCIATICA, RIGHT 07/11/2007   DM2 (diabetes mellitus, type 2) ([place]) 04/11/2007   ANEMIA DUE TO DIETARY IRON DEFICIENCY 04/11/2007   OSTEOARTHRITIS, GENERALIZED 04/11/2007    Orientation RESPIRATION BLADDER Height & Weight     Self, Time, Situation, Place  Normal Incontinent Weight: 242 lb 4.8 oz (109.9 kg) Height:  5\' 5"  (165.1 cm)  BEHAVIORAL SYMPTOMS/MOOD NEUROLOGICAL BOWEL NUTRITION STATUS      Continent Diet  AMBULATORY STATUS COMMUNICATION OF NEEDS Skin   Limited Assist Verbally Normal                       Personal Care Assistance Level of Assistance  Bathing, Feeding, Dressing Bathing Assistance: Limited assistance Feeding assistance: Independent Dressing Assistance: Limited assistance Total Care Assistance: Limited assistance   Functional Limitations Info  Sight, Hearing, Speech Sight Info: Adequate Hearing Info: Adequate Speech Info: Adequate    SPECIAL CARE FACTORS FREQUENCY  PT (By licensed PT)     PT Frequency: 5x              Contractures Contractures Info: Not present    Additional Factors Info  Code Status, Allergies Code Status Info: Full Allergies Info: Aspirin, Hydrochlorothiazide, Ibuprofen, Latex, Tramadol           Current Medications (06/23/2021):  This is the current hospital active medication list Current Facility-Administered Medications  Medication Dose Route Frequency Provider Last Rate Last Admin   acetaminophen (TYLENOL) tablet 650 mg  650 mg Oral Q6H PRN Howerter, Justin B, DO   650 mg at 06/22/21 2129   Or   acetaminophen (TYLENOL) suppository 650 mg  650 mg Rectal Q6H PRN Howerter, Justin B, DO  amLODipine (NORVASC) tablet 10 mg  10 mg Oral Daily Howerter, Justin B, DO   10 mg at 06/22/21 0942   docusate sodium (COLACE) capsule 100 mg  100 mg Oral BID Howerter, Justin B, DO   100 mg at 06/22/21 2128   insulin aspart (novoLOG) injection 0-6 Units  0-6 Units Subcutaneous TID WC Howerter, Justin  B, DO   1 Units at 06/23/21 0757   multivitamins with iron tablet 1 tablet  1 tablet Oral Daily Arrien, Jimmy Picket, MD   1 tablet at 06/22/21 0942   pantoprazole (PROTONIX) EC tablet 40 mg  40 mg Oral Daily Arrien, Jimmy Picket, MD   40 mg at 06/22/21 5749   polyethylene glycol (MIRALAX / GLYCOLAX) packet 17 g  17 g Oral Daily PRN Howerter, Justin B, DO       protein supplement (ENSURE MAX) liquid  11 oz Oral BID Tawni Millers, MD   11 oz at 06/22/21 2138   thiamine tablet 250 mg  250 mg Oral BID Tawni Millers, MD   250 mg at 06/22/21 2128     Discharge Medications: Please see discharge summary for a list of discharge medications.  Relevant Imaging Results:  Relevant Lab Results:   Additional Information PT SSN: [id number]  Natasha Bence, LCSW

## 2021-06-23 NOTE — Progress Notes (Signed)
PROGRESS NOTE    Katelyn [name]  TFT:[id number] DOB: 1946-04-23 DOA: 06/19/2021 PCP: Sande Brothers, MD    Brief Narrative:  Katelyn [name] was admitted to the hospital with working diagnosis of generalized weakness and acute metabolic encephalopathy.   75 year old female with past medical history for type 2 diabetes mellitus, hypertension, hyperlipidemia, chronic anemia and dementia who presented with generalized weakness.  She was noted to have worsening weakness for about 2-3 days at the skilled nursing facility decreased mobility and requiring increased assistance.  She had worsening confusion and episodic agitation that prompted her to be brought to the hospital. On her initial physical examination she was afebrile, heart rate 99-109, blood pressure 127/93, respiratory rate 16-27, oxygen saturation 96%, she was awake, she was unable to follow instructions but her strength was preserved upper and lower extremities.  Her lungs are clear to auscultation bilaterally, heart S1-S2, present, rhythmic, soft abdomen, no lower extremity edema.   Sodium 136, potassium 4.3, chloride 107, bicarb 23, glucose 117, BUN 16, creatinine 0.92, calcium 10.6, ammonia 11, white count 8.2, hemoglobin 9.4, hematocrit 39.6, platelets 263. SARS COVID-19 negative.   Urine analysis specific gravity 1.010, negative nitrates. Toxicology screen negative.   CT abdomen pelvis no acute findings. Head CT no acute changes.   Chest radiograph, no infiltrates.   EKG 107 bpm, left axis deviation, right bundle branch block, sinus rhythm, no significant ST segment or T wave changes.   Patient was placed on intravenous fluids for hypercalcemia, discontinue polypharmacy. Close neurochecks, consulted physical therapy and occupational therapy   10/24 patient is more active and alert.  Patient is still somewhat disoriented. She is oriented to person and place, but not date.  Patient does endorse improvement and strength.    Patient presentation improving, plan to transfer to SNF.   Assessment & Plan:   Principal Problem:   Generalized weakness Active Problems:   DM2 (diabetes mellitus, type 2) (HCC)   HLD (hyperlipidemia)   Essential hypertension   Acute metabolic encephalopathy   Hypercalcemia   Constipation   Acute metabolic encephalopathy in the setting of dementia.   Her mentation improved through the day yesterday, this am she is sleeping but is easy to arouse.  Not in pain or dyspnea.   Continue nutritional support, thiamine and multivitamins. PT and OT Follow on intact PTH.  Ok to discontinue telemetry monitoring.  Patient with advance dementia and acute encephalopathy, likely multifactorial.    2.  Primary hyperparathyroidism/hypercalcemia PTH of 9.2; given this finding, patient's hypercalcemia is likely primary hyperparathyroidism. General surgery was consulted.  They state that this condition is usually managed in the outpatient setting.  Patient will need to follow up with general surgery for management of this condition.   3. Hypokalemia.  10/24 -potassium of 3.8.  Patient's potassium levels are now on the lower end of normal. Additional 2 doses of Kcl 40 meq and follow electrolytes in am.   4.  Hypertension.   Continue to hold on HCTZ, due to hypercalcemia. Blood pressure control with amlodipine.    5.  Type 2 diabetes mellitus/ dyslipidemia.   Fasting glucose this am 183, continue close monitoring and coverage with sliding scale.    6. Right lower extremity edema,  Lower extremity US was negative for DVT    Status is: Inpatient  Remains inpatient appropriate because: pending transfer to SNF, patient is medically stable    DVT prophylaxis:  Enoxaparin   Code Status:    full  Family Communication:  No family at the bedside      Subjective: Patient with no nausea or vomiting, no chest pain or dyspnea, this am sleeping but yesterday as the day pass her mentation  improved.   Objective: Vitals:   06/23/21 0600 06/23/21 0656 06/23/21 0657 06/23/21 1405  BP:  (!) 147/85  (!) 154/83  Pulse:   88 95  Resp:  17  18  Temp:  99.1 F (37.3 C)  99 F (37.2 C)  TempSrc:  Oral  Oral  SpO2:   100% 100%  Weight: 109.9 kg     Height:        Intake/Output Summary (Last 24 hours) at 06/23/2021 1412 Last data filed at 06/23/2021 0912 Gross per 24 hour  Intake 2133.5 ml  Output 1000 ml  Net 1133.5 ml    Filed Weights   06/21/21 0500 06/22/21 0518 06/23/21 0600  Weight: 104.7 kg 108.8 kg 109.9 kg    Examination:   General: Not in pain or dyspnea.  Neurology: Awake Alert, Oriented X 2, not time, No new F.N deficits, Normal affect  E ENT: no pallor, no icterus, oral mucosa moist Cardiovascular: No JVD. S1-S2 present, rhythmic, no gallops, rubs, or murmurs. Right lower extremity edema. Pulmonary: vesicular breath sounds bilaterally, adequate air movement, no wheezing, rhonchi or rales. Gastrointestinal. Abdomen soft and non tender Skin. No rashes Musculoskeletal: no joint deformities   Data Reviewed: I have personally reviewed following labs and imaging studies  CBC: Recent Labs  Lab 06/19/21 2220 06/20/21 0515  WBC 8.2 7.5  NEUTROABS 5.2  --   HGB 9.4* 9.5*  HCT 29.6* 30.3*  MCV 77.9* 78.7*  PLT 263 419    Basic Metabolic Panel: Recent Labs  Lab 06/19/21 2220 06/20/21 0515 06/21/21 0653 06/22/21 0556 06/23/21 0530  NA 136 137 135 135 137  K 4.3 4.2 3.5 3.4* 3.8  CL 107 104 108 107 112*  CO2 23 25 21* 22 21*  GLUCOSE 117* 123* 171* 183* 127*  BUN 16 13 11 13 15   CREATININE 0.92 0.88 0.77 0.92 0.91  CALCIUM 10.6* 10.6* 10.5* 10.2 9.9  MG  --  1.3*  --   --   --   PHOS  --  3.0  --   --   --     GFR: Estimated Creatinine Clearance: 67 mL/min (by C-G formula based on SCr of 0.91 mg/dL). Liver Function Tests: Recent Labs  Lab 06/19/21 2220 06/20/21 0515  AST 16 16  ALT 13 14  ALKPHOS 62 66  BILITOT 0.5 0.4  PROT  7.4 7.3  ALBUMIN 3.5 3.5    Recent Labs  Lab 06/19/21 2220  LIPASE 32    Recent Labs  Lab 06/20/21 0515  AMMONIA 11    Coagulation Profile: Recent Labs  Lab 06/20/21 0515  INR 1.1    Cardiac Enzymes: Recent Labs  Lab 06/20/21 0515  CKTOTAL 219    BNP (last 3 results) No results for input(s): PROBNP in the last 8760 hours. HbA1C: No results for input(s): HGBA1C in the last 72 hours. CBG: Recent Labs  Lab 06/22/21 1152 06/22/21 1729 06/22/21 2112 06/23/21 0752 06/23/21 1139  GLUCAP 252* 266* 193* 160* 224*    Lipid Profile: No results for input(s): CHOL, HDL, LDLCALC, TRIG, CHOLHDL, LDLDIRECT in the last 72 hours. Thyroid Function Tests: No results for input(s): TSH, T4TOTAL, FREET4, T3FREE, THYROIDAB in the last 72 hours.  Anemia Panel: Recent Labs    06/21/21 0653  FOLATE 16.8  Radiology Studies: I have reviewed all of the imaging during this hospital visit personally    Scheduled Meds:  amLODipine  10 mg Oral Daily   docusate sodium  100 mg Oral BID   insulin aspart  0-6 Units Subcutaneous TID WC   multivitamins with iron  1 tablet Oral Daily   pantoprazole  40 mg Oral Daily   Ensure Max Protein  11 oz Oral BID   thiamine  250 mg Oral BID   Continuous Infusions:     LOS: 3 days     Gerrit Halls, MS4

## 2021-06-23 NOTE — TOC Initial Note (Signed)
Transition of Care [facility]) - Initial/Assessment Note    Patient Details  Name: Katelyn [name] MRN: [id number] Date of Birth: [birth year]  [facility]) CM/SW Contact:    Natasha Bence, LCSW Phone Number: 06/23/2021, 11:25 AM  Clinical Narrative:                 Patient is a 75 year old female admitted for Hypercalcemia. CSW conducted initial assessment. Patient's sister reported that patient is able to ambulate with min to mod assistance at baseline. Patient's sister presented HCPOA paperwork. Patient has been recommended for SNF. Patient's sister wishes for patient to participate in rehab and regain strength, then transition to LTC at chosen facility. Patient's sister not agreeable to return to previous facility due to reported concern about over medicating patient which is suspected to have lead to hospitalization. CSW faxed out patient and started authorization for [place] SNF's. TOC to follow.  Expected Discharge Plan: Skilled Nursing Facility Barriers to Discharge: Continued Medical Work up   Patient Goals and CMS Choice Patient states their goals for this hospitalization and ongoing recovery are:: Rehab with SNF CMS Medicare.gov Compare Post Acute Care list provided to:: Patient Choice offered to / list presented to : Patient  Expected Discharge Plan and Services Expected Discharge Plan: [place] Choice: [place] (LTAC) Living arrangements for the past 2 months: [place]                                      Prior Living Arrangements/Services Living arrangements for the past 2 months: [place] Lives with:: Facility Resident Patient language and need for interpreter reviewed:: Yes Do you feel safe going back to the place where you live?: Yes      Need for Family Participation in Patient Care: Yes (Comment) Care giver support system in place?: Yes  (comment)   Criminal Activity/Legal Involvement Pertinent to Current Situation/Hospitalization: No - Comment as needed  Activities of Daily Living Home Assistive Devices/Equipment: Blood pressure cuff, Grab bars around toilet, Grab bars in shower, Hand-held shower hose, Hospital bed, Scales, Walker (specify type), CBG Meter ADL Screening (condition at time of admission) Patient's cognitive ability adequate to safely complete daily activities?: No Is the patient deaf or have difficulty hearing?: No Does the patient have difficulty seeing, even when wearing glasses/contacts?: No Does the patient have difficulty concentrating, remembering, or making decisions?: Yes Patient able to express need for assistance with ADLs?: Yes Does the patient have difficulty dressing or bathing?: Yes Independently performs ADLs?: No (Secondary to increasing weakness) Communication: Independent Dressing (OT): Needs assistance Is this a change from baseline?: Pre-admission baseline Grooming: Needs assistance Is this a change from baseline?: Pre-admission baseline Feeding: Needs assistance Is this a change from baseline?: Pre-admission baseline Bathing: Needs assistance Is this a change from baseline?: Pre-admission baseline Toileting: Needs assistance Is this a change from baseline?: Change from baseline, expected to last >3days In/Out Bed: Needs assistance Is this a change from baseline?: Change from baseline, expected to last >3 days Walks in Home: Needs assistance Is this a change from baseline?: Change from baseline, expected to last >3 days Does the patient have difficulty walking or climbing stairs?: Yes (secondary to worsening weakness) Weakness of Legs: Both Weakness of Arms/Hands: None  Permission Sought/Granted Permission sought to share information with :  Family Supports Permission granted to share information with : Yes, Verbal Permission Granted  Share Information with NAME:  Harrison,Linda  Permission granted to share info w AGENCY: Local SNF  Permission granted to share info w Relationship: (Sister)  Permission granted to share info w Contact Information: [phone]  Emotional Assessment     Affect (typically observed): Accepting, Adaptable Orientation: : Oriented to Self, Oriented to Place, Oriented to Situation Alcohol / Substance Use: Not Applicable Psych Involvement: No (comment)  Admission diagnosis:  Weakness [R53.1] Generalized weakness [R53.1] Patient Active Problem List   Diagnosis Date Noted   Generalized weakness 06/20/2021   Constipation 06/20/2021   Weakness    Ventral hernia 06/29/2018   Dementia without behavioral disturbance ([place]) 06/29/2018   Dehydration 07/16/2017   Hypercalcemia 07/16/2017   Diabetic gastroparesis associated with type 2 diabetes mellitus ([place]) 07/16/2017   Asthma 07/15/2017   Diabetes mellitus without complication ([place]) 86/76/7209   GERD (gastroesophageal reflux disease) 07/15/2017   Hyperparathyroidism ([place]) 47/05/6282   Acute metabolic encephalopathy 66/29/4765   Abdominal pain 07/15/2017   Diabetic neuropathy ([place]) 05/07/2015   Achilles tendinitis of left lower extremity 03/30/2014   Spinal stenosis, lumbar region, with neurogenic claudication 02/16/2014   Other chronic postoperative pain 02/16/2014   Bilateral knee contractures 02/16/2014   Obstructive sleep apnea 06/26/2013   TRIGGER FINGER 04/29/2010   Allergic rhinitis, cause unspecified 03/24/2010   OSTEOARTHRITIS 02/10/2010   DENTAL CARIES 09/24/2009   DIABETIC  RETINOPATHY 04/29/2009   ONYCHOMYCOSIS, TOENAILS 04/08/2009   HLD (hyperlipidemia) 04/08/2009   EDEMA 04/08/2009   Essential hypertension 12/24/2008   ELECTROCARDIOGRAM, ABNORMAL 12/17/2008   SCIATICA, RIGHT 07/11/2007   DM2 (diabetes mellitus, type 2) ([place]) 04/11/2007   ANEMIA DUE TO DIETARY IRON DEFICIENCY 04/11/2007   OSTEOARTHRITIS, GENERALIZED 04/11/2007   PCP:  Sande Brothers, MD Pharmacy:  No Pharmacies Listed    Social Determinants of Health (SDOH) Interventions    Readmission Risk Interventions No flowsheet data found.

## 2021-06-23 NOTE — Progress Notes (Signed)
    OVERNIGHT PROGRESS REPORT  While charting in proximity of this patient room heard a noise and call for assistance from this patient. She had tilted her recliner forward and was sitting on (had slid down to) the extended footboard and the bottom wheel assembly of the bedside table.  She was in no apparent or stated distress otherwise. Patient was assisted off of the footrest and table wheels onto a bed sheet on the floor to remove the tilted chair and table. Patient was then able to stand with assistance of Nursing staff (from the floor to standing and ambulation to the bed in one motion).  She is returned to bed without difficulty and is A&O x 4 with no obvious or stated complaints.     Gershon Cull MSNA MSN ACNPC-AG Acute Care Nurse Practitioner [address]

## 2021-06-24 DIAGNOSIS — E119 Type 2 diabetes mellitus without complications: Secondary | ICD-10-CM | POA: Diagnosis not present

## 2021-06-24 DIAGNOSIS — G9341 Metabolic encephalopathy: Secondary | ICD-10-CM | POA: Diagnosis not present

## 2021-06-24 DIAGNOSIS — K5900 Constipation, unspecified: Secondary | ICD-10-CM | POA: Diagnosis not present

## 2021-06-24 DIAGNOSIS — R531 Weakness: Secondary | ICD-10-CM | POA: Diagnosis not present

## 2021-06-24 LAB — GLUCOSE, CAPILLARY
Glucose-Capillary: 223 mg/dL — ABNORMAL HIGH (ref 70–99)
Glucose-Capillary: 240 mg/dL — ABNORMAL HIGH (ref 70–99)
Glucose-Capillary: 277 mg/dL — ABNORMAL HIGH (ref 70–99)
Glucose-Capillary: 199 mg/dL — ABNORMAL HIGH (ref 70–99)

## 2021-06-24 LAB — BASIC METABOLIC PANEL
Anion gap: 5 (ref 5–15)
BUN: 18 mg/dL (ref 8–23)
CO2: 22 mmol/L (ref 22–32)
Calcium: 10.1 mg/dL (ref 8.9–10.3)
Chloride: 108 mmol/L (ref 98–111)
Creatinine, Ser: 0.88 mg/dL (ref 0.44–1.00)
GFR, Estimated: >60 mL/min (ref >60)
Glucose, Bld: 177 mg/dL — ABNORMAL HIGH (ref 70–99)
Potassium: 4.1 mmol/L (ref 3.5–5.1)
Sodium: 135 mmol/L (ref 135–145)

## 2021-06-24 LAB — MAGNESIUM: Magnesium: 1.7 mg/dL (ref 1.7–2.4)

## 2021-06-24 MED ORDER — INSULIN GLARGINE-YFGN 100 UNIT/ML ~~LOC~~ SOLN: 10.0000 [IU] | Freq: Every day | SUBCUTANEOUS | Status: DC

## 2021-06-24 MED ORDER — INSULIN GLARGINE-YFGN 100 UNIT/ML ~~LOC~~ SOLN
10.0000 [IU] | Freq: Every day | SUBCUTANEOUS | Status: DC
Start: 2021-06-24 — End: 2021-06-25
  Administered 2021-06-24 – 2021-06-25 (×2): 10 [IU] via SUBCUTANEOUS
  Filled 2021-06-24 (×2): qty 0.1

## 2021-06-24 NOTE — TOC Progression Note (Signed)
Transition of Care [facility]) - Progression Note    Patient Details  Name: Katelyn [name] MRN: [id number] Date of Birth: 25-Mar-1946  [facility]) CM/SW Contact  Natasha Bence, LCSW Phone Number: 06/24/2021, 12:37 PM  Clinical Narrative:    CSW followed up with [facility]. [facility] reported that they need to speak to MD. CSW provided MD with [facility] medical director [phone]. MD reported that Voicemail was left. [place] and [place] agreeable to review patient. TOC to follow.   Expected Discharge Plan: [place] Barriers to Discharge: Continued Medical Work up  Expected Discharge Plan and Services Expected Discharge Plan: [place] Choice: [place] (LTAC) Living arrangements for the past 2 months: Assisted Living Facility                                       Social Determinants of Health (SDOH) Interventions    Readmission Risk Interventions No flowsheet data found.

## 2021-06-24 NOTE — Progress Notes (Signed)
Physical Therapy Treatment Patient Details Name: Katelyn [name] MRN: [id number] DOB: [birth year] Today's Date: 06/24/2021   History of Present Illness 75 year old female from ALF admitted 06/19/21 with working diagnosis of generalized weakness and acute metabolic encephalopathy. PMH includes: type 2 diabetes mellitus, hypertension, hyperlipidemia, chronic anemia and dementia    PT Comments    Pt tolerates BLE strengthening exercises and standing marching with RW. Uses rocking momentum to power to stand with min A and rocking momentum. Pt reports "always" pain in bil knees upon arrival, denies pain with exercises and with steps. Pt's lunch arrived while standing marching and steps at bedside, declines further ambulation due to wanting to eat. Will continue to progress acute PT as able.   Recommendations for follow up therapy are one component of a multi-disciplinary discharge planning process, led by the attending physician.  Recommendations may be updated based on patient status, additional functional criteria and insurance authorization.  Follow Up Recommendations  Skilled nursing-short term rehab (<3 hours/day)     Assistance Recommended at Discharge    Equipment Recommendations  None recommended by PT    Recommendations for Other Services       Precautions / Restrictions Precautions Precautions: Fall Precaution Comments: sundowns per sister Restrictions Weight Bearing Restrictions: No     Mobility  Bed Mobility  General bed mobility comments: in recilner upon arrival    Transfers Overall transfer level: Needs assistance Equipment used: Rolling walker (2 wheels) Transfers: Sit to/from Stand Sit to Stand: Min assist  General transfer comment: rocking momentum to power up, slow to rise with hip extension last, VC for glute/quad activation    Ambulation/Gait  General Gait Details: pt performs standing marching and takes 3 steps forward/backwards in front of recliner,  declines further ambulation due to lunch arrival   Stairs             Wheelchair Mobility    Modified Rankin (Stroke Patients Only)       Balance Overall balance assessment: Needs assistance  Standing balance support: Bilateral upper extremity supported Standing balance-Leahy Scale: Poor Standing balance comment: reliant on UE support     Cognition Arousal/Alertness: Awake/alert Behavior During Therapy: WFL for tasks assessed/performed Overall Cognitive Status: History of cognitive impairments - at baseline  General Comments: easily distracted, but easily redirected        Exercises General Exercises - Lower Extremity Quad Sets: Seated;AROM;Strengthening;Both;10 reps Long Arc Quad: Seated;AROM;Strengthening;Both;10 reps Hip Flexion/Marching: Standing;AROM;Strengthening;Both;10 reps    General Comments        Pertinent Vitals/Pain Pain Assessment: Faces Faces Pain Scale: Hurts little more Pain Location: bil knees Pain Descriptors / Indicators: Sore ("always") Pain Intervention(s): Limited activity within patient's tolerance;Monitored during session;Repositioned    Home Living                          Prior Function            PT Goals (current goals can now be found in the care plan section) Acute Rehab PT Goals Patient Stated Goal: work with therapy PT Goal Formulation: With patient/family Time For Goal Achievement: 07/06/21 Potential to Achieve Goals: Good Progress towards PT goals: Progressing toward goals    Frequency    Min 2X/week      PT Plan Current plan remains appropriate    Co-evaluation              AM-PAC PT "6 Clicks" Mobility   Outcome Measure  Help needed  turning from your back to your side while in a flat bed without using bedrails?: A Little Help needed moving from lying on your back to sitting on the side of a flat bed without using bedrails?: A Little Help needed moving to and from a bed to a chair  (including a wheelchair)?: A Little Help needed standing up from a chair using your arms (e.g., wheelchair or bedside chair)?: A Little Help needed to walk in hospital room?: A Little Help needed climbing 3-5 steps with a railing? : A Lot 6 Click Score: 17    End of Session Equipment Utilized During Treatment: Gait belt Activity Tolerance: Patient tolerated treatment well;Patient limited by fatigue Patient left: in chair;with call bell/phone within reach;with chair alarm set Nurse Communication: Mobility status PT Visit Diagnosis: Difficulty in walking, not elsewhere classified (R26.2);Muscle weakness (generalized) (M62.81)     Time: 2836-6294 PT Time Calculation (min) (ACUTE ONLY): 13 min  Charges:  $Therapeutic Exercise: 8-22 mins                      Tori Yovanna Cogan PT, DPT 06/24/21, 12:35 PM

## 2021-06-24 NOTE — Progress Notes (Signed)
   06/23/21 2045  What Happened  Was fall witnessed? Yes  Who witnessed fall? Clarene Essex NP and Danielle NT  Patients activity before fall other (comment) (she was sitting in a recliner)  Point of contact other (comment) (she was lowered to the floor based on the report)  Was patient injured? No  Follow Up  MD notified Gershon Cull NP  Time MD notified 2045  Family notified  (no)  Additional tests No  Simple treatment Other (comment) (none)  Progress note created (see row info) Yes  Adult Fall Risk Assessment  Risk Factor Category (scoring not indicated) High fall risk per protocol (document High fall risk)  Patient Fall Risk Level High fall risk  Adult Fall Risk Interventions  Required Bundle Interventions *See Row Information* High fall risk - low, moderate, and high requirements implemented  Additional Interventions PT/OT need assessed if change in mobility from baseline;Use of appropriate toileting equipment (bedpan, BSC, etc.)  Screening for Fall Injury Risk (To be completed on HIGH fall risk patients) - Assessing Need for Floor Mats  Risk For Fall Injury- Criteria for Floor Mats Previous fall this admission  Will Implement Floor Mats Yes  Pain Assessment  Pain Scale 0-10  Pain Score 0    I was in another patient's room dealing with rapid response when I received a message that the patient had an assisted fall. See Gershon Cull NP notes for details as he was the one who responded to the chair alarm and patient's yelling for help.

## 2021-06-24 NOTE — Progress Notes (Signed)
PROGRESS NOTE    Katelyn [name]  IHK:[id number] DOB: Feb 27, 1946 DOA: 06/19/2021 PCP: Sande Brothers, MD    Brief Narrative:  Katelyn [name] was admitted to the hospital with working diagnosis of generalized weakness and acute metabolic encephalopathy.   75 year old female with past medical history for type 2 diabetes mellitus, hypertension, hyperlipidemia, chronic anemia and dementia who presented with generalized weakness.  She was noted to have worsening weakness for about 2-3 days at the skilled nursing facility decreased mobility and requiring increased assistance.  She had worsening confusion and episodic agitation that prompted her to be brought to the hospital. On her initial physical examination she was afebrile, heart rate 99-109, blood pressure 127/93, respiratory rate 16-27, oxygen saturation 96%, she was awake, she was unable to follow instructions but her strength was preserved upper and lower extremities.  Her lungs are clear to auscultation bilaterally, heart S1-S2, present, rhythmic, soft abdomen, no lower extremity edema.   Sodium 136, potassium 4.3, chloride 107, bicarb 23, glucose 117, BUN 16, creatinine 0.92, calcium 10.6, ammonia 11, white count 8.2, hemoglobin 9.4, hematocrit 39.6, platelets 263. SARS COVID-19 negative.   Urine analysis specific gravity 1.010, negative nitrates. Toxicology screen negative.   CT abdomen pelvis no acute findings. Head CT no acute changes.   Chest radiograph, no infiltrates.   EKG 107 bpm, left axis deviation, right bundle branch block, sinus rhythm, no significant ST segment or T wave changes.   Patient was placed on intravenous fluids for hypercalcemia, discontinue polypharmacy. Close neurochecks, consulted physical therapy and occupational therapy   10/25: Patient continues to endorse improvement in strength.  Patient continues to be medically stable.  Patient awaiting SNF approval. Plan to transfer to SNF when  approved.  Assessment & Plan:   Principal Problem:   Generalized weakness Active Problems:   DM2 (diabetes mellitus, type 2) (HCC)   HLD (hyperlipidemia)   Essential hypertension   Acute metabolic encephalopathy   Hypercalcemia   Constipation   Acute metabolic encephalopathy in the setting of dementia.   Today her mentation has improved and likely is back to her baseline.    Patient with advance dementia and acute encephalopathy, likely multifactorial.    Plan to continue neuro checks per unit protocol. Plan to transition to SNF when bed available.    2. Hypercalcemia, primary hyperparathyroidism/ hypokalemia. Suspected primary hyperparathyroidism, correction of calcium may improve patient's cognitive deficit.    PTH is 92, consistent with primary hyperparathyroidism. Her serum ca today is 10.1. K is 4.1, mg is 1.7 and serum bicarbonate at 22. Continue neuro checks per unit protocol.      3.  Hypertension.   Continue to hold on HCTZ, due to hypercalcemia. On amlodipine for blood pressure control     4.  Type 2 diabetes mellitus/ dyslipidemia.   Her fasting glucose this am is 127. Patient is more awake and tolerating po well.    5. Right lower extremity edema,  Lower extremity US was negative for DVT    Status is: Inpatient  Remains inpatient appropriate because: pending transfer to SNF, patient is medically stable    DVT prophylaxis:  Enoxaparin   Code Status:    full  Family Communication:   No family at the bedside      Subjective: Patient states that she feels well this morning.  She endorses improvement in strength.  Patient inquired about what SNF facility she will be going; no decision has been made at this time.  Patient denies chest pains,  palpitations, constipation, or SOB.  Objective: Vitals:   06/23/21 2114 06/23/21 2310 06/24/21 0615 06/24/21 1442  BP: 112/80 (!) 117/59 (!) 141/87 (!) 149/91  Pulse: 94 87 86 93  Resp: 17 18 18 16   Temp: 99.3  F (37.4 C) 99.3 F (37.4 C) (!) 97.3 F (36.3 C) 98.4 F (36.9 C)  TempSrc: Oral Oral Oral Oral  SpO2: 96% 99% 100% 100%  Weight:   110.8 kg   Height:        Intake/Output Summary (Last 24 hours) at 06/24/2021 1449 Last data filed at 06/24/2021 1400 Gross per 24 hour  Intake 720 ml  Output 200 ml  Net 520 ml   Filed Weights   06/22/21 0518 06/23/21 0600 06/24/21 0615  Weight: 108.8 kg 109.9 kg 110.8 kg    Examination:   General: Not in pain or dyspnea.  Neurology: Awake Alert, Oriented X 2, not time, No new F.N deficits, Normal affect  E ENT: no pallor, no icterus, oral mucosa moist Cardiovascular: No JVD. S1-S2 present, rhythmic, no gallops, rubs, or murmurs. Right lower extremity edema. Pulmonary: vesicular breath sounds bilaterally, adequate air movement, no wheezing, rhonchi or rales. Gastrointestinal. Abdomen soft and non tender Skin. No rashes Musculoskeletal: no joint deformities   Data Reviewed: I have personally reviewed following labs and imaging studies  CBC: Recent Labs  Lab 06/19/21 2220 06/20/21 0515  WBC 8.2 7.5  NEUTROABS 5.2  --   HGB 9.4* 9.5*  HCT 29.6* 30.3*  MCV 77.9* 78.7*  PLT 263 734   Basic Metabolic Panel: Recent Labs  Lab 06/20/21 0515 06/21/21 0653 06/22/21 0556 06/23/21 0530 06/24/21 0628  NA 137 135 135 137 135  K 4.2 3.5 3.4* 3.8 4.1  CL 104 108 107 112* 108  CO2 25 21* 22 21* 22  GLUCOSE 123* 171* 183* 127* 177*  BUN 13 11 13 15 18   CREATININE 0.88 0.77 0.92 0.91 0.88  CALCIUM 10.6* 10.5* 10.2 9.9 10.1  MG 1.3*  --   --   --  1.7  PHOS 3.0  --   --   --   --    GFR: Estimated Creatinine Clearance: 69.5 mL/min (by C-G formula based on SCr of 0.88 mg/dL). Liver Function Tests: Recent Labs  Lab 06/19/21 2220 06/20/21 0515  AST 16 16  ALT 13 14  ALKPHOS 62 66  BILITOT 0.5 0.4  PROT 7.4 7.3  ALBUMIN 3.5 3.5   Recent Labs  Lab 06/19/21 2220  LIPASE 32   Recent Labs  Lab 06/20/21 0515  AMMONIA 11    Coagulation Profile: Recent Labs  Lab 06/20/21 0515  INR 1.1   Cardiac Enzymes: Recent Labs  Lab 06/20/21 0515  CKTOTAL 219   BNP (last 3 results) No results for input(s): PROBNP in the last 8760 hours. HbA1C: No results for input(s): HGBA1C in the last 72 hours. CBG: Recent Labs  Lab 06/23/21 1139 06/23/21 1729 06/23/21 2210 06/24/21 0833 06/24/21 1152  GLUCAP 224* 209* 241* 223* 240*   Lipid Profile: No results for input(s): CHOL, HDL, LDLCALC, TRIG, CHOLHDL, LDLDIRECT in the last 72 hours. Thyroid Function Tests: No results for input(s): TSH, T4TOTAL, FREET4, T3FREE, THYROIDAB in the last 72 hours.  Anemia Panel: No results for input(s): VITAMINB12, FOLATE, FERRITIN, TIBC, IRON, RETICCTPCT in the last 72 hours.    Radiology Studies: I have reviewed all of the imaging during this hospital visit personally    Scheduled Meds:  amLODipine  10 mg Oral Daily  docusate sodium  100 mg Oral BID   insulin aspart  0-6 Units Subcutaneous TID WC   multivitamins with iron  1 tablet Oral Daily   pantoprazole  40 mg Oral Daily   Ensure Max Protein  11 oz Oral BID   thiamine  250 mg Oral BID   Continuous Infusions:     LOS: 4 days     Gerrit Halls, MS4

## 2021-06-24 NOTE — Progress Notes (Signed)
Notified [name] (patient's sister) of her  assisted fall with no injury. No complain received. Stated she will be coming to visit soon and should convey it to the patient.

## 2021-06-25 DIAGNOSIS — G9341 Metabolic encephalopathy: Secondary | ICD-10-CM | POA: Diagnosis not present

## 2021-06-25 DIAGNOSIS — J302 Other seasonal allergic rhinitis: Secondary | ICD-10-CM | POA: Diagnosis not present

## 2021-06-25 DIAGNOSIS — D649 Anemia, unspecified: Secondary | ICD-10-CM | POA: Diagnosis not present

## 2021-06-25 DIAGNOSIS — F02A0 Dementia in other diseases classified elsewhere, mild, without behavioral disturbance, psychotic disturbance, mood disturbance, and anxiety: Secondary | ICD-10-CM | POA: Diagnosis not present

## 2021-06-25 DIAGNOSIS — G4733 Obstructive sleep apnea (adult) (pediatric): Secondary | ICD-10-CM

## 2021-06-25 DIAGNOSIS — I10 Essential (primary) hypertension: Secondary | ICD-10-CM | POA: Diagnosis not present

## 2021-06-25 DIAGNOSIS — E038 Other specified hypothyroidism: Secondary | ICD-10-CM | POA: Diagnosis not present

## 2021-06-25 DIAGNOSIS — G9340 Encephalopathy, unspecified: Secondary | ICD-10-CM | POA: Diagnosis not present

## 2021-06-25 DIAGNOSIS — E039 Hypothyroidism, unspecified: Secondary | ICD-10-CM | POA: Diagnosis not present

## 2021-06-25 DIAGNOSIS — E118 Type 2 diabetes mellitus with unspecified complications: Secondary | ICD-10-CM | POA: Diagnosis not present

## 2021-06-25 DIAGNOSIS — Z7401 Bed confinement status: Secondary | ICD-10-CM | POA: Diagnosis not present

## 2021-06-25 DIAGNOSIS — E119 Type 2 diabetes mellitus without complications: Secondary | ICD-10-CM | POA: Diagnosis not present

## 2021-06-25 DIAGNOSIS — R5381 Other malaise: Secondary | ICD-10-CM | POA: Diagnosis not present

## 2021-06-25 DIAGNOSIS — M6281 Muscle weakness (generalized): Secondary | ICD-10-CM | POA: Diagnosis not present

## 2021-06-25 DIAGNOSIS — K5900 Constipation, unspecified: Secondary | ICD-10-CM | POA: Diagnosis not present

## 2021-06-25 DIAGNOSIS — E559 Vitamin D deficiency, unspecified: Secondary | ICD-10-CM | POA: Diagnosis not present

## 2021-06-25 DIAGNOSIS — E785 Hyperlipidemia, unspecified: Secondary | ICD-10-CM | POA: Diagnosis not present

## 2021-06-25 DIAGNOSIS — R2689 Other abnormalities of gait and mobility: Secondary | ICD-10-CM | POA: Diagnosis not present

## 2021-06-25 DIAGNOSIS — S88112D Complete traumatic amputation at level between knee and ankle, left lower leg, subsequent encounter: Secondary | ICD-10-CM | POA: Diagnosis not present

## 2021-06-25 DIAGNOSIS — Z79899 Other long term (current) drug therapy: Secondary | ICD-10-CM | POA: Diagnosis not present

## 2021-06-25 LAB — GLUCOSE, CAPILLARY
Glucose-Capillary: 150 mg/dL — ABNORMAL HIGH (ref 70–99)
Glucose-Capillary: 262 mg/dL — ABNORMAL HIGH (ref 70–99)
Glucose-Capillary: 271 mg/dL — ABNORMAL HIGH (ref 70–99)

## 2021-06-25 LAB — CULTURE, BLOOD (SINGLE)
Culture: NO GROWTH
Special Requests: ADEQUATE

## 2021-06-25 NOTE — Discharge Summary (Signed)
Physician Discharge Summary  Katelyn [name] MBB:[id number] DOB: 1946-05-13 DOA: 06/19/2021  PCP: Sande Brothers, MD  Admit date: 06/19/2021 Discharge date: 06/25/2021  Admitted From: SNF Disposition: SNF  Recommendations for Outpatient Follow-up:  F/u on calcium level in 1 wk  Home Health:  none-   Discharge Condition:  stable   CODE STATUS:  Full code   Diet recommendation:  heart healthy and diabetic Consultations: none  Procedures/Studies: none   Discharge Diagnoses:  Principal Problem:   Acute metabolic encephalopathy Active Problems:   DM2 (diabetes mellitus, type 2) ([place])   HLD (hyperlipidemia)   Essential hypertension   Obstructive sleep apnea   Hypercalcemia   Dementia without behavioral disturbance ([place])   Constipation     Brief Summary: 75 year old female with past medical history for type 2 diabetes mellitus, hypertension, hyperlipidemia, chronic anemia and dementia who presented with generalized weakness.  She was noted to have worsening weakness for about 2-3 days at the skilled nursing facility decreased mobility and requiring increased assistance.  She had worsening confusion and episodic agitation that prompted her to be brought to the hospital. On her initial physical examination she was afebrile, heart rate 99-109, blood pressure 127/93, respiratory rate 16-27, oxygen saturation 96%, she was awake, she was unable to follow instructions but her strength was preserved upper and lower extremities.  Her lungs are clear to auscultation bilaterally, heart S1-S2, present, rhythmic, soft abdomen, no lower extremity edema.   Sodium 136, potassium 4.3, chloride 107, bicarb 23, glucose 117, BUN 16, creatinine 0.92, calcium 10.6, ammonia 11, white count 8.2, hemoglobin 9.4, hematocrit 39.6, platelets 263. SARS COVID-19 negative.   Urine analysis specific gravity 1.010, negative nitrates. Toxicology screen negative.   CT abdomen pelvis no acute findings. Head CT  no acute changes.   Chest radiograph, no infiltrates.   EKG 107 bpm, left axis deviation, right bundle branch block, sinus rhythm, no significant ST segment or T wave changes.   Patient was placed on intravenous fluids for hypercalcemia, discontinue polypharmacy. Close neurochecks, consulted physical therapy and occupational therapy  Hospital Course:   Acute metabolic encephalopathy with underlying mild dementia -Felt to be secondary to polypharmacy and medications were adjusted (dc hydrocodone, oxycodone, Exelon and tramadol) - Her mentation has improved significantly and she is back to baseline  Hypercalcemia, primary hypothyroidism - Intact PTH found to be 92 - Calcium level found to be 10.6 with an albumin of 3.5 -Corrected calcium level is 11.0 -HCTZ has been discontinued and IV fluids given as hypercalcemia was felt to be partly due to dehydration and HCTZ - Calcium level has improved to 10.1  Hypertension - Continue amlodipine Diabetes mellitus type 2 -Hemoglobin A1C    Component Value Date/Time   HGBA1C 6.9 (H) 06/29/2018 0236  Continue home regimen  Right lower extremity edema - Venous duplex performed and patient found to be negative for DVT   Discharge Exam: Vitals:   06/24/21 2109 06/25/21 1343  BP: (!) 128/59 (!) 154/77  Pulse: 74 92  Resp: 12 16  Temp: 99.3 F (37.4 C) 99 F (37.2 C)  SpO2: 100% 100%   Vitals:   06/24/21 1442 06/24/21 2109 06/25/21 0600 06/25/21 1343  BP: (!) 149/91 (!) 128/59  (!) 154/77  Pulse: 93 74  92  Resp: 16 12  16   Temp: 98.4 F (36.9 C) 99.3 F (37.4 C)  99 F (37.2 C)  TempSrc: Oral Oral  Oral  SpO2: 100% 100%  100%  Weight:   108.9 kg  Height:        General: Pt is alert, awake, not in acute distress Cardiovascular: RRR, S1/S2 +, no rubs, no gallops Respiratory: CTA bilaterally, no wheezing, no rhonchi Abdominal: Soft, NT, ND, bowel sounds + Extremities: no edema, no cyanosis   Discharge  Instructions  Discharge Instructions     Diet - low sodium heart healthy   Complete by: As directed    Diet Carb Modified   Complete by: As directed    Increase activity slowly   Complete by: As directed       Allergies as of 06/25/2021       Reactions   Aspirin Other (See Comments)   Stomach irritation   Hydrochlorothiazide    REACTION: 2 trials of HCTZ with pt intolerance secondary to dizzines   Ibuprofen Other (See Comments)    Causes "shakiness"   Latex Itching   Irritates, Redness   Tramadol    Stomach upset        Medication List     STOP taking these medications    albuterol 108 (90 Base) MCG/ACT inhaler Commonly known as: VENTOLIN HFA   diclofenac sodium 1 % Gel Commonly known as: VOLTAREN   esomeprazole 40 MG capsule Commonly known as: NEXIUM   famotidine 20 MG tablet Commonly known as: PEPCID   hydrochlorothiazide 25 MG tablet Commonly known as: HYDRODIURIL   HYDROcodone-acetaminophen 5-325 MG tablet Commonly known as: Norco   loperamide 2 MG capsule Commonly known as: IMODIUM   oxyCODONE 5 MG immediate release tablet Commonly known as: Oxy IR/ROXICODONE   potassium chloride 10 MEQ tablet Commonly known as: KLOR-CON   rivastigmine 1.5 MG capsule Commonly known as: EXELON   traMADol 50 MG tablet Commonly known as: ULTRAM       TAKE these medications    acetaminophen 500 MG tablet Commonly known as: TYLENOL Take 500 mg by mouth every 4 (four) hours as needed for headache. Do not exceed 2000mg    alum & mag hydroxide-simeth 200-200-20 MG/5ML suspension Commonly known as: MAALOX/MYLANTA Take 30 mLs by mouth every 6 (six) hours as needed for indigestion or heartburn.   amLODipine 10 MG tablet Commonly known as: NORVASC Take 10 mg by mouth daily.   aspirin EC 81 MG tablet Take 81 mg by mouth daily. What changed: Another medication with the same name was removed. Continue taking this medication, and follow the directions you see  here.   b complex vitamins capsule Take 1 capsule by mouth daily.   [employer] D3 125 MCG (5000 UT) capsule Generic drug: Cholecalciferol Take 5,000 Units by mouth daily.   febuxostat 40 MG tablet Commonly known as: ULORIC Take 40 mg by mouth daily.   guaifenesin 100 MG/5ML syrup Commonly known as: ROBITUSSIN Take 200 mg by mouth every 6 (six) hours as needed for cough.   Lantus SoloStar 100 UNIT/ML Solostar Pen Generic drug: insulin glargine Inject 10 Units into the skin every evening.   magnesium hydroxide 400 MG/5ML suspension Commonly known as: MILK OF MAGNESIA Take 30 mLs by mouth at bedtime as needed for mild constipation.   metFORMIN 1000 MG tablet Commonly known as: GLUCOPHAGE Take 1,000 mg by mouth 2 (two) times daily with a meal.   mometasone 50 MCG/ACT nasal spray Commonly known as: NASONEX Place 2 sprays into the nose daily.   neomycin-bacitracin-polymyxin [id number] ointment Apply 1 application topically 4 (four) times daily as needed (minor skin tears/abrasions).   polyethylene glycol 17 g packet Commonly known as: MIRALAX / GLYCOLAX  Take 17 g by mouth daily as needed for mild constipation.   rosuvastatin 5 MG tablet Commonly known as: CRESTOR Take 5 mg by mouth at bedtime.        Allergies  Allergen Reactions   Aspirin Other (See Comments)    Stomach irritation   Hydrochlorothiazide     REACTION: 2 trials of HCTZ with pt intolerance secondary to dizzines   Ibuprofen Other (See Comments)     Causes "shakiness"   Latex Itching    Irritates, Redness   Tramadol     Stomach upset      CT Abdomen Pelvis Wo Contrast  Result Date: 06/19/2021 CLINICAL DATA:  Increasing lethargy weakness. Acute nonlocalized abdominal pain. EXAM: CT ABDOMEN AND PELVIS WITHOUT CONTRAST TECHNIQUE: Multidetector CT imaging of the abdomen and pelvis was performed following the standard protocol without IV contrast. COMPARISON:  June 29, 2018 FINDINGS: Lower chest:  Bibasilar atelectasis. Hepatobiliary: Unremarkable noncontrast appearance of the hepatic parenchyma. Gallbladder is unremarkable. No biliary ductal dilation. Pancreas: No pancreatic ductal dilation or evidence of acute inflammation. Spleen: Within normal limits. Adrenals/Urinary Tract: Bilateral adrenal glands are unremarkable. No hydronephrosis. No renal, ureteral or bladder calculi visualized. Urinary bladder is unremarkable for degree of distension. Stomach/Bowel: No enteric contrast was administered. Stomach is predominantly decompressed limiting evaluation. No pathologic dilation large or small bowel. The appendix and terminal ileum appear normal. No evidence of acute bowel inflammation. Moderate volume formed stool throughout the colon. Vascular/Lymphatic: Aortic and branch vessel atherosclerosis without abdominal aortic aneurysm. No pathologically enlarged abdominal or pelvic lymph nodes. Reproductive: Calcified uterine leiomyomas. No suspicious adnexal mass. Other: No significant abdominopelvic free fluid. Small fat containing periumbilical hernia. Musculoskeletal: Anterior wedging deformity of the L2 vertebral body is unchanged from prior. Multilevel degenerative changes spine. No acute osseous abnormality. IMPRESSION: Study is degraded by motion, within this context: 1. Acute abdominopelvic findings. 2. Moderate volume formed stool throughout the colon. Correlate for constipation. 3. Aortic Atherosclerosis (ICD10-I70.0). Electronically Signed   By: Dahlia Bailiff M.D.   On: 06/19/2021 18:09   DG Chest 2 View  Result Date: 06/19/2021 CLINICAL DATA:  Weakness EXAM: CHEST - 2 VIEW COMPARISON:  08/31/2017 FINDINGS: The heart size and mediastinal contours are within normal limits. Both lungs are clear. The visualized skeletal structures are unremarkable. IMPRESSION: No active cardiopulmonary disease. Electronically Signed   By: Randa Ngo M.D.   On: 06/19/2021 18:02   CT Head Wo Contrast  Result  Date: 06/19/2021 CLINICAL DATA:  Altered mental status EXAM: CT HEAD WITHOUT CONTRAST TECHNIQUE: Contiguous axial images were obtained from the base of the skull through the vertex without intravenous contrast. COMPARISON:  07/15/2017 FINDINGS: Brain: No evidence of acute infarction, hemorrhage, hydrocephalus, extra-axial collection or mass lesion/mass effect. Subcortical white matter and periventricular small vessel ischemic changes. Vascular: No hyperdense vessel or unexpected calcification. Skull: Normal. Negative for fracture or focal lesion. Sinuses/Orbits: The visualized paranasal sinuses are essentially clear. The mastoid air cells are unopacified. Other: None. IMPRESSION: No evidence of acute intracranial abnormality. Small vessel ischemic changes. Electronically Signed   By: Julian Hy M.D.   On: 06/19/2021 23:40   VAS [place] LOWER EXTREMITY VENOUS (DVT)  Result Date: 06/22/2021  Lower Venous DVT Study Patient Name:  Katelyn [name]  Date of Exam:   06/21/2021 Medical Rec #: [id number]          Accession #:    [id number] Date of Birth: November 29, 1945          Patient Gender: F  Patient Age:   60 years Exam Location:  [facility] Procedure:      VAS [place] LOWER EXTREMITY VENOUS (DVT) Referring Phys: Sander Radon --------------------------------------------------------------------------------  Indications: Edema.  Risk Factors: Cancer yes. Comparison Study: no prior Performing Technologist: Antonieta Pert RDMS, RVT  Examination Guidelines: A complete evaluation includes B-mode imaging, spectral Doppler, color Doppler, and power Doppler as needed of all accessible portions of each vessel. Bilateral testing is considered an integral part of a complete examination. Limited examinations for reoccurring indications may be performed as noted. The reflux portion of the exam is performed with the patient in reverse Trendelenburg.   +---------+---------------+---------+-----------+----------+-----------------+ RIGHT    CompressibilityPhasicitySpontaneityPropertiesThrombus Aging    +---------+---------------+---------+-----------+----------+-----------------+ CFV      Full           Yes      Yes                                    +---------+---------------+---------+-----------+----------+-----------------+ SFJ      Full                                                           +---------+---------------+---------+-----------+----------+-----------------+ FV Prox  Full                                                           +---------+---------------+---------+-----------+----------+-----------------+ FV Mid   Full                                                           +---------+---------------+---------+-----------+----------+-----------------+ FV DistalFull                                                           +---------+---------------+---------+-----------+----------+-----------------+ PFV      Full                                                           +---------+---------------+---------+-----------+----------+-----------------+ POP      Full           Yes      Yes                                    +---------+---------------+---------+-----------+----------+-----------------+ PTV      Full  poorly visualized +---------+---------------+---------+-----------+----------+-----------------+ PERO     Full                                         poorly visualized +---------+---------------+---------+-----------+----------+-----------------+   +---------+---------------+---------+-----------+----------+-----------------+ LEFT     CompressibilityPhasicitySpontaneityPropertiesThrombus Aging    +---------+---------------+---------+-----------+----------+-----------------+ CFV      Full           Yes      Yes                                     +---------+---------------+---------+-----------+----------+-----------------+ SFJ      Full                                                           +---------+---------------+---------+-----------+----------+-----------------+ FV Prox  Full                                                           +---------+---------------+---------+-----------+----------+-----------------+ FV Mid   Full                                                           +---------+---------------+---------+-----------+----------+-----------------+ FV DistalFull                                                           +---------+---------------+---------+-----------+----------+-----------------+ PFV      Full                                                           +---------+---------------+---------+-----------+----------+-----------------+ POP      Full           Yes      Yes                                    +---------+---------------+---------+-----------+----------+-----------------+ PTV      Full                                         poorly visualized +---------+---------------+---------+-----------+----------+-----------------+ PERO     Full  poorly visualized +---------+---------------+---------+-----------+----------+-----------------+     Summary: RIGHT: - There is no evidence of deep vein thrombosis in the lower extremity. However, portions of this examination were limited- see technologist comments above.  - No cystic structure found in the popliteal fossa.  LEFT: - There is no evidence of deep vein thrombosis in the lower extremity. However, portions of this examination were limited- see technologist comments above.  - No cystic structure found in the popliteal fossa.  *See table(s) above for measurements and observations. Electronically signed by Deitra Mayo MD on 06/22/2021 at 6:26:45 AM.     Final      The results of significant diagnostics from this hospitalization (including imaging, microbiology, ancillary and laboratory) are listed below for reference.     Microbiology: Recent Results (from the past 240 hour(s))  Resp Panel by RT-PCR (Flu A&B, Covid) Nasopharyngeal Swab     Status: None   Collection Time: 06/19/21  4:57 PM   Specimen: Nasopharyngeal Swab; Nasopharyngeal(NP) swabs in vial transport medium  Result Value Ref Range Status   SARS Coronavirus 2 by RT PCR NEGATIVE NEGATIVE Final    Comment: (NOTE) SARS-CoV-2 target nucleic acids are NOT DETECTED.  The SARS-CoV-2 RNA is generally detectable in upper respiratory specimens during the acute phase of infection. The lowest concentration of SARS-CoV-2 viral copies this assay can detect is 138 copies/mL. A negative result does not preclude SARS-Cov-2 infection and should not be used as the sole basis for treatment or other patient management decisions. A negative result may occur with  improper specimen collection/handling, submission of specimen other than nasopharyngeal swab, presence of viral mutation(s) within the areas targeted by this assay, and inadequate number of viral copies(<138 copies/mL). A negative result must be combined with clinical observations, patient history, and epidemiological information. The expected result is Negative.  Fact Sheet for Patients:  EntrepreneurPulse.com.au  Fact Sheet for Healthcare Providers:  IncredibleEmployment.be  This test is no t yet approved or cleared by the [place] FDA and  has been authorized for detection and/or diagnosis of SARS-CoV-2 by FDA under an Emergency Use Authorization (EUA). This EUA will remain  in effect (meaning this test can be used) for the duration of the COVID-19 declaration under Section 564(b)(1) of the Act, 21 U.S.C.section 360bbb-3(b)(1), unless the authorization is terminated  or revoked  sooner.       Influenza A by PCR NEGATIVE NEGATIVE Final   Influenza B by PCR NEGATIVE NEGATIVE Final    Comment: (NOTE) The Xpert Xpress SARS-CoV-2/FLU/RSV plus assay is intended as an aid in the diagnosis of influenza from Nasopharyngeal swab specimens and should not be used as a sole basis for treatment. Nasal washings and aspirates are unacceptable for Xpert Xpress SARS-CoV-2/FLU/RSV testing.  Fact Sheet for Patients: EntrepreneurPulse.com.au  Fact Sheet for Healthcare Providers: IncredibleEmployment.be  This test is not yet approved or cleared by the [place] FDA and has been authorized for detection and/or diagnosis of SARS-CoV-2 by FDA under an Emergency Use Authorization (EUA). This EUA will remain in effect (meaning this test can be used) for the duration of the COVID-19 declaration under Section 564(b)(1) of the Act, 21 U.S.C. section 360bbb-3(b)(1), unless the authorization is terminated or revoked.  Performed at [facility], [place] [address], [place], [place] [address]   Culture, blood (single) w Reflex to ID Panel     Status: None   Collection Time: 06/20/21  5:15 AM   Specimen: BLOOD  Result Value Ref Range Status  Specimen Description   Final    BLOOD LEFT ANTECUBITAL Performed at [facility] [address] [place], [place] [address]    Special Requests   Final    BOTTLES DRAWN AEROBIC AND ANAEROBIC Blood Culture adequate volume Performed at [facility] [address], [place], [place] [address]    Culture   Final    NO GROWTH 5 DAYS Performed at [facility], [place] [address] [place], [place] [address]    Report Status 06/25/2021 FINAL  Final     Labs: BNP (last 3 results) No results for input(s): BNP in the last 8760 hours. Basic Metabolic Panel: Recent Labs  Lab 06/20/21 0515 06/21/21 0653 06/22/21 0556 06/23/21 0530 06/24/21 0628  NA  137 135 135 137 135  K 4.2 3.5 3.4* 3.8 4.1  CL 104 108 107 112* 108  CO2 25 21* 22 21* 22  GLUCOSE 123* 171* 183* 127* 177*  BUN 13 11 13 15 18   CREATININE 0.88 0.77 0.92 0.91 0.88  CALCIUM 10.6* 10.5* 10.2 9.9 10.1  MG 1.3*  --   --   --  1.7  PHOS 3.0  --   --   --   --    Liver Function Tests: Recent Labs  Lab 06/19/21 2220 06/20/21 0515  AST 16 16  ALT 13 14  ALKPHOS 62 66  BILITOT 0.5 0.4  PROT 7.4 7.3  ALBUMIN 3.5 3.5   Recent Labs  Lab 06/19/21 2220  LIPASE 32   Recent Labs  Lab 06/20/21 0515  AMMONIA 11   CBC: Recent Labs  Lab 06/19/21 2220 06/20/21 0515  WBC 8.2 7.5  NEUTROABS 5.2  --   HGB 9.4* 9.5*  HCT 29.6* 30.3*  MCV 77.9* 78.7*  PLT 263 269   Cardiac Enzymes: Recent Labs  Lab 06/20/21 0515  CKTOTAL 219   BNP: Invalid input(s): POCBNP CBG: Recent Labs  Lab 06/24/21 1152 06/24/21 1634 06/24/21 2117 06/25/21 0754 06/25/21 1131  GLUCAP 240* 277* 199* 150* 262*   D-Dimer No results for input(s): DDIMER in the last 72 hours. Hgb A1c No results for input(s): HGBA1C in the last 72 hours. Lipid Profile No results for input(s): CHOL, HDL, LDLCALC, TRIG, CHOLHDL, LDLDIRECT in the last 72 hours. Thyroid function studies No results for input(s): TSH, T4TOTAL, T3FREE, THYROIDAB in the last 72 hours.  Invalid input(s): FREET3 Anemia work up No results for input(s): VITAMINB12, FOLATE, FERRITIN, TIBC, IRON, RETICCTPCT in the last 72 hours. Urinalysis    Component Value Date/Time   COLORURINE YELLOW 06/19/2021 2306   APPEARANCEUR CLEAR 06/19/2021 2306   LABSPEC 1.010 06/19/2021 2306   PHURINE 7.0 06/19/2021 2306   GLUCOSEU NEGATIVE 06/19/2021 2306   HGBUR TRACE (A) 06/19/2021 2306   HGBUR negative 12/23/2009 1008   BILIRUBINUR NEGATIVE 06/19/2021 2306   KETONESUR NEGATIVE 06/19/2021 2306   PROTEINUR NEGATIVE 06/19/2021 2306   UROBILINOGEN 0.2 04/15/2011 0951   NITRITE NEGATIVE 06/19/2021 2306   LEUKOCYTESUR NEGATIVE 06/19/2021  2306   Sepsis Labs Invalid input(s): PROCALCITONIN,  WBC,  LACTICIDVEN Microbiology Recent Results (from the past 240 hour(s))  Resp Panel by RT-PCR (Flu A&B, Covid) Nasopharyngeal Swab     Status: None   Collection Time: 06/19/21  4:57 PM   Specimen: Nasopharyngeal Swab; Nasopharyngeal(NP) swabs in vial transport medium  Result Value Ref Range Status   SARS Coronavirus 2 by RT PCR NEGATIVE NEGATIVE Final    Comment: (NOTE) SARS-CoV-2 target nucleic acids are NOT  DETECTED.  The SARS-CoV-2 RNA is generally detectable in upper respiratory specimens during the acute phase of infection. The lowest concentration of SARS-CoV-2 viral copies this assay can detect is 138 copies/mL. A negative result does not preclude SARS-Cov-2 infection and should not be used as the sole basis for treatment or other patient management decisions. A negative result may occur with  improper specimen collection/handling, submission of specimen other than nasopharyngeal swab, presence of viral mutation(s) within the areas targeted by this assay, and inadequate number of viral copies(<138 copies/mL). A negative result must be combined with clinical observations, patient history, and epidemiological information. The expected result is Negative.  Fact Sheet for Patients:  EntrepreneurPulse.com.au  Fact Sheet for Healthcare Providers:  IncredibleEmployment.be  This test is no t yet approved or cleared by the [place] FDA and  has been authorized for detection and/or diagnosis of SARS-CoV-2 by FDA under an Emergency Use Authorization (EUA). This EUA will remain  in effect (meaning this test can be used) for the duration of the COVID-19 declaration under Section 564(b)(1) of the Act, 21 U.S.C.section 360bbb-3(b)(1), unless the authorization is terminated  or revoked sooner.       Influenza A by PCR NEGATIVE NEGATIVE Final   Influenza B by PCR NEGATIVE NEGATIVE Final     Comment: (NOTE) The Xpert Xpress SARS-CoV-2/FLU/RSV plus assay is intended as an aid in the diagnosis of influenza from Nasopharyngeal swab specimens and should not be used as a sole basis for treatment. Nasal washings and aspirates are unacceptable for Xpert Xpress SARS-CoV-2/FLU/RSV testing.  Fact Sheet for Patients: EntrepreneurPulse.com.au  Fact Sheet for Healthcare Providers: IncredibleEmployment.be  This test is not yet approved or cleared by the [place] FDA and has been authorized for detection and/or diagnosis of SARS-CoV-2 by FDA under an Emergency Use Authorization (EUA). This EUA will remain in effect (meaning this test can be used) for the duration of the COVID-19 declaration under Section 564(b)(1) of the Act, 21 U.S.C. section 360bbb-3(b)(1), unless the authorization is terminated or revoked.  Performed at [facility], [place] [address], [place], [place] [address]   Culture, blood (single) w Reflex to ID Panel     Status: None   Collection Time: 06/20/21  5:15 AM   Specimen: BLOOD  Result Value Ref Range Status   Specimen Description   Final    BLOOD LEFT ANTECUBITAL Performed at [facility] [address], [place], [place] [address]    Special Requests   Final    BOTTLES DRAWN AEROBIC AND ANAEROBIC Blood Culture adequate volume Performed at [facility] [address] [place], [place] [address]    Culture   Final    NO GROWTH 5 DAYS Performed at [facility], [place] [address] [address], [place], [place] [address]    Report Status 06/25/2021 FINAL  Final     Time coordinating discharge in minutes: 65  SIGNED:   Debbe Odea, MD  [facility] 06/25/2021, 2:13 PM

## 2021-06-25 NOTE — Progress Notes (Signed)
Report called to Presenter, broadcasting at [facility]. All belongings to be sent with pt at time of discharge. Chennetta to call pt's sister, Vaughan Basta, when pt arrives at facility.

## 2021-06-25 NOTE — Progress Notes (Signed)
Occupational Therapy Treatment Patient Details Name: Katelyn [name] MRN: [id number] DOB: 09-20-1945 Today's Date: 06/25/2021   History of present illness 75 year old female from ALF admitted 06/19/21 with working diagnosis of generalized weakness and acute metabolic encephalopathy. PMH includes: type 2 diabetes mellitus, hypertension, hyperlipidemia, chronic anemia and dementia   OT comments  Pt with progress towards goals this session. Pt A&O x2, focused session on increasing activity tolerance. Pt performed functional mobility to sink with min guard to perform grooming with min guard and use of RW. Pt then with functional mobility to bathroom and performed toilet transfer and hygiene with min guard for safety and min verbal cues for correct hand placement with RW. Pt reporting no SOB. Continue POC and will continue to follow in the acute setting.    Recommendations for follow up therapy are one component of a multi-disciplinary discharge planning process, led by the attending physician.  Recommendations may be updated based on patient status, additional functional criteria and insurance authorization.    Follow Up Recommendations  Other (comment)    Assistance Recommended at Discharge  (return to care facility, get OT services there (HHOT at assisted living or through memory care unit at [facility]))  Equipment Recommendations  Other (comment) (defer to next venue)    Recommendations for Other Services      Precautions / Restrictions Precautions Precautions: Fall Precaution Comments: sundowns per sister Restrictions Weight Bearing Restrictions: No       Mobility Bed Mobility Overal bed mobility: Modified Independent             General bed mobility comments: used bed rail and HOB raised, inc time    Transfers Overall transfer level: Needs assistance Equipment used: Rolling walker (2 wheels) Transfers: Sit to/from Stand Sit to Stand: Min assist           General  transfer comment: slow to rise     Balance Overall balance assessment: Needs assistance         Standing balance support: Bilateral upper extremity supported Standing balance-Leahy Scale: Poor Standing balance comment: reliant on UE support                           ADL either performed or assessed with clinical judgement   ADL Overall ADL's : Needs assistance/impaired     Grooming: min guard;Wash/dry hands;Wash/dry face;Sitting Grooming Details (indicate cue type and reason): Pt required 1 verbal cue to use wash cloth to wash face.             Lower Body Dressing: Supervision/safety;Cueing for sequencing Lower Body Dressing Details (indicate cue type and reason): required 1 verbal cue to donn R sock after L, attempting to remove L sock after donning it. Toilet Transfer: Agricultural engineer (2 wheels) Toilet Transfer Details (indicate cue type and reason): min vc for safe hand placement with RW Toileting- Clothing Manipulation and Hygiene: Min guard;Sitting/lateral lean Toileting - Clothing Manipulation Details (indicate cue type and reason): performed perineal care with lateral leans.     Functional mobility during ADLs: Min guard;Rolling walker (2 wheels)       Vision       Perception     Praxis      Cognition Arousal/Alertness: Awake/alert Behavior During Therapy: WFL for tasks assessed/performed Overall Cognitive Status: History of cognitive impairments - at baseline  General Comments: easily distracted and redirected, STM and problem solving deficits and dementia at baseline.           Exercises     Shoulder Instructions       General Comments      Pertinent Vitals/ Pain       Pain Assessment: Faces Faces Pain Scale: Hurts little more Pain Location: L knee and hip Pain Descriptors / Indicators: Aching Pain Intervention(s): Monitored during session  Home Living Family/patient  expects to be discharged to:: Assisted living                                        Prior Functioning/Environment              Frequency  Min 2X/week        Progress Toward Goals  OT Goals(current goals can now be found in the care plan section)  Progress towards OT goals: Progressing toward goals  Acute Rehab OT Goals OT Goal Formulation: With patient/family Time For Goal Achievement: 07/07/21 Potential to Achieve Goals: Good ADL Goals Pt Will Perform Grooming: with set-up;standing Pt Will Perform Upper Body Dressing: with min guard assist;sitting Pt Will Transfer to Toilet: with supervision;ambulating Pt Will Perform Toileting - Clothing Manipulation and hygiene: with supervision;sit to/from stand;sitting/lateral leans Additional ADL Goal #1: Pt will perform bed mobility at supervision level prior to engaging in ADL  Plan Discharge plan remains appropriate    Co-evaluation                 AM-PAC OT "6 Clicks" Daily Activity     Outcome Measure   Help from another person eating meals?: None Help from another person taking care of personal grooming?: A Little Help from another person toileting, which includes using toliet, bedpan, or urinal?: A Little Help from another person bathing (including washing, rinsing, drying)?: A Lot Help from another person to put on and taking off regular upper body clothing?: A Little Help from another person to put on and taking off regular lower body clothing?: A Little 6 Click Score: 18    End of Session Equipment Utilized During Treatment: Gait belt;Rolling walker (2 wheels)  OT Visit Diagnosis: Muscle weakness (generalized) (M62.81);Other symptoms and signs involving cognitive function   Activity Tolerance Patient tolerated treatment well   Patient Left in chair;with call bell/phone within reach;with chair alarm set;with family/visitor present   Nurse Communication Mobility status (donned brief.)         Time: 7858-8502 OT Time Calculation (min): 27 min  Charges: OT General Charges $OT Visit: 1 Visit OT Treatments $Self Care/Home Management : 23-37 mins  Jackquline Denmark, OTS Acute Rehab Office: [phone]   Angelyne Terwilliger 06/25/2021, 10:16 AM

## 2021-06-25 NOTE — TOC Transition Note (Signed)
Transition of Care [facility]) - CM/SW Discharge Note   Patient Details  Name: BROOKELYNNE DIMPERIO MRN: [id number] Date of Birth: 24-Oct-1945  [facility]) CM/SW Contact:  Lynnell Catalan, RN Phone Number: 06/25/2021, 2:03 PM   Clinical Narrative:    [facility] liaison alerted TOC of auth approval for SNF for 7 days [phone]). [facility] also approved for transport to SNF [phone]).   SNF bed offer provided to sister Vaughan Basta. Vaughan Basta accepts SNF bed at [facility]. Pt to dc to [facility] room 124B today. [facility] to be contacted for transport and RN to call report to [phone].   Final next level of care: Skilled Nursing Facility Barriers to Discharge: No Barriers Identified   Patient Goals and CMS Choice Patient states their goals for this hospitalization and ongoing recovery are:: Rehab with SNF CMS Medicare.gov Compare Post Acute Care list provided to:: Patient Represenative (must comment) (Sister) Choice offered to / list presented to : Patient  Discharge Placement              Patient chooses bed at: Other - please specify in the comment section below: [facility]) Patient to be transferred to facility by: [place] Name of family member notified: Vaughan Basta Patient and family notified of of transfer: 06/25/21  Discharge Plan and Services     Post Acute Care Choice: [place], [place] ([facility])                               Social Determinants of Health (SDOH) Interventions     Readmission Risk Interventions No flowsheet data found.

## 2021-06-26 DIAGNOSIS — I10 Essential (primary) hypertension: Secondary | ICD-10-CM | POA: Diagnosis not present

## 2021-06-26 DIAGNOSIS — D649 Anemia, unspecified: Secondary | ICD-10-CM | POA: Diagnosis not present

## 2021-06-27 DIAGNOSIS — E785 Hyperlipidemia, unspecified: Secondary | ICD-10-CM | POA: Diagnosis not present

## 2021-06-27 DIAGNOSIS — J302 Other seasonal allergic rhinitis: Secondary | ICD-10-CM | POA: Diagnosis not present

## 2021-06-27 DIAGNOSIS — E039 Hypothyroidism, unspecified: Secondary | ICD-10-CM | POA: Diagnosis not present

## 2021-06-27 DIAGNOSIS — K5900 Constipation, unspecified: Secondary | ICD-10-CM | POA: Diagnosis not present

## 2021-06-27 DIAGNOSIS — E118 Type 2 diabetes mellitus with unspecified complications: Secondary | ICD-10-CM | POA: Diagnosis not present

## 2021-06-27 DIAGNOSIS — E559 Vitamin D deficiency, unspecified: Secondary | ICD-10-CM | POA: Diagnosis not present

## 2021-06-27 DIAGNOSIS — G9340 Encephalopathy, unspecified: Secondary | ICD-10-CM | POA: Diagnosis not present

## 2021-06-27 LAB — METHYLMALONIC ACID, SERUM: Methylmalonic Acid, Quantitative: 121 nmol/L (ref 0–378)

## 2021-07-01 DIAGNOSIS — E119 Type 2 diabetes mellitus without complications: Secondary | ICD-10-CM | POA: Diagnosis not present

## 2021-07-03 DIAGNOSIS — R5381 Other malaise: Secondary | ICD-10-CM | POA: Diagnosis not present

## 2021-07-03 DIAGNOSIS — E559 Vitamin D deficiency, unspecified: Secondary | ICD-10-CM | POA: Diagnosis not present

## 2021-07-03 DIAGNOSIS — E039 Hypothyroidism, unspecified: Secondary | ICD-10-CM | POA: Diagnosis not present

## 2021-07-03 DIAGNOSIS — J302 Other seasonal allergic rhinitis: Secondary | ICD-10-CM | POA: Diagnosis not present

## 2021-07-03 DIAGNOSIS — K5900 Constipation, unspecified: Secondary | ICD-10-CM | POA: Diagnosis not present

## 2021-07-03 DIAGNOSIS — E118 Type 2 diabetes mellitus with unspecified complications: Secondary | ICD-10-CM | POA: Diagnosis not present

## 2021-07-03 DIAGNOSIS — E785 Hyperlipidemia, unspecified: Secondary | ICD-10-CM | POA: Diagnosis not present

## 2021-07-03 DIAGNOSIS — I10 Essential (primary) hypertension: Secondary | ICD-10-CM | POA: Diagnosis not present

## 2021-07-04 DIAGNOSIS — I10 Essential (primary) hypertension: Secondary | ICD-10-CM | POA: Diagnosis not present

## 2021-07-04 DIAGNOSIS — E559 Vitamin D deficiency, unspecified: Secondary | ICD-10-CM | POA: Diagnosis not present

## 2021-07-04 DIAGNOSIS — E039 Hypothyroidism, unspecified: Secondary | ICD-10-CM | POA: Diagnosis not present

## 2021-07-04 DIAGNOSIS — R5381 Other malaise: Secondary | ICD-10-CM | POA: Diagnosis not present

## 2021-07-04 DIAGNOSIS — K5900 Constipation, unspecified: Secondary | ICD-10-CM | POA: Diagnosis not present

## 2021-07-04 DIAGNOSIS — E118 Type 2 diabetes mellitus with unspecified complications: Secondary | ICD-10-CM | POA: Diagnosis not present

## 2021-07-04 DIAGNOSIS — E785 Hyperlipidemia, unspecified: Secondary | ICD-10-CM | POA: Diagnosis not present

## 2021-07-04 DIAGNOSIS — G4733 Obstructive sleep apnea (adult) (pediatric): Secondary | ICD-10-CM | POA: Diagnosis not present

## 2021-07-18 DIAGNOSIS — G9340 Encephalopathy, unspecified: Secondary | ICD-10-CM | POA: Diagnosis not present

## 2021-07-18 DIAGNOSIS — M6281 Muscle weakness (generalized): Secondary | ICD-10-CM | POA: Diagnosis not present

## 2021-07-18 DIAGNOSIS — R2689 Other abnormalities of gait and mobility: Secondary | ICD-10-CM | POA: Diagnosis not present

## 2021-07-18 DIAGNOSIS — S88112D Complete traumatic amputation at level between knee and ankle, left lower leg, subsequent encounter: Secondary | ICD-10-CM | POA: Diagnosis not present

## 2021-07-31 DIAGNOSIS — G4733 Obstructive sleep apnea (adult) (pediatric): Secondary | ICD-10-CM | POA: Diagnosis not present

## 2021-07-31 DIAGNOSIS — E559 Vitamin D deficiency, unspecified: Secondary | ICD-10-CM | POA: Diagnosis not present

## 2021-07-31 DIAGNOSIS — R609 Edema, unspecified: Secondary | ICD-10-CM | POA: Diagnosis not present

## 2021-07-31 DIAGNOSIS — E039 Hypothyroidism, unspecified: Secondary | ICD-10-CM | POA: Diagnosis not present

## 2021-07-31 DIAGNOSIS — E785 Hyperlipidemia, unspecified: Secondary | ICD-10-CM | POA: Diagnosis not present

## 2021-07-31 DIAGNOSIS — R5381 Other malaise: Secondary | ICD-10-CM | POA: Diagnosis not present

## 2021-07-31 DIAGNOSIS — I10 Essential (primary) hypertension: Secondary | ICD-10-CM | POA: Diagnosis not present

## 2021-07-31 DIAGNOSIS — K5900 Constipation, unspecified: Secondary | ICD-10-CM | POA: Diagnosis not present

## 2021-07-31 DIAGNOSIS — E118 Type 2 diabetes mellitus with unspecified complications: Secondary | ICD-10-CM | POA: Diagnosis not present

## 2021-08-04 DIAGNOSIS — E559 Vitamin D deficiency, unspecified: Secondary | ICD-10-CM | POA: Diagnosis not present

## 2021-08-04 DIAGNOSIS — E785 Hyperlipidemia, unspecified: Secondary | ICD-10-CM | POA: Diagnosis not present

## 2021-08-04 DIAGNOSIS — E118 Type 2 diabetes mellitus with unspecified complications: Secondary | ICD-10-CM | POA: Diagnosis not present

## 2021-08-04 DIAGNOSIS — E039 Hypothyroidism, unspecified: Secondary | ICD-10-CM | POA: Diagnosis not present

## 2021-08-04 DIAGNOSIS — I10 Essential (primary) hypertension: Secondary | ICD-10-CM | POA: Diagnosis not present

## 2021-08-04 DIAGNOSIS — R609 Edema, unspecified: Secondary | ICD-10-CM | POA: Diagnosis not present

## 2021-08-04 DIAGNOSIS — K5900 Constipation, unspecified: Secondary | ICD-10-CM | POA: Diagnosis not present

## 2021-08-04 DIAGNOSIS — G9340 Encephalopathy, unspecified: Secondary | ICD-10-CM | POA: Diagnosis not present

## 2021-08-04 DIAGNOSIS — G4733 Obstructive sleep apnea (adult) (pediatric): Secondary | ICD-10-CM | POA: Diagnosis not present

## 2021-08-09 DIAGNOSIS — R300 Dysuria: Secondary | ICD-10-CM | POA: Diagnosis not present

## 2021-08-11 DIAGNOSIS — E039 Hypothyroidism, unspecified: Secondary | ICD-10-CM | POA: Diagnosis not present

## 2021-08-11 DIAGNOSIS — N390 Urinary tract infection, site not specified: Secondary | ICD-10-CM | POA: Diagnosis not present

## 2021-08-11 DIAGNOSIS — G4733 Obstructive sleep apnea (adult) (pediatric): Secondary | ICD-10-CM | POA: Diagnosis not present

## 2021-08-11 DIAGNOSIS — E559 Vitamin D deficiency, unspecified: Secondary | ICD-10-CM | POA: Diagnosis not present

## 2021-08-11 DIAGNOSIS — E118 Type 2 diabetes mellitus with unspecified complications: Secondary | ICD-10-CM | POA: Diagnosis not present

## 2021-08-11 DIAGNOSIS — K5900 Constipation, unspecified: Secondary | ICD-10-CM | POA: Diagnosis not present

## 2021-08-11 DIAGNOSIS — I129 Hypertensive chronic kidney disease with stage 1 through stage 4 chronic kidney disease, or unspecified chronic kidney disease: Secondary | ICD-10-CM | POA: Diagnosis not present

## 2021-08-11 DIAGNOSIS — R609 Edema, unspecified: Secondary | ICD-10-CM | POA: Diagnosis not present

## 2021-08-11 DIAGNOSIS — R5381 Other malaise: Secondary | ICD-10-CM | POA: Diagnosis not present

## 2021-08-28 DIAGNOSIS — N390 Urinary tract infection, site not specified: Secondary | ICD-10-CM | POA: Diagnosis not present

## 2021-08-28 DIAGNOSIS — G9340 Encephalopathy, unspecified: Secondary | ICD-10-CM | POA: Diagnosis not present

## 2021-08-28 DIAGNOSIS — K5900 Constipation, unspecified: Secondary | ICD-10-CM | POA: Diagnosis not present

## 2021-08-28 DIAGNOSIS — E118 Type 2 diabetes mellitus with unspecified complications: Secondary | ICD-10-CM | POA: Diagnosis not present

## 2021-08-28 DIAGNOSIS — I129 Hypertensive chronic kidney disease with stage 1 through stage 4 chronic kidney disease, or unspecified chronic kidney disease: Secondary | ICD-10-CM | POA: Diagnosis not present

## 2021-08-28 DIAGNOSIS — G4733 Obstructive sleep apnea (adult) (pediatric): Secondary | ICD-10-CM | POA: Diagnosis not present

## 2021-08-28 DIAGNOSIS — E785 Hyperlipidemia, unspecified: Secondary | ICD-10-CM | POA: Diagnosis not present

## 2021-09-08 DIAGNOSIS — R32 Unspecified urinary incontinence: Secondary | ICD-10-CM | POA: Diagnosis not present

## 2021-09-08 DIAGNOSIS — E785 Hyperlipidemia, unspecified: Secondary | ICD-10-CM | POA: Diagnosis not present

## 2021-09-08 DIAGNOSIS — E039 Hypothyroidism, unspecified: Secondary | ICD-10-CM | POA: Diagnosis not present

## 2021-09-08 DIAGNOSIS — R609 Edema, unspecified: Secondary | ICD-10-CM | POA: Diagnosis not present

## 2021-09-08 DIAGNOSIS — E559 Vitamin D deficiency, unspecified: Secondary | ICD-10-CM | POA: Diagnosis not present

## 2021-09-08 DIAGNOSIS — G4733 Obstructive sleep apnea (adult) (pediatric): Secondary | ICD-10-CM | POA: Diagnosis not present

## 2021-09-08 DIAGNOSIS — G9340 Encephalopathy, unspecified: Secondary | ICD-10-CM | POA: Diagnosis not present

## 2021-09-08 DIAGNOSIS — E118 Type 2 diabetes mellitus with unspecified complications: Secondary | ICD-10-CM | POA: Diagnosis not present

## 2021-09-17 DIAGNOSIS — E559 Vitamin D deficiency, unspecified: Secondary | ICD-10-CM | POA: Diagnosis not present

## 2021-09-17 DIAGNOSIS — E119 Type 2 diabetes mellitus without complications: Secondary | ICD-10-CM | POA: Diagnosis not present

## 2021-09-17 DIAGNOSIS — D531 Other megaloblastic anemias, not elsewhere classified: Secondary | ICD-10-CM | POA: Diagnosis not present

## 2021-09-18 DIAGNOSIS — Z79899 Other long term (current) drug therapy: Secondary | ICD-10-CM | POA: Diagnosis not present

## 2021-09-25 DIAGNOSIS — G4733 Obstructive sleep apnea (adult) (pediatric): Secondary | ICD-10-CM | POA: Diagnosis not present

## 2021-09-25 DIAGNOSIS — G9340 Encephalopathy, unspecified: Secondary | ICD-10-CM | POA: Diagnosis not present

## 2021-09-25 DIAGNOSIS — I10 Essential (primary) hypertension: Secondary | ICD-10-CM | POA: Diagnosis not present

## 2021-09-25 DIAGNOSIS — R609 Edema, unspecified: Secondary | ICD-10-CM | POA: Diagnosis not present

## 2021-09-25 DIAGNOSIS — E785 Hyperlipidemia, unspecified: Secondary | ICD-10-CM | POA: Diagnosis not present

## 2021-09-25 DIAGNOSIS — E559 Vitamin D deficiency, unspecified: Secondary | ICD-10-CM | POA: Diagnosis not present

## 2021-09-25 DIAGNOSIS — E118 Type 2 diabetes mellitus with unspecified complications: Secondary | ICD-10-CM | POA: Diagnosis not present

## 2021-09-25 DIAGNOSIS — F02A0 Dementia in other diseases classified elsewhere, mild, without behavioral disturbance, psychotic disturbance, mood disturbance, and anxiety: Secondary | ICD-10-CM | POA: Diagnosis not present

## 2021-10-06 DIAGNOSIS — G4733 Obstructive sleep apnea (adult) (pediatric): Secondary | ICD-10-CM | POA: Diagnosis not present

## 2021-10-06 DIAGNOSIS — K5900 Constipation, unspecified: Secondary | ICD-10-CM | POA: Diagnosis not present

## 2021-10-06 DIAGNOSIS — E785 Hyperlipidemia, unspecified: Secondary | ICD-10-CM | POA: Diagnosis not present

## 2021-10-06 DIAGNOSIS — R32 Unspecified urinary incontinence: Secondary | ICD-10-CM | POA: Diagnosis not present

## 2021-10-06 DIAGNOSIS — J302 Other seasonal allergic rhinitis: Secondary | ICD-10-CM | POA: Diagnosis not present

## 2021-10-06 DIAGNOSIS — E118 Type 2 diabetes mellitus with unspecified complications: Secondary | ICD-10-CM | POA: Diagnosis not present

## 2021-10-06 DIAGNOSIS — I129 Hypertensive chronic kidney disease with stage 1 through stage 4 chronic kidney disease, or unspecified chronic kidney disease: Secondary | ICD-10-CM | POA: Diagnosis not present

## 2021-10-21 ENCOUNTER — Ambulatory Visit: Payer: PPO | Admitting: Podiatry

## 2021-10-24 DIAGNOSIS — E785 Hyperlipidemia, unspecified: Secondary | ICD-10-CM | POA: Diagnosis not present

## 2021-10-24 DIAGNOSIS — E118 Type 2 diabetes mellitus with unspecified complications: Secondary | ICD-10-CM | POA: Diagnosis not present

## 2021-10-24 DIAGNOSIS — E039 Hypothyroidism, unspecified: Secondary | ICD-10-CM | POA: Diagnosis not present

## 2021-10-24 DIAGNOSIS — I10 Essential (primary) hypertension: Secondary | ICD-10-CM | POA: Diagnosis not present

## 2021-10-24 DIAGNOSIS — I129 Hypertensive chronic kidney disease with stage 1 through stage 4 chronic kidney disease, or unspecified chronic kidney disease: Secondary | ICD-10-CM | POA: Diagnosis not present

## 2021-10-24 DIAGNOSIS — E559 Vitamin D deficiency, unspecified: Secondary | ICD-10-CM | POA: Diagnosis not present

## 2021-10-30 DIAGNOSIS — E559 Vitamin D deficiency, unspecified: Secondary | ICD-10-CM | POA: Diagnosis not present

## 2021-10-30 DIAGNOSIS — E785 Hyperlipidemia, unspecified: Secondary | ICD-10-CM | POA: Diagnosis not present

## 2021-10-30 DIAGNOSIS — E118 Type 2 diabetes mellitus with unspecified complications: Secondary | ICD-10-CM | POA: Diagnosis not present

## 2021-10-30 DIAGNOSIS — J302 Other seasonal allergic rhinitis: Secondary | ICD-10-CM | POA: Diagnosis not present

## 2021-10-30 DIAGNOSIS — R32 Unspecified urinary incontinence: Secondary | ICD-10-CM | POA: Diagnosis not present

## 2021-10-30 DIAGNOSIS — R5381 Other malaise: Secondary | ICD-10-CM | POA: Diagnosis not present

## 2021-10-30 DIAGNOSIS — I10 Essential (primary) hypertension: Secondary | ICD-10-CM | POA: Diagnosis not present

## 2021-10-30 DIAGNOSIS — R609 Edema, unspecified: Secondary | ICD-10-CM | POA: Diagnosis not present

## 2021-10-30 DIAGNOSIS — G4733 Obstructive sleep apnea (adult) (pediatric): Secondary | ICD-10-CM | POA: Diagnosis not present

## 2021-10-30 DIAGNOSIS — K5900 Constipation, unspecified: Secondary | ICD-10-CM | POA: Diagnosis not present

## 2021-11-03 DIAGNOSIS — R609 Edema, unspecified: Secondary | ICD-10-CM | POA: Diagnosis not present

## 2021-11-03 DIAGNOSIS — U071 COVID-19: Secondary | ICD-10-CM | POA: Diagnosis not present

## 2021-11-03 DIAGNOSIS — G4733 Obstructive sleep apnea (adult) (pediatric): Secondary | ICD-10-CM | POA: Diagnosis not present

## 2021-11-03 DIAGNOSIS — J302 Other seasonal allergic rhinitis: Secondary | ICD-10-CM | POA: Diagnosis not present

## 2021-11-03 DIAGNOSIS — E559 Vitamin D deficiency, unspecified: Secondary | ICD-10-CM | POA: Diagnosis not present

## 2021-11-03 DIAGNOSIS — E118 Type 2 diabetes mellitus with unspecified complications: Secondary | ICD-10-CM | POA: Diagnosis not present

## 2021-11-03 DIAGNOSIS — I129 Hypertensive chronic kidney disease with stage 1 through stage 4 chronic kidney disease, or unspecified chronic kidney disease: Secondary | ICD-10-CM | POA: Diagnosis not present

## 2021-11-03 DIAGNOSIS — E785 Hyperlipidemia, unspecified: Secondary | ICD-10-CM | POA: Diagnosis not present

## 2021-11-03 DIAGNOSIS — F02A0 Dementia in other diseases classified elsewhere, mild, without behavioral disturbance, psychotic disturbance, mood disturbance, and anxiety: Secondary | ICD-10-CM | POA: Diagnosis not present

## 2021-11-03 DIAGNOSIS — R5381 Other malaise: Secondary | ICD-10-CM | POA: Diagnosis not present

## 2021-11-03 DIAGNOSIS — K5900 Constipation, unspecified: Secondary | ICD-10-CM | POA: Diagnosis not present

## 2021-11-26 ENCOUNTER — Encounter: Payer: Self-pay | Admitting: Podiatry

## 2021-11-26 ENCOUNTER — Ambulatory Visit (INDEPENDENT_AMBULATORY_CARE_PROVIDER_SITE_OTHER): Payer: PPO | Admitting: Podiatry

## 2021-11-26 DIAGNOSIS — L84 Corns and callosities: Secondary | ICD-10-CM | POA: Diagnosis not present

## 2021-11-26 DIAGNOSIS — B351 Tinea unguium: Secondary | ICD-10-CM | POA: Diagnosis not present

## 2021-11-26 DIAGNOSIS — E119 Type 2 diabetes mellitus without complications: Secondary | ICD-10-CM

## 2021-11-26 DIAGNOSIS — M79675 Pain in left toe(s): Secondary | ICD-10-CM | POA: Diagnosis not present

## 2021-11-26 DIAGNOSIS — E1142 Type 2 diabetes mellitus with diabetic polyneuropathy: Secondary | ICD-10-CM

## 2021-11-26 DIAGNOSIS — M79674 Pain in right toe(s): Secondary | ICD-10-CM | POA: Diagnosis not present

## 2021-11-26 NOTE — Progress Notes (Signed)
?  Subjective:  ?Patient ID: Katelyn [name], female    DOB: 05/04/1946,   MRN: [id number] ? ?No chief complaint on file. ? ? ?76 y.o. female presents concern of thickened elongated and painful nails that are difficult to trim.   Requesting to have them trimmed today. Denies any burning or tingling in her feet. Patient is diabetic and last A1c was 6.9. ?. Denies any other pedal complaints. Denies n/v/f/c.  ? ?PCP: Sande Brothers MD  ? ?Past Medical History:  ?Diagnosis Date  ? Allergic rhinitis   ? Asthma   ? Chronic pain syndrome   ? Colon polyp   ? Diabetes mellitus   ? H. pylori infection   ? Hyperlipidemia   ? Hyperparathyroidism ([place])   ? Hypertension   ? Hypertensive heart disease without heart failure   ? Morbid obesity ([place])   ? Sleep apnea   ? Vitamin D deficiency   ? ? ?Objective:  ?Physical Exam: ?Vascular: DP/PT pulses 2/4 bilateral. CFT <3 seconds. Absent hair growth on digits. Edema noted to bilateral lower extremities. Xerosis noted bilaterally.  ?Skin. No lacerations or abrasions bilateral feet. Nails 1-5 bilateral  are thickened discolored and elongated with subungual debris. ?Musculoskeletal: MMT 5/5 bilateral lower extremities in DF, PF, Inversion and Eversion. Deceased ROM in DF of ankle joint.  ?Neurological: Sensation intact to light touch. Protective sensation diminished bilateral.  ? ? ?Assessment:  ? ?1. Diabetes mellitus without complication ([place])   ?2. Diabetic polyneuropathy associated with type 2 diabetes mellitus ([place])   ?3. Pain due to onychomycosis of toenails of both feet   ?4. Corns and callosities   ? ? ? ?Plan:  ?Patient was evaluated and treated and all questions answered. ?-Discussed and educated patient on diabetic foot care, especially with  ?regards to the vascular, neurological and musculoskeletal systems.  ?-Stressed the importance of good glycemic control and the detriment of not  ?controlling glucose levels in relation to the foot. ?-Discussed supportive shoes at all times  and checking feet regularly.  ?-Mechanically debrided all nails 1-5 bilateral using sterile nail nipper and filed with dremel without incident  ?-Answered all patient questions ?-Patient to return  in 3 months for at risk foot care ?-Patient advised to call the office if any problems or questions arise in the meantime. ? ? ?Lorenda Peck, DPM  ? ? ?

## 2021-11-28 DIAGNOSIS — I129 Hypertensive chronic kidney disease with stage 1 through stage 4 chronic kidney disease, or unspecified chronic kidney disease: Secondary | ICD-10-CM | POA: Diagnosis not present

## 2021-11-28 DIAGNOSIS — E559 Vitamin D deficiency, unspecified: Secondary | ICD-10-CM | POA: Diagnosis not present

## 2021-11-28 DIAGNOSIS — E785 Hyperlipidemia, unspecified: Secondary | ICD-10-CM | POA: Diagnosis not present

## 2021-11-28 DIAGNOSIS — E039 Hypothyroidism, unspecified: Secondary | ICD-10-CM | POA: Diagnosis not present

## 2021-11-28 DIAGNOSIS — I10 Essential (primary) hypertension: Secondary | ICD-10-CM | POA: Diagnosis not present

## 2021-11-28 DIAGNOSIS — E118 Type 2 diabetes mellitus with unspecified complications: Secondary | ICD-10-CM | POA: Diagnosis not present

## 2021-12-01 DIAGNOSIS — K5900 Constipation, unspecified: Secondary | ICD-10-CM | POA: Diagnosis not present

## 2021-12-01 DIAGNOSIS — E039 Hypothyroidism, unspecified: Secondary | ICD-10-CM | POA: Diagnosis not present

## 2021-12-01 DIAGNOSIS — J302 Other seasonal allergic rhinitis: Secondary | ICD-10-CM | POA: Diagnosis not present

## 2021-12-01 DIAGNOSIS — E118 Type 2 diabetes mellitus with unspecified complications: Secondary | ICD-10-CM | POA: Diagnosis not present

## 2021-12-01 DIAGNOSIS — R5381 Other malaise: Secondary | ICD-10-CM | POA: Diagnosis not present

## 2021-12-01 DIAGNOSIS — R609 Edema, unspecified: Secondary | ICD-10-CM | POA: Diagnosis not present

## 2021-12-01 DIAGNOSIS — E559 Vitamin D deficiency, unspecified: Secondary | ICD-10-CM | POA: Diagnosis not present

## 2021-12-01 DIAGNOSIS — G4733 Obstructive sleep apnea (adult) (pediatric): Secondary | ICD-10-CM | POA: Diagnosis not present

## 2021-12-01 DIAGNOSIS — E785 Hyperlipidemia, unspecified: Secondary | ICD-10-CM | POA: Diagnosis not present

## 2021-12-01 DIAGNOSIS — I129 Hypertensive chronic kidney disease with stage 1 through stage 4 chronic kidney disease, or unspecified chronic kidney disease: Secondary | ICD-10-CM | POA: Diagnosis not present

## 2021-12-04 DIAGNOSIS — E039 Hypothyroidism, unspecified: Secondary | ICD-10-CM | POA: Diagnosis not present

## 2021-12-04 DIAGNOSIS — R5381 Other malaise: Secondary | ICD-10-CM | POA: Diagnosis not present

## 2021-12-04 DIAGNOSIS — E559 Vitamin D deficiency, unspecified: Secondary | ICD-10-CM | POA: Diagnosis not present

## 2021-12-04 DIAGNOSIS — J302 Other seasonal allergic rhinitis: Secondary | ICD-10-CM | POA: Diagnosis not present

## 2021-12-04 DIAGNOSIS — R609 Edema, unspecified: Secondary | ICD-10-CM | POA: Diagnosis not present

## 2021-12-04 DIAGNOSIS — E785 Hyperlipidemia, unspecified: Secondary | ICD-10-CM | POA: Diagnosis not present

## 2021-12-04 DIAGNOSIS — E118 Type 2 diabetes mellitus with unspecified complications: Secondary | ICD-10-CM | POA: Diagnosis not present

## 2021-12-04 DIAGNOSIS — G4733 Obstructive sleep apnea (adult) (pediatric): Secondary | ICD-10-CM | POA: Diagnosis not present

## 2021-12-04 DIAGNOSIS — I129 Hypertensive chronic kidney disease with stage 1 through stage 4 chronic kidney disease, or unspecified chronic kidney disease: Secondary | ICD-10-CM | POA: Diagnosis not present

## 2021-12-04 DIAGNOSIS — K5900 Constipation, unspecified: Secondary | ICD-10-CM | POA: Diagnosis not present

## 2021-12-11 DIAGNOSIS — R2242 Localized swelling, mass and lump, left lower limb: Secondary | ICD-10-CM | POA: Diagnosis not present

## 2021-12-16 DIAGNOSIS — E785 Hyperlipidemia, unspecified: Secondary | ICD-10-CM | POA: Diagnosis not present

## 2021-12-16 DIAGNOSIS — F02A0 Dementia in other diseases classified elsewhere, mild, without behavioral disturbance, psychotic disturbance, mood disturbance, and anxiety: Secondary | ICD-10-CM | POA: Diagnosis not present

## 2021-12-16 DIAGNOSIS — I129 Hypertensive chronic kidney disease with stage 1 through stage 4 chronic kidney disease, or unspecified chronic kidney disease: Secondary | ICD-10-CM | POA: Diagnosis not present

## 2021-12-16 DIAGNOSIS — E039 Hypothyroidism, unspecified: Secondary | ICD-10-CM | POA: Diagnosis not present

## 2021-12-16 DIAGNOSIS — I10 Essential (primary) hypertension: Secondary | ICD-10-CM | POA: Diagnosis not present

## 2021-12-16 DIAGNOSIS — E559 Vitamin D deficiency, unspecified: Secondary | ICD-10-CM | POA: Diagnosis not present

## 2021-12-16 DIAGNOSIS — E118 Type 2 diabetes mellitus with unspecified complications: Secondary | ICD-10-CM | POA: Diagnosis not present

## 2021-12-22 DIAGNOSIS — E119 Type 2 diabetes mellitus without complications: Secondary | ICD-10-CM | POA: Diagnosis not present

## 2021-12-22 DIAGNOSIS — I10 Essential (primary) hypertension: Secondary | ICD-10-CM | POA: Diagnosis not present

## 2021-12-24 DIAGNOSIS — E559 Vitamin D deficiency, unspecified: Secondary | ICD-10-CM | POA: Diagnosis not present

## 2021-12-24 DIAGNOSIS — I10 Essential (primary) hypertension: Secondary | ICD-10-CM | POA: Diagnosis not present

## 2021-12-24 DIAGNOSIS — E119 Type 2 diabetes mellitus without complications: Secondary | ICD-10-CM | POA: Diagnosis not present

## 2021-12-29 DIAGNOSIS — R609 Edema, unspecified: Secondary | ICD-10-CM | POA: Diagnosis not present

## 2021-12-29 DIAGNOSIS — K5900 Constipation, unspecified: Secondary | ICD-10-CM | POA: Diagnosis not present

## 2021-12-29 DIAGNOSIS — J302 Other seasonal allergic rhinitis: Secondary | ICD-10-CM | POA: Diagnosis not present

## 2021-12-29 DIAGNOSIS — E118 Type 2 diabetes mellitus with unspecified complications: Secondary | ICD-10-CM | POA: Diagnosis not present

## 2021-12-29 DIAGNOSIS — I129 Hypertensive chronic kidney disease with stage 1 through stage 4 chronic kidney disease, or unspecified chronic kidney disease: Secondary | ICD-10-CM | POA: Diagnosis not present

## 2021-12-29 DIAGNOSIS — E785 Hyperlipidemia, unspecified: Secondary | ICD-10-CM | POA: Diagnosis not present

## 2022-01-20 DIAGNOSIS — M21612 Bunion of left foot: Secondary | ICD-10-CM | POA: Diagnosis not present

## 2022-01-20 DIAGNOSIS — E1151 Type 2 diabetes mellitus with diabetic peripheral angiopathy without gangrene: Secondary | ICD-10-CM | POA: Diagnosis not present

## 2022-01-20 DIAGNOSIS — L603 Nail dystrophy: Secondary | ICD-10-CM | POA: Diagnosis not present

## 2022-01-20 DIAGNOSIS — I739 Peripheral vascular disease, unspecified: Secondary | ICD-10-CM | POA: Diagnosis not present

## 2022-01-20 DIAGNOSIS — M21611 Bunion of right foot: Secondary | ICD-10-CM | POA: Diagnosis not present

## 2022-01-22 DIAGNOSIS — G4733 Obstructive sleep apnea (adult) (pediatric): Secondary | ICD-10-CM | POA: Diagnosis not present

## 2022-01-22 DIAGNOSIS — F02A0 Dementia in other diseases classified elsewhere, mild, without behavioral disturbance, psychotic disturbance, mood disturbance, and anxiety: Secondary | ICD-10-CM | POA: Diagnosis not present

## 2022-01-22 DIAGNOSIS — E785 Hyperlipidemia, unspecified: Secondary | ICD-10-CM | POA: Diagnosis not present

## 2022-01-22 DIAGNOSIS — R609 Edema, unspecified: Secondary | ICD-10-CM | POA: Diagnosis not present

## 2022-01-22 DIAGNOSIS — I129 Hypertensive chronic kidney disease with stage 1 through stage 4 chronic kidney disease, or unspecified chronic kidney disease: Secondary | ICD-10-CM | POA: Diagnosis not present

## 2022-01-22 DIAGNOSIS — K5900 Constipation, unspecified: Secondary | ICD-10-CM | POA: Diagnosis not present

## 2022-01-22 DIAGNOSIS — E118 Type 2 diabetes mellitus with unspecified complications: Secondary | ICD-10-CM | POA: Diagnosis not present

## 2022-01-23 DIAGNOSIS — E559 Vitamin D deficiency, unspecified: Secondary | ICD-10-CM | POA: Diagnosis not present

## 2022-01-23 DIAGNOSIS — I129 Hypertensive chronic kidney disease with stage 1 through stage 4 chronic kidney disease, or unspecified chronic kidney disease: Secondary | ICD-10-CM | POA: Diagnosis not present

## 2022-01-23 DIAGNOSIS — E785 Hyperlipidemia, unspecified: Secondary | ICD-10-CM | POA: Diagnosis not present

## 2022-01-23 DIAGNOSIS — E039 Hypothyroidism, unspecified: Secondary | ICD-10-CM | POA: Diagnosis not present

## 2022-01-23 DIAGNOSIS — E118 Type 2 diabetes mellitus with unspecified complications: Secondary | ICD-10-CM | POA: Diagnosis not present

## 2022-01-23 DIAGNOSIS — I10 Essential (primary) hypertension: Secondary | ICD-10-CM | POA: Diagnosis not present

## 2022-01-27 DIAGNOSIS — Z79899 Other long term (current) drug therapy: Secondary | ICD-10-CM | POA: Diagnosis not present

## 2022-01-27 DIAGNOSIS — I10 Essential (primary) hypertension: Secondary | ICD-10-CM | POA: Diagnosis not present

## 2022-01-27 DIAGNOSIS — F02A0 Dementia in other diseases classified elsewhere, mild, without behavioral disturbance, psychotic disturbance, mood disturbance, and anxiety: Secondary | ICD-10-CM | POA: Diagnosis not present

## 2022-01-27 DIAGNOSIS — E118 Type 2 diabetes mellitus with unspecified complications: Secondary | ICD-10-CM | POA: Diagnosis not present

## 2022-02-19 DIAGNOSIS — Z79899 Other long term (current) drug therapy: Secondary | ICD-10-CM | POA: Diagnosis not present

## 2022-02-19 DIAGNOSIS — E118 Type 2 diabetes mellitus with unspecified complications: Secondary | ICD-10-CM | POA: Diagnosis not present

## 2022-02-19 DIAGNOSIS — I10 Essential (primary) hypertension: Secondary | ICD-10-CM | POA: Diagnosis not present

## 2022-02-19 DIAGNOSIS — E785 Hyperlipidemia, unspecified: Secondary | ICD-10-CM | POA: Diagnosis not present

## 2022-02-24 DIAGNOSIS — E785 Hyperlipidemia, unspecified: Secondary | ICD-10-CM | POA: Diagnosis not present

## 2022-02-24 DIAGNOSIS — E118 Type 2 diabetes mellitus with unspecified complications: Secondary | ICD-10-CM | POA: Diagnosis not present

## 2022-02-24 DIAGNOSIS — I10 Essential (primary) hypertension: Secondary | ICD-10-CM | POA: Diagnosis not present

## 2022-02-24 DIAGNOSIS — F02A0 Dementia in other diseases classified elsewhere, mild, without behavioral disturbance, psychotic disturbance, mood disturbance, and anxiety: Secondary | ICD-10-CM | POA: Diagnosis not present

## 2022-02-25 ENCOUNTER — Ambulatory Visit (INDEPENDENT_AMBULATORY_CARE_PROVIDER_SITE_OTHER): Payer: PPO | Admitting: Podiatry

## 2022-02-25 ENCOUNTER — Encounter: Payer: Self-pay | Admitting: Podiatry

## 2022-02-25 DIAGNOSIS — I10 Essential (primary) hypertension: Secondary | ICD-10-CM | POA: Diagnosis not present

## 2022-02-25 DIAGNOSIS — B351 Tinea unguium: Secondary | ICD-10-CM | POA: Diagnosis not present

## 2022-02-25 DIAGNOSIS — E1142 Type 2 diabetes mellitus with diabetic polyneuropathy: Secondary | ICD-10-CM

## 2022-02-25 DIAGNOSIS — E119 Type 2 diabetes mellitus without complications: Secondary | ICD-10-CM

## 2022-02-25 DIAGNOSIS — M79675 Pain in left toe(s): Secondary | ICD-10-CM

## 2022-02-25 DIAGNOSIS — M79674 Pain in right toe(s): Secondary | ICD-10-CM | POA: Diagnosis not present

## 2022-02-25 NOTE — Progress Notes (Signed)
  Subjective:  Patient ID: Katelyn [name], female    DOB: [birth year],   MRN: [id number]  No chief complaint on file.   76 y.o. female presents concern of thickened elongated and painful nails that are difficult to trim.   Requesting to have them trimmed today. Denies any burning or tingling in her feet. Patient is diabetic and last A1c was 6.9. . Denies any other pedal complaints. Denies n/v/f/c.   PCP: Sande Brothers MD   Past Medical History:  Diagnosis Date   Allergic rhinitis    Asthma    Chronic pain syndrome    Colon polyp    Diabetes mellitus    H. pylori infection    Hyperlipidemia    Hyperparathyroidism ([place])    Hypertension    Hypertensive heart disease without heart failure    Morbid obesity ([place])    Sleep apnea    Vitamin D deficiency     Objective:  Physical Exam: Vascular: DP/PT pulses 2/4 bilateral. CFT <3 seconds. Absent hair growth on digits. Edema noted to bilateral lower extremities. Xerosis noted bilaterally.  Skin. No lacerations or abrasions bilateral feet. Nails 1-5 bilateral  are thickened discolored and elongated with subungual debris. Musculoskeletal: MMT 5/5 bilateral lower extremities in DF, PF, Inversion and Eversion. Deceased ROM in DF of ankle joint.  Neurological: Sensation intact to light touch. Protective sensation diminished bilateral.    Assessment:   1. Pain due to onychomycosis of toenails of both feet   2. Diabetic polyneuropathy associated with type 2 diabetes mellitus ([place])   3. Diabetes mellitus without complication ([place])      Plan:  Patient was evaluated and treated and all questions answered. -Discussed and educated patient on diabetic foot care, especially with  regards to the vascular, neurological and musculoskeletal systems.  -Stressed the importance of good glycemic control and the detriment of not  controlling glucose levels in relation to the foot. -Discussed supportive shoes at all times and checking feet regularly.   -Mechanically debrided all nails 1-5 bilateral using sterile nail nipper and filed with dremel without incident  -Answered all patient questions -Patient to return  in 3 months for at risk foot care -Patient advised to call the office if any problems or questions arise in the meantime.   Lorenda Peck, DPM

## 2022-02-27 DIAGNOSIS — I10 Essential (primary) hypertension: Secondary | ICD-10-CM | POA: Diagnosis not present

## 2022-02-27 DIAGNOSIS — E039 Hypothyroidism, unspecified: Secondary | ICD-10-CM | POA: Diagnosis not present

## 2022-02-27 DIAGNOSIS — E559 Vitamin D deficiency, unspecified: Secondary | ICD-10-CM | POA: Diagnosis not present

## 2022-02-27 DIAGNOSIS — E118 Type 2 diabetes mellitus with unspecified complications: Secondary | ICD-10-CM | POA: Diagnosis not present

## 2022-02-27 DIAGNOSIS — I129 Hypertensive chronic kidney disease with stage 1 through stage 4 chronic kidney disease, or unspecified chronic kidney disease: Secondary | ICD-10-CM | POA: Diagnosis not present

## 2022-02-27 DIAGNOSIS — E785 Hyperlipidemia, unspecified: Secondary | ICD-10-CM | POA: Diagnosis not present

## 2022-03-19 DIAGNOSIS — E119 Type 2 diabetes mellitus without complications: Secondary | ICD-10-CM | POA: Diagnosis not present

## 2022-03-19 DIAGNOSIS — E559 Vitamin D deficiency, unspecified: Secondary | ICD-10-CM | POA: Diagnosis not present

## 2022-03-23 DIAGNOSIS — E118 Type 2 diabetes mellitus with unspecified complications: Secondary | ICD-10-CM | POA: Diagnosis not present

## 2022-03-23 DIAGNOSIS — E785 Hyperlipidemia, unspecified: Secondary | ICD-10-CM | POA: Diagnosis not present

## 2022-03-23 DIAGNOSIS — I10 Essential (primary) hypertension: Secondary | ICD-10-CM | POA: Diagnosis not present

## 2022-03-23 DIAGNOSIS — F02A0 Dementia in other diseases classified elsewhere, mild, without behavioral disturbance, psychotic disturbance, mood disturbance, and anxiety: Secondary | ICD-10-CM | POA: Diagnosis not present

## 2022-03-27 DIAGNOSIS — B351 Tinea unguium: Secondary | ICD-10-CM | POA: Diagnosis not present

## 2022-03-27 DIAGNOSIS — E1151 Type 2 diabetes mellitus with diabetic peripheral angiopathy without gangrene: Secondary | ICD-10-CM | POA: Diagnosis not present

## 2022-03-30 DIAGNOSIS — E559 Vitamin D deficiency, unspecified: Secondary | ICD-10-CM | POA: Diagnosis not present

## 2022-03-30 DIAGNOSIS — E118 Type 2 diabetes mellitus with unspecified complications: Secondary | ICD-10-CM | POA: Diagnosis not present

## 2022-03-30 DIAGNOSIS — E213 Hyperparathyroidism, unspecified: Secondary | ICD-10-CM | POA: Diagnosis not present

## 2022-03-30 DIAGNOSIS — F0390 Unspecified dementia without behavioral disturbance: Secondary | ICD-10-CM | POA: Diagnosis not present

## 2022-03-30 DIAGNOSIS — I10 Essential (primary) hypertension: Secondary | ICD-10-CM | POA: Diagnosis not present

## 2022-03-30 DIAGNOSIS — R5381 Other malaise: Secondary | ICD-10-CM | POA: Diagnosis not present

## 2022-03-30 DIAGNOSIS — G4733 Obstructive sleep apnea (adult) (pediatric): Secondary | ICD-10-CM | POA: Diagnosis not present

## 2022-03-30 DIAGNOSIS — E039 Hypothyroidism, unspecified: Secondary | ICD-10-CM | POA: Diagnosis not present

## 2022-03-30 DIAGNOSIS — I129 Hypertensive chronic kidney disease with stage 1 through stage 4 chronic kidney disease, or unspecified chronic kidney disease: Secondary | ICD-10-CM | POA: Diagnosis not present

## 2022-03-30 DIAGNOSIS — E785 Hyperlipidemia, unspecified: Secondary | ICD-10-CM | POA: Diagnosis not present

## 2022-04-08 ENCOUNTER — Encounter: Payer: Self-pay | Admitting: Podiatry

## 2022-04-25 DIAGNOSIS — I129 Hypertensive chronic kidney disease with stage 1 through stage 4 chronic kidney disease, or unspecified chronic kidney disease: Secondary | ICD-10-CM | POA: Diagnosis not present

## 2022-04-25 DIAGNOSIS — E785 Hyperlipidemia, unspecified: Secondary | ICD-10-CM | POA: Diagnosis not present

## 2022-04-25 DIAGNOSIS — E039 Hypothyroidism, unspecified: Secondary | ICD-10-CM | POA: Diagnosis not present

## 2022-04-25 DIAGNOSIS — E559 Vitamin D deficiency, unspecified: Secondary | ICD-10-CM | POA: Diagnosis not present

## 2022-04-25 DIAGNOSIS — I10 Essential (primary) hypertension: Secondary | ICD-10-CM | POA: Diagnosis not present

## 2022-04-25 DIAGNOSIS — E118 Type 2 diabetes mellitus with unspecified complications: Secondary | ICD-10-CM | POA: Diagnosis not present

## 2022-05-07 DIAGNOSIS — I10 Essential (primary) hypertension: Secondary | ICD-10-CM | POA: Diagnosis not present

## 2022-05-07 DIAGNOSIS — G4733 Obstructive sleep apnea (adult) (pediatric): Secondary | ICD-10-CM | POA: Diagnosis not present

## 2022-05-07 DIAGNOSIS — E785 Hyperlipidemia, unspecified: Secondary | ICD-10-CM | POA: Diagnosis not present

## 2022-05-07 DIAGNOSIS — F0390 Unspecified dementia without behavioral disturbance: Secondary | ICD-10-CM | POA: Diagnosis not present

## 2022-05-07 DIAGNOSIS — R5381 Other malaise: Secondary | ICD-10-CM | POA: Diagnosis not present

## 2022-05-07 DIAGNOSIS — E118 Type 2 diabetes mellitus with unspecified complications: Secondary | ICD-10-CM | POA: Diagnosis not present

## 2022-05-08 DIAGNOSIS — F0390 Unspecified dementia without behavioral disturbance: Secondary | ICD-10-CM | POA: Diagnosis not present

## 2022-05-08 DIAGNOSIS — G4733 Obstructive sleep apnea (adult) (pediatric): Secondary | ICD-10-CM | POA: Diagnosis not present

## 2022-05-08 DIAGNOSIS — I129 Hypertensive chronic kidney disease with stage 1 through stage 4 chronic kidney disease, or unspecified chronic kidney disease: Secondary | ICD-10-CM | POA: Diagnosis not present

## 2022-05-08 DIAGNOSIS — J302 Other seasonal allergic rhinitis: Secondary | ICD-10-CM | POA: Diagnosis not present

## 2022-05-08 DIAGNOSIS — E559 Vitamin D deficiency, unspecified: Secondary | ICD-10-CM | POA: Diagnosis not present

## 2022-05-08 DIAGNOSIS — E785 Hyperlipidemia, unspecified: Secondary | ICD-10-CM | POA: Diagnosis not present

## 2022-05-08 DIAGNOSIS — E213 Hyperparathyroidism, unspecified: Secondary | ICD-10-CM | POA: Diagnosis not present

## 2022-05-08 DIAGNOSIS — E118 Type 2 diabetes mellitus with unspecified complications: Secondary | ICD-10-CM | POA: Diagnosis not present

## 2022-05-08 DIAGNOSIS — I10 Essential (primary) hypertension: Secondary | ICD-10-CM | POA: Diagnosis not present

## 2022-05-27 ENCOUNTER — Ambulatory Visit: Payer: PPO | Admitting: Podiatry

## 2022-05-29 DIAGNOSIS — E039 Hypothyroidism, unspecified: Secondary | ICD-10-CM | POA: Diagnosis not present

## 2022-05-29 DIAGNOSIS — E559 Vitamin D deficiency, unspecified: Secondary | ICD-10-CM | POA: Diagnosis not present

## 2022-05-29 DIAGNOSIS — E118 Type 2 diabetes mellitus with unspecified complications: Secondary | ICD-10-CM | POA: Diagnosis not present

## 2022-05-29 DIAGNOSIS — I129 Hypertensive chronic kidney disease with stage 1 through stage 4 chronic kidney disease, or unspecified chronic kidney disease: Secondary | ICD-10-CM | POA: Diagnosis not present

## 2022-05-29 DIAGNOSIS — I10 Essential (primary) hypertension: Secondary | ICD-10-CM | POA: Diagnosis not present

## 2022-05-29 DIAGNOSIS — E785 Hyperlipidemia, unspecified: Secondary | ICD-10-CM | POA: Diagnosis not present

## 2022-06-08 DIAGNOSIS — F02A0 Dementia in other diseases classified elsewhere, mild, without behavioral disturbance, psychotic disturbance, mood disturbance, and anxiety: Secondary | ICD-10-CM | POA: Diagnosis not present

## 2022-06-08 DIAGNOSIS — M6281 Muscle weakness (generalized): Secondary | ICD-10-CM | POA: Diagnosis not present

## 2022-06-08 DIAGNOSIS — E118 Type 2 diabetes mellitus with unspecified complications: Secondary | ICD-10-CM | POA: Diagnosis not present

## 2022-06-15 DIAGNOSIS — R2689 Other abnormalities of gait and mobility: Secondary | ICD-10-CM | POA: Diagnosis not present

## 2022-06-15 DIAGNOSIS — F03A0 Unspecified dementia, mild, without behavioral disturbance, psychotic disturbance, mood disturbance, and anxiety: Secondary | ICD-10-CM | POA: Diagnosis not present

## 2022-06-15 DIAGNOSIS — M6281 Muscle weakness (generalized): Secondary | ICD-10-CM | POA: Diagnosis not present

## 2022-06-17 DIAGNOSIS — E785 Hyperlipidemia, unspecified: Secondary | ICD-10-CM | POA: Diagnosis not present

## 2022-06-17 DIAGNOSIS — I10 Essential (primary) hypertension: Secondary | ICD-10-CM | POA: Diagnosis not present

## 2022-06-17 DIAGNOSIS — G309 Alzheimer's disease, unspecified: Secondary | ICD-10-CM | POA: Diagnosis not present

## 2022-06-17 DIAGNOSIS — M109 Gout, unspecified: Secondary | ICD-10-CM | POA: Diagnosis not present

## 2022-06-17 DIAGNOSIS — F03A0 Unspecified dementia, mild, without behavioral disturbance, psychotic disturbance, mood disturbance, and anxiety: Secondary | ICD-10-CM | POA: Diagnosis not present

## 2022-06-17 DIAGNOSIS — E119 Type 2 diabetes mellitus without complications: Secondary | ICD-10-CM | POA: Diagnosis not present

## 2022-06-24 DIAGNOSIS — Z0189 Encounter for other specified special examinations: Secondary | ICD-10-CM | POA: Diagnosis not present

## 2022-06-24 DIAGNOSIS — E559 Vitamin D deficiency, unspecified: Secondary | ICD-10-CM | POA: Diagnosis not present

## 2022-06-29 DIAGNOSIS — F3342 Major depressive disorder, recurrent, in full remission: Secondary | ICD-10-CM | POA: Diagnosis not present

## 2022-07-03 DIAGNOSIS — F4320 Adjustment disorder, unspecified: Secondary | ICD-10-CM | POA: Diagnosis not present

## 2022-07-09 DIAGNOSIS — R110 Nausea: Secondary | ICD-10-CM | POA: Diagnosis not present

## 2022-07-12 DIAGNOSIS — F0390 Unspecified dementia without behavioral disturbance: Secondary | ICD-10-CM | POA: Diagnosis not present

## 2022-07-12 DIAGNOSIS — E119 Type 2 diabetes mellitus without complications: Secondary | ICD-10-CM | POA: Diagnosis not present

## 2022-07-12 DIAGNOSIS — D539 Nutritional anemia, unspecified: Secondary | ICD-10-CM | POA: Diagnosis not present

## 2022-07-12 DIAGNOSIS — M1990 Unspecified osteoarthritis, unspecified site: Secondary | ICD-10-CM | POA: Diagnosis not present

## 2022-07-12 DIAGNOSIS — N1832 Chronic kidney disease, stage 3b: Secondary | ICD-10-CM | POA: Diagnosis not present

## 2022-07-12 DIAGNOSIS — K219 Gastro-esophageal reflux disease without esophagitis: Secondary | ICD-10-CM | POA: Diagnosis not present

## 2022-07-12 DIAGNOSIS — E785 Hyperlipidemia, unspecified: Secondary | ICD-10-CM | POA: Diagnosis not present

## 2022-07-13 DIAGNOSIS — I10 Essential (primary) hypertension: Secondary | ICD-10-CM | POA: Diagnosis not present

## 2022-07-13 DIAGNOSIS — E119 Type 2 diabetes mellitus without complications: Secondary | ICD-10-CM | POA: Diagnosis not present

## 2022-07-18 DIAGNOSIS — E785 Hyperlipidemia, unspecified: Secondary | ICD-10-CM | POA: Diagnosis not present

## 2022-07-18 DIAGNOSIS — I10 Essential (primary) hypertension: Secondary | ICD-10-CM | POA: Diagnosis not present

## 2022-07-18 DIAGNOSIS — E538 Deficiency of other specified B group vitamins: Secondary | ICD-10-CM | POA: Diagnosis not present

## 2022-07-18 DIAGNOSIS — E119 Type 2 diabetes mellitus without complications: Secondary | ICD-10-CM | POA: Diagnosis not present

## 2022-08-05 DIAGNOSIS — I10 Essential (primary) hypertension: Secondary | ICD-10-CM | POA: Diagnosis not present

## 2022-08-05 DIAGNOSIS — E1140 Type 2 diabetes mellitus with diabetic neuropathy, unspecified: Secondary | ICD-10-CM | POA: Diagnosis not present

## 2022-08-28 DIAGNOSIS — J309 Allergic rhinitis, unspecified: Secondary | ICD-10-CM | POA: Diagnosis not present

## 2022-08-28 DIAGNOSIS — R053 Chronic cough: Secondary | ICD-10-CM | POA: Diagnosis not present

## 2022-08-28 DIAGNOSIS — R0981 Nasal congestion: Secondary | ICD-10-CM | POA: Diagnosis not present

## 2022-09-04 DIAGNOSIS — E1143 Type 2 diabetes mellitus with diabetic autonomic (poly)neuropathy: Secondary | ICD-10-CM | POA: Diagnosis not present

## 2022-09-04 DIAGNOSIS — I10 Essential (primary) hypertension: Secondary | ICD-10-CM | POA: Diagnosis not present

## 2022-09-04 DIAGNOSIS — E538 Deficiency of other specified B group vitamins: Secondary | ICD-10-CM | POA: Diagnosis not present

## 2022-09-04 DIAGNOSIS — F03A0 Unspecified dementia, mild, without behavioral disturbance, psychotic disturbance, mood disturbance, and anxiety: Secondary | ICD-10-CM | POA: Diagnosis not present

## 2022-09-04 DIAGNOSIS — E785 Hyperlipidemia, unspecified: Secondary | ICD-10-CM | POA: Diagnosis not present

## 2022-09-14 DIAGNOSIS — F4320 Adjustment disorder, unspecified: Secondary | ICD-10-CM | POA: Diagnosis not present

## 2022-09-22 DIAGNOSIS — I10 Essential (primary) hypertension: Secondary | ICD-10-CM | POA: Diagnosis not present

## 2022-09-22 DIAGNOSIS — E785 Hyperlipidemia, unspecified: Secondary | ICD-10-CM | POA: Diagnosis not present

## 2022-09-22 DIAGNOSIS — E1140 Type 2 diabetes mellitus with diabetic neuropathy, unspecified: Secondary | ICD-10-CM | POA: Diagnosis not present

## 2022-09-24 DIAGNOSIS — F03A0 Unspecified dementia, mild, without behavioral disturbance, psychotic disturbance, mood disturbance, and anxiety: Secondary | ICD-10-CM | POA: Diagnosis not present

## 2022-10-05 DIAGNOSIS — E1140 Type 2 diabetes mellitus with diabetic neuropathy, unspecified: Secondary | ICD-10-CM | POA: Diagnosis not present

## 2022-10-05 DIAGNOSIS — I10 Essential (primary) hypertension: Secondary | ICD-10-CM | POA: Diagnosis not present

## 2022-10-23 DIAGNOSIS — I10 Essential (primary) hypertension: Secondary | ICD-10-CM | POA: Diagnosis not present

## 2022-10-23 DIAGNOSIS — E785 Hyperlipidemia, unspecified: Secondary | ICD-10-CM | POA: Diagnosis not present

## 2022-10-23 DIAGNOSIS — E1140 Type 2 diabetes mellitus with diabetic neuropathy, unspecified: Secondary | ICD-10-CM | POA: Diagnosis not present

## 2022-10-23 DIAGNOSIS — K219 Gastro-esophageal reflux disease without esophagitis: Secondary | ICD-10-CM | POA: Diagnosis not present

## 2022-10-29 DIAGNOSIS — F03A0 Unspecified dementia, mild, without behavioral disturbance, psychotic disturbance, mood disturbance, and anxiety: Secondary | ICD-10-CM | POA: Diagnosis not present

## 2022-11-03 DIAGNOSIS — I10 Essential (primary) hypertension: Secondary | ICD-10-CM | POA: Diagnosis not present

## 2022-11-03 DIAGNOSIS — E1140 Type 2 diabetes mellitus with diabetic neuropathy, unspecified: Secondary | ICD-10-CM | POA: Diagnosis not present

## 2022-11-05 DIAGNOSIS — E785 Hyperlipidemia, unspecified: Secondary | ICD-10-CM | POA: Diagnosis not present

## 2022-11-05 DIAGNOSIS — I10 Essential (primary) hypertension: Secondary | ICD-10-CM | POA: Diagnosis not present

## 2022-11-05 DIAGNOSIS — E538 Deficiency of other specified B group vitamins: Secondary | ICD-10-CM | POA: Diagnosis not present

## 2022-11-05 DIAGNOSIS — E1143 Type 2 diabetes mellitus with diabetic autonomic (poly)neuropathy: Secondary | ICD-10-CM | POA: Diagnosis not present

## 2022-11-05 DIAGNOSIS — F03A0 Unspecified dementia, mild, without behavioral disturbance, psychotic disturbance, mood disturbance, and anxiety: Secondary | ICD-10-CM | POA: Diagnosis not present

## 2022-11-09 DIAGNOSIS — E161 Other hypoglycemia: Secondary | ICD-10-CM | POA: Diagnosis not present

## 2022-11-09 DIAGNOSIS — I10 Essential (primary) hypertension: Secondary | ICD-10-CM | POA: Diagnosis not present

## 2022-11-10 DIAGNOSIS — I10 Essential (primary) hypertension: Secondary | ICD-10-CM | POA: Diagnosis not present

## 2022-11-10 DIAGNOSIS — E1143 Type 2 diabetes mellitus with diabetic autonomic (poly)neuropathy: Secondary | ICD-10-CM | POA: Diagnosis not present

## 2022-11-16 DIAGNOSIS — K5900 Constipation, unspecified: Secondary | ICD-10-CM | POA: Diagnosis not present

## 2022-11-17 DIAGNOSIS — A049 Bacterial intestinal infection, unspecified: Secondary | ICD-10-CM | POA: Diagnosis not present

## 2022-11-19 DIAGNOSIS — K219 Gastro-esophageal reflux disease without esophagitis: Secondary | ICD-10-CM | POA: Diagnosis not present

## 2022-11-19 DIAGNOSIS — K3184 Gastroparesis: Secondary | ICD-10-CM | POA: Diagnosis not present

## 2022-11-19 DIAGNOSIS — E1151 Type 2 diabetes mellitus with diabetic peripheral angiopathy without gangrene: Secondary | ICD-10-CM | POA: Diagnosis not present

## 2022-11-19 DIAGNOSIS — L603 Nail dystrophy: Secondary | ICD-10-CM | POA: Diagnosis not present

## 2022-11-19 DIAGNOSIS — E1165 Type 2 diabetes mellitus with hyperglycemia: Secondary | ICD-10-CM | POA: Diagnosis not present

## 2022-11-19 DIAGNOSIS — I10 Essential (primary) hypertension: Secondary | ICD-10-CM | POA: Diagnosis not present

## 2022-11-19 DIAGNOSIS — E785 Hyperlipidemia, unspecified: Secondary | ICD-10-CM | POA: Diagnosis not present

## 2022-12-01 DIAGNOSIS — E1165 Type 2 diabetes mellitus with hyperglycemia: Secondary | ICD-10-CM | POA: Diagnosis not present

## 2022-12-01 DIAGNOSIS — M2042 Other hammer toe(s) (acquired), left foot: Secondary | ICD-10-CM | POA: Diagnosis not present

## 2022-12-01 DIAGNOSIS — M2011 Hallux valgus (acquired), right foot: Secondary | ICD-10-CM | POA: Diagnosis not present

## 2022-12-01 DIAGNOSIS — M2041 Other hammer toe(s) (acquired), right foot: Secondary | ICD-10-CM | POA: Diagnosis not present

## 2022-12-01 DIAGNOSIS — M2012 Hallux valgus (acquired), left foot: Secondary | ICD-10-CM | POA: Diagnosis not present

## 2022-12-01 DIAGNOSIS — I739 Peripheral vascular disease, unspecified: Secondary | ICD-10-CM | POA: Diagnosis not present

## 2022-12-03 DIAGNOSIS — E1140 Type 2 diabetes mellitus with diabetic neuropathy, unspecified: Secondary | ICD-10-CM | POA: Diagnosis not present

## 2022-12-03 DIAGNOSIS — I10 Essential (primary) hypertension: Secondary | ICD-10-CM | POA: Diagnosis not present

## 2022-12-21 DIAGNOSIS — I10 Essential (primary) hypertension: Secondary | ICD-10-CM | POA: Diagnosis not present

## 2022-12-21 DIAGNOSIS — K3184 Gastroparesis: Secondary | ICD-10-CM | POA: Diagnosis not present

## 2022-12-21 DIAGNOSIS — K219 Gastro-esophageal reflux disease without esophagitis: Secondary | ICD-10-CM | POA: Diagnosis not present

## 2022-12-21 DIAGNOSIS — E785 Hyperlipidemia, unspecified: Secondary | ICD-10-CM | POA: Diagnosis not present

## 2022-12-21 DIAGNOSIS — E1143 Type 2 diabetes mellitus with diabetic autonomic (poly)neuropathy: Secondary | ICD-10-CM | POA: Diagnosis not present

## 2022-12-28 DIAGNOSIS — F03A0 Unspecified dementia, mild, without behavioral disturbance, psychotic disturbance, mood disturbance, and anxiety: Secondary | ICD-10-CM | POA: Diagnosis not present

## 2023-01-01 DIAGNOSIS — I10 Essential (primary) hypertension: Secondary | ICD-10-CM | POA: Diagnosis not present

## 2023-01-01 DIAGNOSIS — M6281 Muscle weakness (generalized): Secondary | ICD-10-CM | POA: Diagnosis not present

## 2023-01-01 DIAGNOSIS — E559 Vitamin D deficiency, unspecified: Secondary | ICD-10-CM | POA: Diagnosis not present

## 2023-01-07 DIAGNOSIS — I10 Essential (primary) hypertension: Secondary | ICD-10-CM | POA: Diagnosis not present

## 2023-01-07 DIAGNOSIS — E785 Hyperlipidemia, unspecified: Secondary | ICD-10-CM | POA: Diagnosis not present

## 2023-01-07 DIAGNOSIS — F03A0 Unspecified dementia, mild, without behavioral disturbance, psychotic disturbance, mood disturbance, and anxiety: Secondary | ICD-10-CM | POA: Diagnosis not present

## 2023-01-07 DIAGNOSIS — E538 Deficiency of other specified B group vitamins: Secondary | ICD-10-CM | POA: Diagnosis not present

## 2023-01-07 DIAGNOSIS — E1143 Type 2 diabetes mellitus with diabetic autonomic (poly)neuropathy: Secondary | ICD-10-CM | POA: Diagnosis not present

## 2023-01-18 DIAGNOSIS — I10 Essential (primary) hypertension: Secondary | ICD-10-CM | POA: Diagnosis not present

## 2023-01-18 DIAGNOSIS — K3184 Gastroparesis: Secondary | ICD-10-CM | POA: Diagnosis not present

## 2023-01-18 DIAGNOSIS — E785 Hyperlipidemia, unspecified: Secondary | ICD-10-CM | POA: Diagnosis not present

## 2023-01-18 DIAGNOSIS — E1143 Type 2 diabetes mellitus with diabetic autonomic (poly)neuropathy: Secondary | ICD-10-CM | POA: Diagnosis not present

## 2023-01-18 DIAGNOSIS — K219 Gastro-esophageal reflux disease without esophagitis: Secondary | ICD-10-CM | POA: Diagnosis not present

## 2023-02-04 DIAGNOSIS — R5381 Other malaise: Secondary | ICD-10-CM | POA: Diagnosis not present

## 2023-02-04 DIAGNOSIS — E1143 Type 2 diabetes mellitus with diabetic autonomic (poly)neuropathy: Secondary | ICD-10-CM | POA: Diagnosis not present

## 2023-02-04 DIAGNOSIS — E538 Deficiency of other specified B group vitamins: Secondary | ICD-10-CM | POA: Diagnosis not present

## 2023-02-04 DIAGNOSIS — N189 Chronic kidney disease, unspecified: Secondary | ICD-10-CM | POA: Diagnosis not present

## 2023-02-04 DIAGNOSIS — I10 Essential (primary) hypertension: Secondary | ICD-10-CM | POA: Diagnosis not present

## 2023-02-04 DIAGNOSIS — E785 Hyperlipidemia, unspecified: Secondary | ICD-10-CM | POA: Diagnosis not present

## 2023-02-04 DIAGNOSIS — F4322 Adjustment disorder with anxiety: Secondary | ICD-10-CM | POA: Diagnosis not present

## 2023-02-04 DIAGNOSIS — M109 Gout, unspecified: Secondary | ICD-10-CM | POA: Diagnosis not present

## 2023-02-24 DIAGNOSIS — E1151 Type 2 diabetes mellitus with diabetic peripheral angiopathy without gangrene: Secondary | ICD-10-CM | POA: Diagnosis not present

## 2023-02-24 DIAGNOSIS — L603 Nail dystrophy: Secondary | ICD-10-CM | POA: Diagnosis not present

## 2023-02-25 DIAGNOSIS — N189 Chronic kidney disease, unspecified: Secondary | ICD-10-CM | POA: Diagnosis not present

## 2023-02-25 DIAGNOSIS — E785 Hyperlipidemia, unspecified: Secondary | ICD-10-CM | POA: Diagnosis not present

## 2023-02-25 DIAGNOSIS — M109 Gout, unspecified: Secondary | ICD-10-CM | POA: Diagnosis not present

## 2023-02-25 DIAGNOSIS — E119 Type 2 diabetes mellitus without complications: Secondary | ICD-10-CM | POA: Diagnosis not present

## 2023-02-25 DIAGNOSIS — R5381 Other malaise: Secondary | ICD-10-CM | POA: Diagnosis not present

## 2023-02-25 DIAGNOSIS — I10 Essential (primary) hypertension: Secondary | ICD-10-CM | POA: Diagnosis not present

## 2023-02-25 DIAGNOSIS — L918 Other hypertrophic disorders of the skin: Secondary | ICD-10-CM | POA: Diagnosis not present

## 2023-02-26 DIAGNOSIS — F0390 Unspecified dementia without behavioral disturbance: Secondary | ICD-10-CM | POA: Diagnosis not present

## 2023-02-26 DIAGNOSIS — E039 Hypothyroidism, unspecified: Secondary | ICD-10-CM | POA: Diagnosis not present

## 2023-02-26 DIAGNOSIS — E785 Hyperlipidemia, unspecified: Secondary | ICD-10-CM | POA: Diagnosis not present

## 2023-02-26 DIAGNOSIS — E118 Type 2 diabetes mellitus with unspecified complications: Secondary | ICD-10-CM | POA: Diagnosis not present

## 2023-02-26 DIAGNOSIS — I129 Hypertensive chronic kidney disease with stage 1 through stage 4 chronic kidney disease, or unspecified chronic kidney disease: Secondary | ICD-10-CM | POA: Diagnosis not present

## 2023-02-26 DIAGNOSIS — E559 Vitamin D deficiency, unspecified: Secondary | ICD-10-CM | POA: Diagnosis not present

## 2023-02-26 DIAGNOSIS — I10 Essential (primary) hypertension: Secondary | ICD-10-CM | POA: Diagnosis not present

## 2023-03-06 DIAGNOSIS — E785 Hyperlipidemia, unspecified: Secondary | ICD-10-CM | POA: Diagnosis not present

## 2023-03-06 DIAGNOSIS — E1143 Type 2 diabetes mellitus with diabetic autonomic (poly)neuropathy: Secondary | ICD-10-CM | POA: Diagnosis not present

## 2023-03-06 DIAGNOSIS — I10 Essential (primary) hypertension: Secondary | ICD-10-CM | POA: Diagnosis not present

## 2023-03-06 DIAGNOSIS — R5381 Other malaise: Secondary | ICD-10-CM | POA: Diagnosis not present

## 2023-03-06 DIAGNOSIS — N189 Chronic kidney disease, unspecified: Secondary | ICD-10-CM | POA: Diagnosis not present

## 2023-03-08 DIAGNOSIS — I10 Essential (primary) hypertension: Secondary | ICD-10-CM | POA: Diagnosis not present

## 2023-03-08 DIAGNOSIS — E1140 Type 2 diabetes mellitus with diabetic neuropathy, unspecified: Secondary | ICD-10-CM | POA: Diagnosis not present

## 2023-03-24 DIAGNOSIS — F4322 Adjustment disorder with anxiety: Secondary | ICD-10-CM | POA: Diagnosis not present

## 2023-03-31 DIAGNOSIS — I129 Hypertensive chronic kidney disease with stage 1 through stage 4 chronic kidney disease, or unspecified chronic kidney disease: Secondary | ICD-10-CM | POA: Diagnosis not present

## 2023-03-31 DIAGNOSIS — E118 Type 2 diabetes mellitus with unspecified complications: Secondary | ICD-10-CM | POA: Diagnosis not present

## 2023-03-31 DIAGNOSIS — E785 Hyperlipidemia, unspecified: Secondary | ICD-10-CM | POA: Diagnosis not present

## 2023-03-31 DIAGNOSIS — F4322 Adjustment disorder with anxiety: Secondary | ICD-10-CM | POA: Diagnosis not present

## 2023-03-31 DIAGNOSIS — E039 Hypothyroidism, unspecified: Secondary | ICD-10-CM | POA: Diagnosis not present

## 2023-03-31 DIAGNOSIS — I10 Essential (primary) hypertension: Secondary | ICD-10-CM | POA: Diagnosis not present

## 2023-03-31 DIAGNOSIS — E559 Vitamin D deficiency, unspecified: Secondary | ICD-10-CM | POA: Diagnosis not present

## 2023-03-31 DIAGNOSIS — F0390 Unspecified dementia without behavioral disturbance: Secondary | ICD-10-CM | POA: Diagnosis not present

## 2023-04-06 DIAGNOSIS — M25511 Pain in right shoulder: Secondary | ICD-10-CM | POA: Diagnosis not present

## 2023-04-08 DIAGNOSIS — N189 Chronic kidney disease, unspecified: Secondary | ICD-10-CM | POA: Diagnosis not present

## 2023-04-08 DIAGNOSIS — F03A0 Unspecified dementia, mild, without behavioral disturbance, psychotic disturbance, mood disturbance, and anxiety: Secondary | ICD-10-CM | POA: Diagnosis not present

## 2023-04-08 DIAGNOSIS — E538 Deficiency of other specified B group vitamins: Secondary | ICD-10-CM | POA: Diagnosis not present

## 2023-04-08 DIAGNOSIS — R5381 Other malaise: Secondary | ICD-10-CM | POA: Diagnosis not present

## 2023-04-08 DIAGNOSIS — E1143 Type 2 diabetes mellitus with diabetic autonomic (poly)neuropathy: Secondary | ICD-10-CM | POA: Diagnosis not present

## 2023-04-08 DIAGNOSIS — E785 Hyperlipidemia, unspecified: Secondary | ICD-10-CM | POA: Diagnosis not present

## 2023-04-08 DIAGNOSIS — I10 Essential (primary) hypertension: Secondary | ICD-10-CM | POA: Diagnosis not present

## 2023-04-26 DIAGNOSIS — E785 Hyperlipidemia, unspecified: Secondary | ICD-10-CM | POA: Diagnosis not present

## 2023-04-26 DIAGNOSIS — E039 Hypothyroidism, unspecified: Secondary | ICD-10-CM | POA: Diagnosis not present

## 2023-04-26 DIAGNOSIS — F0390 Unspecified dementia without behavioral disturbance: Secondary | ICD-10-CM | POA: Diagnosis not present

## 2023-04-26 DIAGNOSIS — E118 Type 2 diabetes mellitus with unspecified complications: Secondary | ICD-10-CM | POA: Diagnosis not present

## 2023-04-26 DIAGNOSIS — I10 Essential (primary) hypertension: Secondary | ICD-10-CM | POA: Diagnosis not present

## 2023-04-26 DIAGNOSIS — I129 Hypertensive chronic kidney disease with stage 1 through stage 4 chronic kidney disease, or unspecified chronic kidney disease: Secondary | ICD-10-CM | POA: Diagnosis not present

## 2023-04-26 DIAGNOSIS — E559 Vitamin D deficiency, unspecified: Secondary | ICD-10-CM | POA: Diagnosis not present

## 2023-04-30 DIAGNOSIS — F4322 Adjustment disorder with anxiety: Secondary | ICD-10-CM | POA: Diagnosis not present

## 2023-05-06 DIAGNOSIS — E785 Hyperlipidemia, unspecified: Secondary | ICD-10-CM | POA: Diagnosis not present

## 2023-05-06 DIAGNOSIS — I10 Essential (primary) hypertension: Secondary | ICD-10-CM | POA: Diagnosis not present

## 2023-05-06 DIAGNOSIS — N189 Chronic kidney disease, unspecified: Secondary | ICD-10-CM | POA: Diagnosis not present

## 2023-05-06 DIAGNOSIS — E1143 Type 2 diabetes mellitus with diabetic autonomic (poly)neuropathy: Secondary | ICD-10-CM | POA: Diagnosis not present

## 2023-05-06 DIAGNOSIS — R5381 Other malaise: Secondary | ICD-10-CM | POA: Diagnosis not present

## 2023-05-06 DIAGNOSIS — E538 Deficiency of other specified B group vitamins: Secondary | ICD-10-CM | POA: Diagnosis not present

## 2023-05-14 DIAGNOSIS — L918 Other hypertrophic disorders of the skin: Secondary | ICD-10-CM | POA: Diagnosis not present

## 2023-05-14 DIAGNOSIS — E119 Type 2 diabetes mellitus without complications: Secondary | ICD-10-CM | POA: Diagnosis not present

## 2023-05-14 DIAGNOSIS — N189 Chronic kidney disease, unspecified: Secondary | ICD-10-CM | POA: Diagnosis not present

## 2023-05-14 DIAGNOSIS — M109 Gout, unspecified: Secondary | ICD-10-CM | POA: Diagnosis not present

## 2023-05-14 DIAGNOSIS — R5381 Other malaise: Secondary | ICD-10-CM | POA: Diagnosis not present

## 2023-05-14 DIAGNOSIS — E785 Hyperlipidemia, unspecified: Secondary | ICD-10-CM | POA: Diagnosis not present

## 2023-05-14 DIAGNOSIS — I10 Essential (primary) hypertension: Secondary | ICD-10-CM | POA: Diagnosis not present

## 2023-05-19 DIAGNOSIS — I452 Bifascicular block: Secondary | ICD-10-CM | POA: Diagnosis not present

## 2023-05-19 DIAGNOSIS — I959 Hypotension, unspecified: Secondary | ICD-10-CM | POA: Diagnosis not present

## 2023-05-19 DIAGNOSIS — N3090 Cystitis, unspecified without hematuria: Secondary | ICD-10-CM | POA: Diagnosis not present

## 2023-05-19 DIAGNOSIS — F03918 Unspecified dementia, unspecified severity, with other behavioral disturbance: Secondary | ICD-10-CM | POA: Diagnosis not present

## 2023-05-19 DIAGNOSIS — R402431 Glasgow coma scale score 3-8, in the field [EMT or ambulance]: Secondary | ICD-10-CM | POA: Diagnosis not present

## 2023-05-19 DIAGNOSIS — R4182 Altered mental status, unspecified: Secondary | ICD-10-CM | POA: Diagnosis not present

## 2023-05-19 DIAGNOSIS — R404 Transient alteration of awareness: Secondary | ICD-10-CM | POA: Diagnosis not present

## 2023-05-19 DIAGNOSIS — N3000 Acute cystitis without hematuria: Secondary | ICD-10-CM | POA: Diagnosis not present

## 2023-05-19 DIAGNOSIS — I491 Atrial premature depolarization: Secondary | ICD-10-CM | POA: Diagnosis not present

## 2023-05-20 DIAGNOSIS — I452 Bifascicular block: Secondary | ICD-10-CM | POA: Diagnosis not present

## 2023-05-20 DIAGNOSIS — R5381 Other malaise: Secondary | ICD-10-CM | POA: Diagnosis not present

## 2023-05-20 DIAGNOSIS — I491 Atrial premature depolarization: Secondary | ICD-10-CM | POA: Diagnosis not present

## 2023-05-20 DIAGNOSIS — R270 Ataxia, unspecified: Secondary | ICD-10-CM | POA: Diagnosis not present

## 2023-05-21 DIAGNOSIS — F4322 Adjustment disorder with anxiety: Secondary | ICD-10-CM | POA: Diagnosis not present

## 2023-05-23 DIAGNOSIS — M25569 Pain in unspecified knee: Secondary | ICD-10-CM | POA: Diagnosis not present

## 2023-05-23 DIAGNOSIS — R4182 Altered mental status, unspecified: Secondary | ICD-10-CM | POA: Diagnosis not present

## 2023-05-23 DIAGNOSIS — S199XXA Unspecified injury of neck, initial encounter: Secondary | ICD-10-CM | POA: Diagnosis not present

## 2023-05-23 DIAGNOSIS — E785 Hyperlipidemia, unspecified: Secondary | ICD-10-CM | POA: Diagnosis not present

## 2023-05-23 DIAGNOSIS — F03A0 Unspecified dementia, mild, without behavioral disturbance, psychotic disturbance, mood disturbance, and anxiety: Secondary | ICD-10-CM | POA: Diagnosis not present

## 2023-05-23 DIAGNOSIS — N179 Acute kidney failure, unspecified: Secondary | ICD-10-CM | POA: Diagnosis not present

## 2023-05-23 DIAGNOSIS — F0390 Unspecified dementia without behavioral disturbance: Secondary | ICD-10-CM | POA: Diagnosis not present

## 2023-05-23 DIAGNOSIS — N390 Urinary tract infection, site not specified: Secondary | ICD-10-CM | POA: Diagnosis not present

## 2023-05-23 DIAGNOSIS — M9712XA Periprosthetic fracture around internal prosthetic left knee joint, initial encounter: Secondary | ICD-10-CM | POA: Diagnosis not present

## 2023-05-23 DIAGNOSIS — M109 Gout, unspecified: Secondary | ICD-10-CM | POA: Diagnosis not present

## 2023-05-23 DIAGNOSIS — E118 Type 2 diabetes mellitus with unspecified complications: Secondary | ICD-10-CM | POA: Diagnosis not present

## 2023-05-23 DIAGNOSIS — I499 Cardiac arrhythmia, unspecified: Secondary | ICD-10-CM | POA: Diagnosis not present

## 2023-05-23 DIAGNOSIS — R2689 Other abnormalities of gait and mobility: Secondary | ICD-10-CM | POA: Diagnosis not present

## 2023-05-23 DIAGNOSIS — M6281 Muscle weakness (generalized): Secondary | ICD-10-CM | POA: Diagnosis not present

## 2023-05-23 DIAGNOSIS — Z043 Encounter for examination and observation following other accident: Secondary | ICD-10-CM | POA: Diagnosis not present

## 2023-05-23 DIAGNOSIS — D509 Iron deficiency anemia, unspecified: Secondary | ICD-10-CM | POA: Diagnosis not present

## 2023-05-23 DIAGNOSIS — N178 Other acute kidney failure: Secondary | ICD-10-CM | POA: Diagnosis not present

## 2023-05-23 DIAGNOSIS — I129 Hypertensive chronic kidney disease with stage 1 through stage 4 chronic kidney disease, or unspecified chronic kidney disease: Secondary | ICD-10-CM | POA: Diagnosis not present

## 2023-05-23 DIAGNOSIS — M79605 Pain in left leg: Secondary | ICD-10-CM | POA: Diagnosis not present

## 2023-05-23 DIAGNOSIS — S0990XA Unspecified injury of head, initial encounter: Secondary | ICD-10-CM | POA: Diagnosis not present

## 2023-05-23 DIAGNOSIS — E11649 Type 2 diabetes mellitus with hypoglycemia without coma: Secondary | ICD-10-CM | POA: Diagnosis not present

## 2023-05-23 DIAGNOSIS — Z794 Long term (current) use of insulin: Secondary | ICD-10-CM | POA: Diagnosis not present

## 2023-05-23 DIAGNOSIS — M546 Pain in thoracic spine: Secondary | ICD-10-CM | POA: Diagnosis not present

## 2023-05-23 DIAGNOSIS — I4510 Unspecified right bundle-branch block: Secondary | ICD-10-CM | POA: Diagnosis not present

## 2023-05-23 DIAGNOSIS — W19XXXA Unspecified fall, initial encounter: Secondary | ICD-10-CM | POA: Diagnosis not present

## 2023-05-23 DIAGNOSIS — E559 Vitamin D deficiency, unspecified: Secondary | ICD-10-CM | POA: Diagnosis not present

## 2023-05-23 DIAGNOSIS — Z471 Aftercare following joint replacement surgery: Secondary | ICD-10-CM | POA: Diagnosis not present

## 2023-05-23 DIAGNOSIS — S7292XA Unspecified fracture of left femur, initial encounter for closed fracture: Secondary | ICD-10-CM | POA: Diagnosis not present

## 2023-05-23 DIAGNOSIS — Z96652 Presence of left artificial knee joint: Secondary | ICD-10-CM | POA: Diagnosis not present

## 2023-05-23 DIAGNOSIS — I9589 Other hypotension: Secondary | ICD-10-CM | POA: Diagnosis not present

## 2023-05-23 DIAGNOSIS — R410 Disorientation, unspecified: Secondary | ICD-10-CM | POA: Diagnosis not present

## 2023-05-23 DIAGNOSIS — E119 Type 2 diabetes mellitus without complications: Secondary | ICD-10-CM | POA: Diagnosis not present

## 2023-05-23 DIAGNOSIS — S72002A Fracture of unspecified part of neck of left femur, initial encounter for closed fracture: Secondary | ICD-10-CM | POA: Diagnosis not present

## 2023-05-23 DIAGNOSIS — G8911 Acute pain due to trauma: Secondary | ICD-10-CM | POA: Diagnosis not present

## 2023-05-23 DIAGNOSIS — Z7982 Long term (current) use of aspirin: Secondary | ICD-10-CM | POA: Diagnosis not present

## 2023-05-23 DIAGNOSIS — E039 Hypothyroidism, unspecified: Secondary | ICD-10-CM | POA: Diagnosis not present

## 2023-05-23 DIAGNOSIS — I10 Essential (primary) hypertension: Secondary | ICD-10-CM | POA: Diagnosis not present

## 2023-05-23 DIAGNOSIS — S72352A Displaced comminuted fracture of shaft of left femur, initial encounter for closed fracture: Secondary | ICD-10-CM | POA: Diagnosis not present

## 2023-05-23 DIAGNOSIS — D62 Acute posthemorrhagic anemia: Secondary | ICD-10-CM | POA: Diagnosis not present

## 2023-05-23 DIAGNOSIS — R0789 Other chest pain: Secondary | ICD-10-CM | POA: Diagnosis not present

## 2023-05-24 DIAGNOSIS — N179 Acute kidney failure, unspecified: Secondary | ICD-10-CM | POA: Diagnosis not present

## 2023-05-24 DIAGNOSIS — E785 Hyperlipidemia, unspecified: Secondary | ICD-10-CM | POA: Diagnosis not present

## 2023-05-24 DIAGNOSIS — E119 Type 2 diabetes mellitus without complications: Secondary | ICD-10-CM | POA: Diagnosis not present

## 2023-05-24 DIAGNOSIS — I10 Essential (primary) hypertension: Secondary | ICD-10-CM | POA: Diagnosis not present

## 2023-05-24 DIAGNOSIS — R410 Disorientation, unspecified: Secondary | ICD-10-CM | POA: Diagnosis not present

## 2023-05-24 DIAGNOSIS — M109 Gout, unspecified: Secondary | ICD-10-CM | POA: Diagnosis not present

## 2023-05-24 DIAGNOSIS — M9712XA Periprosthetic fracture around internal prosthetic left knee joint, initial encounter: Secondary | ICD-10-CM | POA: Diagnosis not present

## 2023-05-25 DIAGNOSIS — E785 Hyperlipidemia, unspecified: Secondary | ICD-10-CM | POA: Diagnosis not present

## 2023-05-25 DIAGNOSIS — R410 Disorientation, unspecified: Secondary | ICD-10-CM | POA: Diagnosis not present

## 2023-05-25 DIAGNOSIS — M109 Gout, unspecified: Secondary | ICD-10-CM | POA: Diagnosis not present

## 2023-05-25 DIAGNOSIS — I10 Essential (primary) hypertension: Secondary | ICD-10-CM | POA: Diagnosis not present

## 2023-05-25 DIAGNOSIS — E119 Type 2 diabetes mellitus without complications: Secondary | ICD-10-CM | POA: Diagnosis not present

## 2023-05-25 DIAGNOSIS — M9712XA Periprosthetic fracture around internal prosthetic left knee joint, initial encounter: Secondary | ICD-10-CM | POA: Diagnosis not present

## 2023-05-25 DIAGNOSIS — N179 Acute kidney failure, unspecified: Secondary | ICD-10-CM | POA: Diagnosis not present

## 2023-05-26 DIAGNOSIS — I10 Essential (primary) hypertension: Secondary | ICD-10-CM | POA: Diagnosis not present

## 2023-05-26 DIAGNOSIS — M109 Gout, unspecified: Secondary | ICD-10-CM | POA: Diagnosis not present

## 2023-05-26 DIAGNOSIS — M9712XA Periprosthetic fracture around internal prosthetic left knee joint, initial encounter: Secondary | ICD-10-CM | POA: Diagnosis not present

## 2023-05-26 DIAGNOSIS — E785 Hyperlipidemia, unspecified: Secondary | ICD-10-CM | POA: Diagnosis not present

## 2023-05-26 DIAGNOSIS — R410 Disorientation, unspecified: Secondary | ICD-10-CM | POA: Diagnosis not present

## 2023-05-26 DIAGNOSIS — N179 Acute kidney failure, unspecified: Secondary | ICD-10-CM | POA: Diagnosis not present

## 2023-05-26 DIAGNOSIS — E119 Type 2 diabetes mellitus without complications: Secondary | ICD-10-CM | POA: Diagnosis not present

## 2023-05-30 DIAGNOSIS — F0390 Unspecified dementia without behavioral disturbance: Secondary | ICD-10-CM | POA: Diagnosis not present

## 2023-05-30 DIAGNOSIS — E785 Hyperlipidemia, unspecified: Secondary | ICD-10-CM | POA: Diagnosis not present

## 2023-05-30 DIAGNOSIS — I129 Hypertensive chronic kidney disease with stage 1 through stage 4 chronic kidney disease, or unspecified chronic kidney disease: Secondary | ICD-10-CM | POA: Diagnosis not present

## 2023-05-30 DIAGNOSIS — M9712XA Periprosthetic fracture around internal prosthetic left knee joint, initial encounter: Secondary | ICD-10-CM | POA: Diagnosis not present

## 2023-05-30 DIAGNOSIS — I10 Essential (primary) hypertension: Secondary | ICD-10-CM | POA: Diagnosis not present

## 2023-05-30 DIAGNOSIS — S7292XA Unspecified fracture of left femur, initial encounter for closed fracture: Secondary | ICD-10-CM | POA: Diagnosis not present

## 2023-05-30 DIAGNOSIS — E039 Hypothyroidism, unspecified: Secondary | ICD-10-CM | POA: Diagnosis not present

## 2023-05-30 DIAGNOSIS — E118 Type 2 diabetes mellitus with unspecified complications: Secondary | ICD-10-CM | POA: Diagnosis not present

## 2023-05-30 DIAGNOSIS — W19XXXA Unspecified fall, initial encounter: Secondary | ICD-10-CM | POA: Diagnosis not present

## 2023-05-30 DIAGNOSIS — E559 Vitamin D deficiency, unspecified: Secondary | ICD-10-CM | POA: Diagnosis not present

## 2023-05-31 DIAGNOSIS — M6281 Muscle weakness (generalized): Secondary | ICD-10-CM | POA: Diagnosis not present

## 2023-05-31 DIAGNOSIS — R2689 Other abnormalities of gait and mobility: Secondary | ICD-10-CM | POA: Diagnosis not present

## 2023-05-31 DIAGNOSIS — F03A0 Unspecified dementia, mild, without behavioral disturbance, psychotic disturbance, mood disturbance, and anxiety: Secondary | ICD-10-CM | POA: Diagnosis not present

## 2023-06-09 DIAGNOSIS — R42 Dizziness and giddiness: Secondary | ICD-10-CM | POA: Diagnosis not present

## 2023-06-11 DIAGNOSIS — F4322 Adjustment disorder with anxiety: Secondary | ICD-10-CM | POA: Diagnosis not present

## 2023-06-14 DIAGNOSIS — F0390 Unspecified dementia without behavioral disturbance: Secondary | ICD-10-CM | POA: Diagnosis not present

## 2023-06-14 DIAGNOSIS — M9712XD Periprosthetic fracture around internal prosthetic left knee joint, subsequent encounter: Secondary | ICD-10-CM | POA: Diagnosis not present

## 2023-06-14 DIAGNOSIS — E1122 Type 2 diabetes mellitus with diabetic chronic kidney disease: Secondary | ICD-10-CM | POA: Diagnosis not present

## 2023-06-14 DIAGNOSIS — I129 Hypertensive chronic kidney disease with stage 1 through stage 4 chronic kidney disease, or unspecified chronic kidney disease: Secondary | ICD-10-CM | POA: Diagnosis not present

## 2023-06-15 DIAGNOSIS — M6281 Muscle weakness (generalized): Secondary | ICD-10-CM | POA: Diagnosis not present

## 2023-06-15 DIAGNOSIS — F0390 Unspecified dementia without behavioral disturbance: Secondary | ICD-10-CM | POA: Diagnosis not present

## 2023-06-17 DIAGNOSIS — Z96649 Presence of unspecified artificial hip joint: Secondary | ICD-10-CM | POA: Diagnosis not present

## 2023-06-17 DIAGNOSIS — M978XXA Periprosthetic fracture around other internal prosthetic joint, initial encounter: Secondary | ICD-10-CM | POA: Diagnosis not present

## 2023-06-18 DIAGNOSIS — F4322 Adjustment disorder with anxiety: Secondary | ICD-10-CM | POA: Diagnosis not present

## 2023-06-28 DIAGNOSIS — D631 Anemia in chronic kidney disease: Secondary | ICD-10-CM | POA: Diagnosis not present

## 2023-06-28 DIAGNOSIS — I10 Essential (primary) hypertension: Secondary | ICD-10-CM | POA: Diagnosis not present

## 2023-06-28 DIAGNOSIS — F0390 Unspecified dementia without behavioral disturbance: Secondary | ICD-10-CM | POA: Diagnosis not present

## 2023-06-28 DIAGNOSIS — E785 Hyperlipidemia, unspecified: Secondary | ICD-10-CM | POA: Diagnosis not present

## 2023-06-30 DIAGNOSIS — F03A0 Unspecified dementia, mild, without behavioral disturbance, psychotic disturbance, mood disturbance, and anxiety: Secondary | ICD-10-CM | POA: Diagnosis not present

## 2023-06-30 DIAGNOSIS — I129 Hypertensive chronic kidney disease with stage 1 through stage 4 chronic kidney disease, or unspecified chronic kidney disease: Secondary | ICD-10-CM | POA: Diagnosis not present

## 2023-07-01 DIAGNOSIS — E559 Vitamin D deficiency, unspecified: Secondary | ICD-10-CM | POA: Diagnosis not present

## 2023-07-01 DIAGNOSIS — F0390 Unspecified dementia without behavioral disturbance: Secondary | ICD-10-CM | POA: Diagnosis not present

## 2023-07-01 DIAGNOSIS — I129 Hypertensive chronic kidney disease with stage 1 through stage 4 chronic kidney disease, or unspecified chronic kidney disease: Secondary | ICD-10-CM | POA: Diagnosis not present

## 2023-07-01 DIAGNOSIS — E785 Hyperlipidemia, unspecified: Secondary | ICD-10-CM | POA: Diagnosis not present

## 2023-07-01 DIAGNOSIS — I10 Essential (primary) hypertension: Secondary | ICD-10-CM | POA: Diagnosis not present

## 2023-07-01 DIAGNOSIS — E039 Hypothyroidism, unspecified: Secondary | ICD-10-CM | POA: Diagnosis not present

## 2023-07-01 DIAGNOSIS — E118 Type 2 diabetes mellitus with unspecified complications: Secondary | ICD-10-CM | POA: Diagnosis not present

## 2023-07-02 DIAGNOSIS — F4322 Adjustment disorder with anxiety: Secondary | ICD-10-CM | POA: Diagnosis not present

## 2023-07-05 DIAGNOSIS — Z96649 Presence of unspecified artificial hip joint: Secondary | ICD-10-CM | POA: Diagnosis not present

## 2023-07-05 DIAGNOSIS — Z96642 Presence of left artificial hip joint: Secondary | ICD-10-CM | POA: Diagnosis not present

## 2023-07-05 DIAGNOSIS — N189 Chronic kidney disease, unspecified: Secondary | ICD-10-CM | POA: Diagnosis not present

## 2023-07-05 DIAGNOSIS — M978XXA Periprosthetic fracture around other internal prosthetic joint, initial encounter: Secondary | ICD-10-CM | POA: Diagnosis not present

## 2023-07-16 DIAGNOSIS — F4322 Adjustment disorder with anxiety: Secondary | ICD-10-CM | POA: Diagnosis not present

## 2023-07-18 DIAGNOSIS — D631 Anemia in chronic kidney disease: Secondary | ICD-10-CM | POA: Diagnosis not present

## 2023-07-18 DIAGNOSIS — F03A0 Unspecified dementia, mild, without behavioral disturbance, psychotic disturbance, mood disturbance, and anxiety: Secondary | ICD-10-CM | POA: Diagnosis not present

## 2023-07-18 DIAGNOSIS — E785 Hyperlipidemia, unspecified: Secondary | ICD-10-CM | POA: Diagnosis not present

## 2023-07-18 DIAGNOSIS — I10 Essential (primary) hypertension: Secondary | ICD-10-CM | POA: Diagnosis not present

## 2023-07-19 DIAGNOSIS — E1122 Type 2 diabetes mellitus with diabetic chronic kidney disease: Secondary | ICD-10-CM | POA: Diagnosis not present

## 2023-07-19 DIAGNOSIS — F03A0 Unspecified dementia, mild, without behavioral disturbance, psychotic disturbance, mood disturbance, and anxiety: Secondary | ICD-10-CM | POA: Diagnosis not present

## 2023-07-19 DIAGNOSIS — I129 Hypertensive chronic kidney disease with stage 1 through stage 4 chronic kidney disease, or unspecified chronic kidney disease: Secondary | ICD-10-CM | POA: Diagnosis not present

## 2023-07-19 DIAGNOSIS — M9712XD Periprosthetic fracture around internal prosthetic left knee joint, subsequent encounter: Secondary | ICD-10-CM | POA: Diagnosis not present

## 2023-08-03 DIAGNOSIS — F03A0 Unspecified dementia, mild, without behavioral disturbance, psychotic disturbance, mood disturbance, and anxiety: Secondary | ICD-10-CM | POA: Diagnosis not present

## 2023-08-04 DIAGNOSIS — N189 Chronic kidney disease, unspecified: Secondary | ICD-10-CM | POA: Diagnosis not present

## 2023-08-06 DIAGNOSIS — F4322 Adjustment disorder with anxiety: Secondary | ICD-10-CM | POA: Diagnosis not present

## 2023-08-10 DIAGNOSIS — E1122 Type 2 diabetes mellitus with diabetic chronic kidney disease: Secondary | ICD-10-CM | POA: Diagnosis not present

## 2023-08-10 DIAGNOSIS — I129 Hypertensive chronic kidney disease with stage 1 through stage 4 chronic kidney disease, or unspecified chronic kidney disease: Secondary | ICD-10-CM | POA: Diagnosis not present

## 2023-08-10 DIAGNOSIS — F03A0 Unspecified dementia, mild, without behavioral disturbance, psychotic disturbance, mood disturbance, and anxiety: Secondary | ICD-10-CM | POA: Diagnosis not present

## 2023-08-16 DIAGNOSIS — F03A0 Unspecified dementia, mild, without behavioral disturbance, psychotic disturbance, mood disturbance, and anxiety: Secondary | ICD-10-CM | POA: Diagnosis not present

## 2023-08-16 DIAGNOSIS — M9712XD Periprosthetic fracture around internal prosthetic left knee joint, subsequent encounter: Secondary | ICD-10-CM | POA: Diagnosis not present

## 2023-08-16 DIAGNOSIS — E1122 Type 2 diabetes mellitus with diabetic chronic kidney disease: Secondary | ICD-10-CM | POA: Diagnosis not present

## 2023-08-16 DIAGNOSIS — I129 Hypertensive chronic kidney disease with stage 1 through stage 4 chronic kidney disease, or unspecified chronic kidney disease: Secondary | ICD-10-CM | POA: Diagnosis not present

## 2023-08-19 DIAGNOSIS — E1122 Type 2 diabetes mellitus with diabetic chronic kidney disease: Secondary | ICD-10-CM | POA: Diagnosis not present

## 2023-08-19 DIAGNOSIS — F03A0 Unspecified dementia, mild, without behavioral disturbance, psychotic disturbance, mood disturbance, and anxiety: Secondary | ICD-10-CM | POA: Diagnosis not present

## 2023-08-19 DIAGNOSIS — I129 Hypertensive chronic kidney disease with stage 1 through stage 4 chronic kidney disease, or unspecified chronic kidney disease: Secondary | ICD-10-CM | POA: Diagnosis not present

## 2023-08-20 DIAGNOSIS — F4322 Adjustment disorder with anxiety: Secondary | ICD-10-CM | POA: Diagnosis not present

## 2023-08-28 DIAGNOSIS — F03A0 Unspecified dementia, mild, without behavioral disturbance, psychotic disturbance, mood disturbance, and anxiety: Secondary | ICD-10-CM | POA: Diagnosis not present

## 2023-08-28 DIAGNOSIS — E785 Hyperlipidemia, unspecified: Secondary | ICD-10-CM | POA: Diagnosis not present

## 2023-08-28 DIAGNOSIS — D631 Anemia in chronic kidney disease: Secondary | ICD-10-CM | POA: Diagnosis not present

## 2023-08-28 DIAGNOSIS — I10 Essential (primary) hypertension: Secondary | ICD-10-CM | POA: Diagnosis not present

## 2023-09-03 DIAGNOSIS — F03A0 Unspecified dementia, mild, without behavioral disturbance, psychotic disturbance, mood disturbance, and anxiety: Secondary | ICD-10-CM | POA: Diagnosis not present

## 2023-09-03 DIAGNOSIS — I129 Hypertensive chronic kidney disease with stage 1 through stage 4 chronic kidney disease, or unspecified chronic kidney disease: Secondary | ICD-10-CM | POA: Diagnosis not present

## 2023-09-03 DIAGNOSIS — F4322 Adjustment disorder with anxiety: Secondary | ICD-10-CM | POA: Diagnosis not present

## 2023-09-03 DIAGNOSIS — E1122 Type 2 diabetes mellitus with diabetic chronic kidney disease: Secondary | ICD-10-CM | POA: Diagnosis not present

## 2023-09-10 DIAGNOSIS — F4322 Adjustment disorder with anxiety: Secondary | ICD-10-CM | POA: Diagnosis not present

## 2023-09-21 DIAGNOSIS — F03A0 Unspecified dementia, mild, without behavioral disturbance, psychotic disturbance, mood disturbance, and anxiety: Secondary | ICD-10-CM | POA: Diagnosis not present

## 2023-09-21 DIAGNOSIS — I129 Hypertensive chronic kidney disease with stage 1 through stage 4 chronic kidney disease, or unspecified chronic kidney disease: Secondary | ICD-10-CM | POA: Diagnosis not present

## 2023-09-21 DIAGNOSIS — E1122 Type 2 diabetes mellitus with diabetic chronic kidney disease: Secondary | ICD-10-CM | POA: Diagnosis not present

## 2023-09-21 DIAGNOSIS — N189 Chronic kidney disease, unspecified: Secondary | ICD-10-CM | POA: Diagnosis not present

## 2023-09-25 DIAGNOSIS — F03A0 Unspecified dementia, mild, without behavioral disturbance, psychotic disturbance, mood disturbance, and anxiety: Secondary | ICD-10-CM | POA: Diagnosis not present

## 2023-09-25 DIAGNOSIS — I129 Hypertensive chronic kidney disease with stage 1 through stage 4 chronic kidney disease, or unspecified chronic kidney disease: Secondary | ICD-10-CM | POA: Diagnosis not present

## 2023-09-26 DIAGNOSIS — F03A0 Unspecified dementia, mild, without behavioral disturbance, psychotic disturbance, mood disturbance, and anxiety: Secondary | ICD-10-CM | POA: Diagnosis not present

## 2023-09-26 DIAGNOSIS — I129 Hypertensive chronic kidney disease with stage 1 through stage 4 chronic kidney disease, or unspecified chronic kidney disease: Secondary | ICD-10-CM | POA: Diagnosis not present

## 2023-09-26 DIAGNOSIS — E1122 Type 2 diabetes mellitus with diabetic chronic kidney disease: Secondary | ICD-10-CM | POA: Diagnosis not present

## 2023-10-11 DIAGNOSIS — M978XXA Periprosthetic fracture around other internal prosthetic joint, initial encounter: Secondary | ICD-10-CM | POA: Diagnosis not present

## 2023-10-11 DIAGNOSIS — S72402D Unspecified fracture of lower end of left femur, subsequent encounter for closed fracture with routine healing: Secondary | ICD-10-CM | POA: Diagnosis not present

## 2023-10-11 DIAGNOSIS — Z96642 Presence of left artificial hip joint: Secondary | ICD-10-CM | POA: Diagnosis not present

## 2023-10-11 DIAGNOSIS — Z96649 Presence of unspecified artificial hip joint: Secondary | ICD-10-CM | POA: Diagnosis not present

## 2023-10-18 DIAGNOSIS — E1122 Type 2 diabetes mellitus with diabetic chronic kidney disease: Secondary | ICD-10-CM | POA: Diagnosis not present

## 2023-10-18 DIAGNOSIS — F03A0 Unspecified dementia, mild, without behavioral disturbance, psychotic disturbance, mood disturbance, and anxiety: Secondary | ICD-10-CM | POA: Diagnosis not present

## 2023-10-18 DIAGNOSIS — N189 Chronic kidney disease, unspecified: Secondary | ICD-10-CM | POA: Diagnosis not present

## 2023-10-18 DIAGNOSIS — I129 Hypertensive chronic kidney disease with stage 1 through stage 4 chronic kidney disease, or unspecified chronic kidney disease: Secondary | ICD-10-CM | POA: Diagnosis not present

## 2023-10-22 DIAGNOSIS — F4322 Adjustment disorder with anxiety: Secondary | ICD-10-CM | POA: Diagnosis not present

## 2023-10-26 DIAGNOSIS — I129 Hypertensive chronic kidney disease with stage 1 through stage 4 chronic kidney disease, or unspecified chronic kidney disease: Secondary | ICD-10-CM | POA: Diagnosis not present

## 2023-10-26 DIAGNOSIS — F03A0 Unspecified dementia, mild, without behavioral disturbance, psychotic disturbance, mood disturbance, and anxiety: Secondary | ICD-10-CM | POA: Diagnosis not present

## 2023-11-05 DIAGNOSIS — F4322 Adjustment disorder with anxiety: Secondary | ICD-10-CM | POA: Diagnosis not present

## 2023-11-15 DIAGNOSIS — E1122 Type 2 diabetes mellitus with diabetic chronic kidney disease: Secondary | ICD-10-CM | POA: Diagnosis not present

## 2023-11-15 DIAGNOSIS — F03A0 Unspecified dementia, mild, without behavioral disturbance, psychotic disturbance, mood disturbance, and anxiety: Secondary | ICD-10-CM | POA: Diagnosis not present

## 2023-11-15 DIAGNOSIS — I129 Hypertensive chronic kidney disease with stage 1 through stage 4 chronic kidney disease, or unspecified chronic kidney disease: Secondary | ICD-10-CM | POA: Diagnosis not present

## 2023-11-17 DIAGNOSIS — E1151 Type 2 diabetes mellitus with diabetic peripheral angiopathy without gangrene: Secondary | ICD-10-CM | POA: Diagnosis not present

## 2023-11-17 DIAGNOSIS — Z794 Long term (current) use of insulin: Secondary | ICD-10-CM | POA: Diagnosis not present

## 2023-11-17 DIAGNOSIS — L602 Onychogryphosis: Secondary | ICD-10-CM | POA: Diagnosis not present

## 2023-11-19 DIAGNOSIS — F4322 Adjustment disorder with anxiety: Secondary | ICD-10-CM | POA: Diagnosis not present

## 2023-12-03 DIAGNOSIS — F4322 Adjustment disorder with anxiety: Secondary | ICD-10-CM | POA: Diagnosis not present

## 2023-12-14 DIAGNOSIS — F03A0 Unspecified dementia, mild, without behavioral disturbance, psychotic disturbance, mood disturbance, and anxiety: Secondary | ICD-10-CM | POA: Diagnosis not present

## 2023-12-14 DIAGNOSIS — E1122 Type 2 diabetes mellitus with diabetic chronic kidney disease: Secondary | ICD-10-CM | POA: Diagnosis not present

## 2023-12-14 DIAGNOSIS — I129 Hypertensive chronic kidney disease with stage 1 through stage 4 chronic kidney disease, or unspecified chronic kidney disease: Secondary | ICD-10-CM | POA: Diagnosis not present

## 2023-12-20 DIAGNOSIS — K118 Other diseases of salivary glands: Secondary | ICD-10-CM | POA: Diagnosis not present

## 2023-12-24 DIAGNOSIS — F4322 Adjustment disorder with anxiety: Secondary | ICD-10-CM | POA: Diagnosis not present

## 2024-01-06 DIAGNOSIS — E119 Type 2 diabetes mellitus without complications: Secondary | ICD-10-CM | POA: Diagnosis not present

## 2024-01-06 DIAGNOSIS — D649 Anemia, unspecified: Secondary | ICD-10-CM | POA: Diagnosis not present

## 2024-01-16 DIAGNOSIS — I129 Hypertensive chronic kidney disease with stage 1 through stage 4 chronic kidney disease, or unspecified chronic kidney disease: Secondary | ICD-10-CM | POA: Diagnosis not present

## 2024-01-18 DIAGNOSIS — N189 Chronic kidney disease, unspecified: Secondary | ICD-10-CM | POA: Diagnosis not present

## 2024-01-18 DIAGNOSIS — E1122 Type 2 diabetes mellitus with diabetic chronic kidney disease: Secondary | ICD-10-CM | POA: Diagnosis not present

## 2024-01-18 DIAGNOSIS — F03A0 Unspecified dementia, mild, without behavioral disturbance, psychotic disturbance, mood disturbance, and anxiety: Secondary | ICD-10-CM | POA: Diagnosis not present

## 2024-01-18 DIAGNOSIS — I129 Hypertensive chronic kidney disease with stage 1 through stage 4 chronic kidney disease, or unspecified chronic kidney disease: Secondary | ICD-10-CM | POA: Diagnosis not present

## 2024-01-21 DIAGNOSIS — F4322 Adjustment disorder with anxiety: Secondary | ICD-10-CM | POA: Diagnosis not present

## 2024-02-04 DIAGNOSIS — F4322 Adjustment disorder with anxiety: Secondary | ICD-10-CM | POA: Diagnosis not present

## 2024-02-15 DIAGNOSIS — N189 Chronic kidney disease, unspecified: Secondary | ICD-10-CM | POA: Diagnosis not present

## 2024-02-15 DIAGNOSIS — D631 Anemia in chronic kidney disease: Secondary | ICD-10-CM | POA: Diagnosis not present

## 2024-02-15 DIAGNOSIS — I129 Hypertensive chronic kidney disease with stage 1 through stage 4 chronic kidney disease, or unspecified chronic kidney disease: Secondary | ICD-10-CM | POA: Diagnosis not present

## 2024-02-15 DIAGNOSIS — E1122 Type 2 diabetes mellitus with diabetic chronic kidney disease: Secondary | ICD-10-CM | POA: Diagnosis not present

## 2024-03-02 DIAGNOSIS — F4322 Adjustment disorder with anxiety: Secondary | ICD-10-CM | POA: Diagnosis not present

## 2024-03-14 DIAGNOSIS — D631 Anemia in chronic kidney disease: Secondary | ICD-10-CM | POA: Diagnosis not present

## 2024-03-14 DIAGNOSIS — N189 Chronic kidney disease, unspecified: Secondary | ICD-10-CM | POA: Diagnosis not present

## 2024-03-14 DIAGNOSIS — E1122 Type 2 diabetes mellitus with diabetic chronic kidney disease: Secondary | ICD-10-CM | POA: Diagnosis not present

## 2024-03-14 DIAGNOSIS — I129 Hypertensive chronic kidney disease with stage 1 through stage 4 chronic kidney disease, or unspecified chronic kidney disease: Secondary | ICD-10-CM | POA: Diagnosis not present

## 2024-03-20 DIAGNOSIS — I129 Hypertensive chronic kidney disease with stage 1 through stage 4 chronic kidney disease, or unspecified chronic kidney disease: Secondary | ICD-10-CM | POA: Diagnosis not present

## 2024-03-20 DIAGNOSIS — M1990 Unspecified osteoarthritis, unspecified site: Secondary | ICD-10-CM | POA: Diagnosis not present

## 2024-03-20 DIAGNOSIS — Z96653 Presence of artificial knee joint, bilateral: Secondary | ICD-10-CM | POA: Diagnosis not present

## 2024-03-20 DIAGNOSIS — M9973 Connective tissue and disc stenosis of intervertebral foramina of lumbar region: Secondary | ICD-10-CM | POA: Diagnosis not present

## 2024-03-22 DIAGNOSIS — H8110 Benign paroxysmal vertigo, unspecified ear: Secondary | ICD-10-CM | POA: Diagnosis not present

## 2024-03-22 DIAGNOSIS — I129 Hypertensive chronic kidney disease with stage 1 through stage 4 chronic kidney disease, or unspecified chronic kidney disease: Secondary | ICD-10-CM | POA: Diagnosis not present

## 2024-03-22 DIAGNOSIS — F03A0 Unspecified dementia, mild, without behavioral disturbance, psychotic disturbance, mood disturbance, and anxiety: Secondary | ICD-10-CM | POA: Diagnosis not present

## 2024-04-07 DIAGNOSIS — F4322 Adjustment disorder with anxiety: Secondary | ICD-10-CM | POA: Diagnosis not present

## 2024-04-09 DIAGNOSIS — I10 Essential (primary) hypertension: Secondary | ICD-10-CM | POA: Diagnosis not present

## 2024-04-09 DIAGNOSIS — I129 Hypertensive chronic kidney disease with stage 1 through stage 4 chronic kidney disease, or unspecified chronic kidney disease: Secondary | ICD-10-CM | POA: Diagnosis not present

## 2024-04-09 DIAGNOSIS — H8110 Benign paroxysmal vertigo, unspecified ear: Secondary | ICD-10-CM | POA: Diagnosis not present

## 2024-04-09 DIAGNOSIS — F03A0 Unspecified dementia, mild, without behavioral disturbance, psychotic disturbance, mood disturbance, and anxiety: Secondary | ICD-10-CM | POA: Diagnosis not present

## 2024-04-13 DIAGNOSIS — E039 Hypothyroidism, unspecified: Secondary | ICD-10-CM | POA: Diagnosis not present

## 2024-04-13 DIAGNOSIS — K118 Other diseases of salivary glands: Secondary | ICD-10-CM | POA: Diagnosis not present

## 2024-04-17 DIAGNOSIS — F0280 Dementia in other diseases classified elsewhere without behavioral disturbance: Secondary | ICD-10-CM | POA: Diagnosis not present

## 2024-04-17 DIAGNOSIS — I129 Hypertensive chronic kidney disease with stage 1 through stage 4 chronic kidney disease, or unspecified chronic kidney disease: Secondary | ICD-10-CM | POA: Diagnosis not present

## 2024-04-17 DIAGNOSIS — E1122 Type 2 diabetes mellitus with diabetic chronic kidney disease: Secondary | ICD-10-CM | POA: Diagnosis not present

## 2024-05-12 DIAGNOSIS — I129 Hypertensive chronic kidney disease with stage 1 through stage 4 chronic kidney disease, or unspecified chronic kidney disease: Secondary | ICD-10-CM | POA: Diagnosis not present

## 2024-05-12 DIAGNOSIS — E1122 Type 2 diabetes mellitus with diabetic chronic kidney disease: Secondary | ICD-10-CM | POA: Diagnosis not present

## 2024-05-12 DIAGNOSIS — F02811 Dementia in other diseases classified elsewhere, unspecified severity, with agitation: Secondary | ICD-10-CM | POA: Diagnosis not present

## 2024-05-15 DIAGNOSIS — Z96642 Presence of left artificial hip joint: Secondary | ICD-10-CM | POA: Diagnosis not present

## 2024-05-15 DIAGNOSIS — Z96649 Presence of unspecified artificial hip joint: Secondary | ICD-10-CM | POA: Diagnosis not present

## 2024-05-15 DIAGNOSIS — M978XXA Periprosthetic fracture around other internal prosthetic joint, initial encounter: Secondary | ICD-10-CM | POA: Diagnosis not present

## 2024-05-16 DIAGNOSIS — E1122 Type 2 diabetes mellitus with diabetic chronic kidney disease: Secondary | ICD-10-CM | POA: Diagnosis not present

## 2024-06-09 DIAGNOSIS — D631 Anemia in chronic kidney disease: Secondary | ICD-10-CM | POA: Diagnosis not present

## 2024-06-09 DIAGNOSIS — E1122 Type 2 diabetes mellitus with diabetic chronic kidney disease: Secondary | ICD-10-CM | POA: Diagnosis not present

## 2024-06-09 DIAGNOSIS — N183 Chronic kidney disease, stage 3 unspecified: Secondary | ICD-10-CM | POA: Diagnosis not present

## 2024-06-09 DIAGNOSIS — I129 Hypertensive chronic kidney disease with stage 1 through stage 4 chronic kidney disease, or unspecified chronic kidney disease: Secondary | ICD-10-CM | POA: Diagnosis not present

## 2024-07-10 DIAGNOSIS — I129 Hypertensive chronic kidney disease with stage 1 through stage 4 chronic kidney disease, or unspecified chronic kidney disease: Secondary | ICD-10-CM | POA: Diagnosis not present

## 2024-07-10 DIAGNOSIS — E1122 Type 2 diabetes mellitus with diabetic chronic kidney disease: Secondary | ICD-10-CM | POA: Diagnosis not present

## 2024-07-10 DIAGNOSIS — D631 Anemia in chronic kidney disease: Secondary | ICD-10-CM | POA: Diagnosis not present

## 2024-07-10 DIAGNOSIS — N183 Chronic kidney disease, stage 3 unspecified: Secondary | ICD-10-CM | POA: Diagnosis not present

## 2024-07-21 DIAGNOSIS — H524 Presbyopia: Secondary | ICD-10-CM | POA: Diagnosis not present

## 2024-07-21 DIAGNOSIS — E119 Type 2 diabetes mellitus without complications: Secondary | ICD-10-CM | POA: Diagnosis not present

## 2024-07-21 DIAGNOSIS — H2513 Age-related nuclear cataract, bilateral: Secondary | ICD-10-CM | POA: Diagnosis not present
